# Patient Record
Sex: Male | Born: 1975 | Race: White | Hispanic: No | Marital: Single
Health system: Southern US, Academic
[De-identification: ages and names within clinical notes are randomized; demographics above are authoritative.]

## PROBLEM LIST (undated history)

## (undated) ENCOUNTER — Emergency Department: Disposition: A | Discharge status: Other Acute Inpatient Hospital | Payer: Medicare Other

## (undated) DIAGNOSIS — F909 Attention-deficit hyperactivity disorder, unspecified type: Secondary | ICD-10-CM

## (undated) DIAGNOSIS — M1990 Unspecified osteoarthritis, unspecified site: Secondary | ICD-10-CM

## (undated) DIAGNOSIS — F1910 Other psychoactive substance abuse, uncomplicated: Secondary | ICD-10-CM

## (undated) DIAGNOSIS — F419 Anxiety disorder, unspecified: Secondary | ICD-10-CM

## (undated) DIAGNOSIS — F445 Conversion disorder with seizures or convulsions: Secondary | ICD-10-CM

## (undated) DIAGNOSIS — F32A Depression, unspecified: Secondary | ICD-10-CM

## (undated) DIAGNOSIS — F329 Major depressive disorder, single episode, unspecified: Secondary | ICD-10-CM

## (undated) DIAGNOSIS — R569 Unspecified convulsions: Secondary | ICD-10-CM

## (undated) DIAGNOSIS — K279 Peptic ulcer, site unspecified, unspecified as acute or chronic, without hemorrhage or perforation: Principal | ICD-10-CM

## (undated) DIAGNOSIS — F4310 Post-traumatic stress disorder, unspecified: Secondary | ICD-10-CM

## (undated) DIAGNOSIS — F319 Bipolar disorder, unspecified: Secondary | ICD-10-CM

## (undated) DIAGNOSIS — K219 Gastro-esophageal reflux disease without esophagitis: Secondary | ICD-10-CM

## (undated) DIAGNOSIS — M9390 Osteochondropathy, unspecified of unspecified site: Secondary | ICD-10-CM

## (undated) DIAGNOSIS — M549 Dorsalgia, unspecified: Secondary | ICD-10-CM

## (undated) DIAGNOSIS — J449 Chronic obstructive pulmonary disease, unspecified: Secondary | ICD-10-CM

## (undated) HISTORY — PX: VASECTOMY: SHX75

## (undated) HISTORY — DX: Attention-deficit hyperactivity disorder, unspecified type: F90.9

## (undated) HISTORY — PX: TONSILLECTOMY: SUR1361

## (undated) HISTORY — PX: ANKLE SURGERY: SHX546

## (undated) HISTORY — DX: Bipolar disorder, unspecified: F31.9

## (undated) HISTORY — DX: Peptic ulcer, site unspecified, unspecified as acute or chronic, without hemorrhage or perforation: K27.9

## (undated) HISTORY — PX: ROTATOR CUFF REPAIR: SHX139

## (undated) HISTORY — PX: HERNIA REPAIR: SHX51

## (undated) HISTORY — PX: HX HERNIA REPAIR: SHX51

## (undated) HISTORY — DX: Depression, unspecified: F32.A

## (undated) HISTORY — DX: Dorsalgia, unspecified: M54.9

## (undated) HISTORY — DX: Chronic obstructive pulmonary disease, unspecified: J44.9

## (undated) HISTORY — DX: Anxiety disorder, unspecified: F41.9

## (undated) HISTORY — DX: Osteochondropathy, unspecified of unspecified site: M93.90

## (undated) HISTORY — PX: HX LUMBAR SPINE SURGERY: 2100001196

## (undated) HISTORY — PX: HX ROTATOR CUFF REPAIR: SHX139

---

## 2001-05-30 ENCOUNTER — Emergency Department (HOSPITAL_COMMUNITY): Payer: Self-pay | Admitting: EXTERNAL

## 2008-04-28 ENCOUNTER — Emergency Department (HOSPITAL_COMMUNITY): Admission: EM | Admit: 2008-04-28 | Discharge: 2008-04-28 | Payer: Self-pay | Admitting: Emergency Medicine

## 2008-05-21 ENCOUNTER — Emergency Department (HOSPITAL_COMMUNITY): Admission: EM | Admit: 2008-05-21 | Discharge: 2008-05-21 | Payer: Self-pay | Admitting: Emergency Medicine

## 2008-11-15 ENCOUNTER — Other Ambulatory Visit: Payer: Self-pay | Admitting: Emergency Medicine

## 2008-11-15 ENCOUNTER — Ambulatory Visit: Payer: Self-pay | Admitting: Psychiatry

## 2008-11-16 ENCOUNTER — Inpatient Hospital Stay (HOSPITAL_COMMUNITY): Admission: RE | Admit: 2008-11-16 | Discharge: 2008-11-18 | Payer: Self-pay | Admitting: Psychiatry

## 2009-01-24 ENCOUNTER — Emergency Department (HOSPITAL_COMMUNITY): Admission: EM | Admit: 2009-01-24 | Discharge: 2009-01-24 | Payer: Self-pay | Admitting: Emergency Medicine

## 2009-02-03 ENCOUNTER — Emergency Department (HOSPITAL_COMMUNITY): Admission: EM | Admit: 2009-02-03 | Discharge: 2009-02-03 | Payer: Self-pay | Admitting: Emergency Medicine

## 2010-05-09 ENCOUNTER — Emergency Department (HOSPITAL_COMMUNITY)
Admission: EM | Admit: 2010-05-09 | Discharge: 2010-05-09 | Payer: Self-pay | Source: Home / Self Care | Admitting: Emergency Medicine

## 2010-05-11 ENCOUNTER — Emergency Department (HOSPITAL_COMMUNITY)
Admission: EM | Admit: 2010-05-11 | Discharge: 2010-05-11 | Payer: Self-pay | Source: Home / Self Care | Admitting: Emergency Medicine

## 2010-06-06 ENCOUNTER — Emergency Department (HOSPITAL_COMMUNITY)
Admission: EM | Admit: 2010-06-06 | Discharge: 2010-06-06 | Disposition: A | Discharge status: Home / Self Care | Payer: Medicare Other | Attending: Emergency Medicine | Admitting: Emergency Medicine

## 2010-06-06 ENCOUNTER — Emergency Department (HOSPITAL_COMMUNITY): Payer: Medicare Other

## 2010-06-06 ENCOUNTER — Encounter (HOSPITAL_COMMUNITY): Payer: Self-pay | Admitting: Radiology

## 2010-06-06 DIAGNOSIS — M25519 Pain in unspecified shoulder: Secondary | ICD-10-CM | POA: Insufficient documentation

## 2010-06-06 DIAGNOSIS — F341 Dysthymic disorder: Secondary | ICD-10-CM | POA: Insufficient documentation

## 2010-06-06 DIAGNOSIS — Z79899 Other long term (current) drug therapy: Secondary | ICD-10-CM | POA: Insufficient documentation

## 2010-08-06 LAB — URINALYSIS, ROUTINE W REFLEX MICROSCOPIC
Nitrite: NEGATIVE
Specific Gravity, Urine: 1.01 (ref 1.005–1.030)
Urobilinogen, UA: 0.2 mg/dL (ref 0.0–1.0)

## 2010-08-06 LAB — BASIC METABOLIC PANEL
CO2: 27 mEq/L (ref 19–32)
Calcium: 9.1 mg/dL (ref 8.4–10.5)
Creatinine, Ser: 0.93 mg/dL (ref 0.4–1.5)
GFR calc Af Amer: >60 mL/min (ref >60)
GFR calc non Af Amer: >60 mL/min (ref >60)
Glucose, Bld: 93 mg/dL (ref 70–99)

## 2010-08-06 LAB — CBC
MCHC: 34.9 g/dL (ref 30.0–36.0)
RBC: 4.95 MIL/uL (ref 4.22–5.81)
RDW: 14.3 % (ref 11.5–15.5)

## 2010-08-06 LAB — DIFFERENTIAL
Basophils Absolute: 0.1 10*3/uL (ref 0.0–0.1)
Basophils Relative: 1 % (ref 0–1)
Neutro Abs: 5.7 10*3/uL (ref 1.7–7.7)
Neutrophils Relative %: 46 % (ref 43–77)

## 2010-08-06 LAB — RAPID URINE DRUG SCREEN, HOSP PERFORMED
Barbiturates: NOT DETECTED
Opiates: POSITIVE — AB

## 2010-10-21 ENCOUNTER — Emergency Department (HOSPITAL_COMMUNITY): Payer: Medicare Other

## 2010-10-21 ENCOUNTER — Emergency Department (HOSPITAL_COMMUNITY)
Admission: EM | Admit: 2010-10-21 | Discharge: 2010-10-21 | Disposition: A | Discharge status: Home / Self Care | Payer: Medicare Other | Attending: Emergency Medicine | Admitting: Emergency Medicine

## 2010-10-21 DIAGNOSIS — F411 Generalized anxiety disorder: Secondary | ICD-10-CM | POA: Insufficient documentation

## 2010-10-21 DIAGNOSIS — R071 Chest pain on breathing: Secondary | ICD-10-CM | POA: Insufficient documentation

## 2010-10-21 DIAGNOSIS — R55 Syncope and collapse: Secondary | ICD-10-CM | POA: Insufficient documentation

## 2010-10-21 DIAGNOSIS — F1910 Other psychoactive substance abuse, uncomplicated: Secondary | ICD-10-CM | POA: Insufficient documentation

## 2010-10-21 DIAGNOSIS — F329 Major depressive disorder, single episode, unspecified: Secondary | ICD-10-CM | POA: Insufficient documentation

## 2010-10-21 DIAGNOSIS — F3289 Other specified depressive episodes: Secondary | ICD-10-CM | POA: Insufficient documentation

## 2010-10-21 LAB — COMPREHENSIVE METABOLIC PANEL
Alkaline Phosphatase: 68 U/L (ref 39–117)
BUN: 14 mg/dL (ref 6–23)
CO2: 28 mEq/L (ref 19–32)
Chloride: 100 mEq/L (ref 96–112)
GFR calc Af Amer: >60 mL/min (ref >60)
GFR calc non Af Amer: >60 mL/min (ref >60)
Glucose, Bld: 88 mg/dL (ref 70–99)
Potassium: 3.7 mEq/L (ref 3.5–5.1)
Total Bilirubin: 0.2 mg/dL — ABNORMAL LOW (ref 0.3–1.2)
Total Protein: 7.4 g/dL (ref 6.0–8.3)

## 2010-10-21 LAB — CBC
HCT: 40.8 % (ref 39.0–52.0)
Hemoglobin: 14.3 g/dL (ref 13.0–17.0)
MCHC: 35 g/dL (ref 30.0–36.0)

## 2010-10-21 LAB — DIFFERENTIAL
Basophils Absolute: 0.1 10*3/uL (ref 0.0–0.1)
Lymphocytes Relative: 38 % (ref 12–46)
Monocytes Absolute: 0.9 10*3/uL (ref 0.1–1.0)
Neutro Abs: 6.1 10*3/uL (ref 1.7–7.7)

## 2010-10-21 LAB — URINALYSIS, ROUTINE W REFLEX MICROSCOPIC
Bilirubin Urine: NEGATIVE
Hgb urine dipstick: NEGATIVE
Nitrite: NEGATIVE
Specific Gravity, Urine: 1.015 (ref 1.005–1.030)
pH: 7 (ref 5.0–8.0)

## 2010-10-21 LAB — CK: Total CK: 156 U/L (ref 7–232)

## 2010-12-22 ENCOUNTER — Encounter (HOSPITAL_COMMUNITY): Payer: Self-pay

## 2010-12-22 ENCOUNTER — Emergency Department (HOSPITAL_COMMUNITY)
Admission: EM | Admit: 2010-12-22 | Discharge: 2010-12-22 | Disposition: A | Discharge status: Home / Self Care | Payer: Medicare Other | Attending: Emergency Medicine | Admitting: Emergency Medicine

## 2010-12-22 DIAGNOSIS — K859 Acute pancreatitis without necrosis or infection, unspecified: Secondary | ICD-10-CM | POA: Insufficient documentation

## 2010-12-22 LAB — DIFFERENTIAL
Basophils Relative: 1 % (ref 0–1)
Eosinophils Absolute: 0.4 10*3/uL (ref 0.0–0.7)
Neutrophils Relative %: 40 % — ABNORMAL LOW (ref 43–77)

## 2010-12-22 LAB — COMPREHENSIVE METABOLIC PANEL
ALT: 11 U/L (ref 0–53)
Albumin: 4.4 g/dL (ref 3.5–5.2)
Alkaline Phosphatase: 60 U/L (ref 39–117)
Calcium: 9.3 mg/dL (ref 8.4–10.5)
Potassium: 3.6 mEq/L (ref 3.5–5.1)
Sodium: 139 mEq/L (ref 135–145)
Total Protein: 7.3 g/dL (ref 6.0–8.3)

## 2010-12-22 LAB — CBC
MCH: 31.3 pg (ref 26.0–34.0)
MCHC: 34.6 g/dL (ref 30.0–36.0)
Platelets: 288 10*3/uL (ref 150–400)
RDW: 13.3 % (ref 11.5–15.5)

## 2010-12-22 LAB — URINALYSIS, ROUTINE W REFLEX MICROSCOPIC
Glucose, UA: NEGATIVE mg/dL
Leukocytes, UA: NEGATIVE
Specific Gravity, Urine: >1.03 — ABNORMAL HIGH (ref 1.005–1.030)
pH: 6 (ref 5.0–8.0)

## 2010-12-22 MED ORDER — ONDANSETRON HCL 4 MG/2ML IJ SOLN
4.0000 mg | Freq: Once | INTRAMUSCULAR | Status: AC
  Administered 2010-12-22: 4 mg via INTRAVENOUS
  Filled 2010-12-22: qty 2

## 2010-12-22 MED ORDER — HYDROMORPHONE HCL 1 MG/ML IJ SOLN: 1.0000 mg | Freq: Once | INTRAMUSCULAR | Status: DC

## 2010-12-22 MED ORDER — SODIUM CHLORIDE 0.9 % IV SOLN
Freq: Once | INTRAVENOUS | Status: AC
  Administered 2010-12-22: 12:00:00 via INTRAVENOUS

## 2010-12-22 MED ORDER — OXYCODONE-ACETAMINOPHEN 5-325 MG PO TABS: 2.0000 | ORAL_TABLET | ORAL | Status: DC | PRN

## 2010-12-22 MED ORDER — FAMOTIDINE IN NACL 20-0.9 MG/50ML-% IV SOLN
20.0000 mg | Freq: Once | INTRAVENOUS | Status: AC
  Administered 2010-12-22: 20 mg via INTRAVENOUS
  Filled 2010-12-22: qty 50

## 2010-12-22 MED ORDER — ONDANSETRON HCL 4 MG PO TABS: 4.0000 mg | ORAL_TABLET | Freq: Four times a day (QID) | ORAL | Status: AC

## 2010-12-22 MED ORDER — HYDROMORPHONE HCL 1 MG/ML IJ SOLN
1.0000 mg | Freq: Once | INTRAMUSCULAR | Status: AC
  Administered 2010-12-22: 1 mg via INTRAVENOUS
  Filled 2010-12-22: qty 1

## 2010-12-22 MED ORDER — HYDROMORPHONE HCL 1 MG/ML IJ SOLN
INTRAMUSCULAR | Status: AC
  Administered 2010-12-22: 1 mg
  Filled 2010-12-22: qty 1

## 2010-12-22 NOTE — ED Notes (Signed)
Pt c/o left upper quad pain since yesterday.  REports has vomited x 2 yesterday, none today.  Denies diarrhea, lbm was last night and was normal per pt.  Denies urinary symptoms.

## 2010-12-22 NOTE — ED Provider Notes (Signed)
Scribed for Toy Baker, MD, the patient was seen in room APA16A/APA16A . This chart was scribed by Ellie Lunch. This patient's care was started at 11:36 AM.   CSN: [id number] Arrival date & time: 12/22/2010 11:18 AM  Chief Complaint  Patient presents with  . Abdominal Pain   Patient is a 35 y.o. male presenting with abdominal pain.  Abdominal Pain The primary symptoms of the illness include abdominal pain, nausea and vomiting. The primary symptoms of the illness do not include fever, diarrhea or dysuria. The current episode started yesterday. The onset of the illness was sudden. The problem has not changed since onset. The abdominal pain began yesterday. The pain came on suddenly. The abdominal pain has been unchanged since its onset. The abdominal pain is located in the epigastric region. The abdominal pain does not radiate.  Nausea began yesterday.  The vomiting began yesterday.  Symptoms associated with the illness do not include frequency.   Darin Thomas is a 35 y.o. male who presents to the Emergency Department complaining of LUQ abdominal pain starting yesterday. PT was sitting on couch when he began to feel pain in his abdomen and nauseated. He threw up twice. Since onset pain has been constant, but nausea is now resolved. Pt denies fever, diarrhea, dysuria, or increase in frequency. Pt denies eating any strange foods or any recent sick contact. Pt denies a history of similar symptoms.  Pt is a smoker and drinks on special occassions.    History reviewed. No pertinent past medical history.  Past Surgical History  Procedure Date  . Ankle surgery   . Hernia repair   . Rotator cuff repair   . Vasectomy    MEDICATIONS:  Previous Medications   DIVALPROEX (DEPAKOTE) 500 MG 24 HR TABLET    Take 1,500 mg by mouth at bedtime.     NAPROXEN SODIUM (ANAPROX) 220 MG TABLET    Take 220 mg by mouth daily as needed. For pain    NUTRITIONAL SUPPLEMENTS (MELATONIN PO)    Take 1 tablet by  mouth daily as needed. For sleep    PAROXETINE (PAXIL) 40 MG TABLET    Take 40 mg by mouth at bedtime.     ZOLPIDEM (AMBIEN) 10 MG TABLET    Take 10 mg by mouth at bedtime as needed. For sleep      ALLERGIES:  Allergies as of 12/22/2010 - Review Complete 12/22/2010  Allergen Reaction Noted  . Latex  12/22/2010  . Tape  12/22/2010  . Ultram (tramadol hcl)  12/22/2010      Family History  Problem Relation Age of Onset  . Cancer Mother   . Cancer Father   . Stroke Father     History  Substance Use Topics  . Smoking status: Current Everyday Smoker  . Smokeless tobacco: Not on file  . Alcohol Use: No      Review of Systems  Constitutional: Negative for fever.  Gastrointestinal: Positive for nausea, vomiting and abdominal pain. Negative for diarrhea.  Genitourinary: Negative for dysuria, frequency and difficulty urinating.  All other systems reviewed and are negative.    Physical Exam  BP 134/79  Pulse 71  Temp(Src) 98 F (36.7 C) (Oral)  Resp 18  Ht 6' (1.829 m)  Wt 194 lb (87.998 kg)  BMI 26.31 kg/m2  SpO2 97%  Physical Exam  Nursing note and vitals reviewed. Constitutional: He is oriented to person, place, and time. He appears well-developed and well-nourished.  HENT:  Head:  Normocephalic and atraumatic.  Eyes: EOM are normal.  Neck: Neck supple.  Cardiovascular: Normal rate, regular rhythm and normal heart sounds.   Pulmonary/Chest: Effort normal and breath sounds normal.  Abdominal: Soft. He exhibits no mass. There is tenderness (Pain to palpation LUQ, LLQ). There is no rebound and no guarding.  Musculoskeletal: Normal range of motion.  Neurological: He is alert and oriented to person, place, and time.  Skin: Skin is warm and dry.  Psychiatric: He has a normal mood and affect. Judgment normal.   OTHER DATA REVIEWED: Nursing notes, vital signs, and past medical records reviewed.   DIAGNOSTIC STUDIES: Oxygen Saturation is 97% on room air, normal by my  interpretation.     LABS / RADIOLOGY:  Results for orders placed during the hospital encounter of 12/22/10  CBC      Component Value Range   WBC 9.0  4.0 - 10.5 (K/uL)   RBC 4.51  4.22 - 5.81 (MIL/uL)   Hemoglobin 14.1  13.0 - 17.0 (g/dL)   HCT 16.1  09.6 - 04.5 (%)   MCV 90.2  78.0 - 100.0 (fL)   MCH 31.3  26.0 - 34.0 (pg)   MCHC 34.6  30.0 - 36.0 (g/dL)   RDW 40.9  81.1 - 91.4 (%)   Platelets 288  150 - 400 (K/uL)  DIFFERENTIAL      Component Value Range   Neutrophils Relative 40 (*) 43 - 77 (%)   Neutro Abs 3.6  1.7 - 7.7 (K/uL)   Lymphocytes Relative 49 (*) 12 - 46 (%)   Lymphs Abs 4.4 (*) 0.7 - 4.0 (K/uL)   Monocytes Relative 6  3 - 12 (%)   Monocytes Absolute 0.6  0.1 - 1.0 (K/uL)   Eosinophils Relative 4  0 - 5 (%)   Eosinophils Absolute 0.4  0.0 - 0.7 (K/uL)   Basophils Relative 1  0 - 1 (%)   Basophils Absolute 0.1  0.0 - 0.1 (K/uL)  COMPREHENSIVE METABOLIC PANEL      Component Value Range   Sodium 139  135 - 145 (mEq/L)   Potassium 3.6  3.5 - 5.1 (mEq/L)   Chloride 103  96 - 112 (mEq/L)   CO2 26  19 - 32 (mEq/L)   Glucose, Bld 107 (*) 70 - 99 (mg/dL)   BUN 13  6 - 23 (mg/dL)   Creatinine, Ser 7.82  0.50 - 1.35 (mg/dL)   Calcium 9.3  8.4 - 95.6 (mg/dL)   Total Protein 7.3  6.0 - 8.3 (g/dL)   Albumin 4.4  3.5 - 5.2 (g/dL)   AST 12  0 - 37 (U/L)   ALT 11  0 - 53 (U/L)   Alkaline Phosphatase 60  39 - 117 (U/L)   Total Bilirubin 0.2 (*) 0.3 - 1.2 (mg/dL)   GFR calc non Af Amer >60  >60 (mL/min)   GFR calc Af Amer >60  >60 (mL/min)  URINALYSIS, ROUTINE W REFLEX MICROSCOPIC      Component Value Range   Color, Urine YELLOW  YELLOW    Appearance CLEAR  CLEAR    Specific Gravity, Urine >1.030 (*) 1.005 - 1.030    pH 6.0  5.0 - 8.0    Glucose, UA NEGATIVE  NEGATIVE (mg/dL)   Hgb urine dipstick NEGATIVE  NEGATIVE    Bilirubin Urine SMALL (*) NEGATIVE    Ketones, ur TRACE (*) NEGATIVE (mg/dL)   Protein, ur NEGATIVE  NEGATIVE (mg/dL)   Urobilinogen, UA 0.2  0.0 -  1.0 (mg/dL)   Nitrite NEGATIVE  NEGATIVE    Leukocytes, UA NEGATIVE  NEGATIVE   LIPASE, BLOOD      Component Value Range   Lipase 231 (*) 11 - 59 (U/L)     MDM:  Pt given iv fluids and pain meds, feels better, abd exam repeat and imporved, no acute abd signs noted 2:42 PM Pt offered admission and wants to go home due to his birthday being tomorrow, will return if worse   MEDICATIONS GIVEN IN THE E.D.  Medications  PARoxetine (PAXIL) 40 MG tablet (not administered)  zolpidem (AMBIEN) 10 MG tablet (not administered)  divalproex (DEPAKOTE) 500 MG 24 hr tablet (not administered)  naproxen sodium (ANAPROX) 220 MG tablet (not administered)  Nutritional Supplements (MELATONIN PO) (not administered)  ondansetron (ZOFRAN) injection 4 mg (not administered)  0.9 %  sodium chloride infusion (not administered)  famotidine (PEPCID) IVPB 20 mg (not administered)    DISCHARGE MEDICATIONS: New Prescriptions   No medications on file    Procedures        Toy Baker, MD 12/22/10 1442

## 2010-12-24 ENCOUNTER — Emergency Department (HOSPITAL_COMMUNITY): Payer: Medicare Other

## 2010-12-24 ENCOUNTER — Encounter (HOSPITAL_COMMUNITY): Payer: Self-pay

## 2010-12-24 ENCOUNTER — Emergency Department (HOSPITAL_COMMUNITY)
Admission: EM | Admit: 2010-12-24 | Discharge: 2010-12-24 | Disposition: A | Discharge status: Home / Self Care | Payer: Medicare Other | Attending: Emergency Medicine | Admitting: Emergency Medicine

## 2010-12-24 DIAGNOSIS — K859 Acute pancreatitis without necrosis or infection, unspecified: Secondary | ICD-10-CM

## 2010-12-24 DIAGNOSIS — Z91040 Latex allergy status: Secondary | ICD-10-CM | POA: Insufficient documentation

## 2010-12-24 DIAGNOSIS — F319 Bipolar disorder, unspecified: Secondary | ICD-10-CM | POA: Insufficient documentation

## 2010-12-24 DIAGNOSIS — R21 Rash and other nonspecific skin eruption: Secondary | ICD-10-CM | POA: Insufficient documentation

## 2010-12-24 DIAGNOSIS — F17200 Nicotine dependence, unspecified, uncomplicated: Secondary | ICD-10-CM | POA: Insufficient documentation

## 2010-12-24 DIAGNOSIS — K2970 Gastritis, unspecified, without bleeding: Secondary | ICD-10-CM | POA: Insufficient documentation

## 2010-12-24 DIAGNOSIS — R1012 Left upper quadrant pain: Secondary | ICD-10-CM

## 2010-12-24 DIAGNOSIS — Z9109 Other allergy status, other than to drugs and biological substances: Secondary | ICD-10-CM | POA: Insufficient documentation

## 2010-12-24 DIAGNOSIS — F341 Dysthymic disorder: Secondary | ICD-10-CM | POA: Insufficient documentation

## 2010-12-24 DIAGNOSIS — R1032 Left lower quadrant pain: Secondary | ICD-10-CM | POA: Insufficient documentation

## 2010-12-24 DIAGNOSIS — K219 Gastro-esophageal reflux disease without esophagitis: Secondary | ICD-10-CM | POA: Insufficient documentation

## 2010-12-24 DIAGNOSIS — K279 Peptic ulcer, site unspecified, unspecified as acute or chronic, without hemorrhage or perforation: Secondary | ICD-10-CM

## 2010-12-24 DIAGNOSIS — Z888 Allergy status to other drugs, medicaments and biological substances status: Secondary | ICD-10-CM | POA: Insufficient documentation

## 2010-12-24 LAB — CBC
HCT: 41.8 % (ref 39.0–52.0)
Hemoglobin: 14.4 g/dL (ref 13.0–17.0)
MCH: 31.2 pg (ref 26.0–34.0)
MCHC: 34.4 g/dL (ref 30.0–36.0)
MCV: 90.5 fL (ref 78.0–100.0)
RDW: 13.1 % (ref 11.5–15.5)

## 2010-12-24 LAB — HEPATIC FUNCTION PANEL
ALT: 7 U/L (ref 0–53)
AST: 12 U/L (ref 0–37)
Bilirubin, Direct: 0.1 mg/dL (ref 0.0–0.3)
Indirect Bilirubin: 0.2 mg/dL — ABNORMAL LOW (ref 0.3–0.9)
Total Bilirubin: 0.3 mg/dL (ref 0.3–1.2)

## 2010-12-24 LAB — DIFFERENTIAL
Basophils Absolute: 0.1 10*3/uL (ref 0.0–0.1)
Basophils Relative: 1 % (ref 0–1)
Eosinophils Relative: 6 % — ABNORMAL HIGH (ref 0–5)
Monocytes Absolute: 0.6 10*3/uL (ref 0.1–1.0)
Monocytes Relative: 6 % (ref 3–12)
Neutro Abs: 4.7 10*3/uL (ref 1.7–7.7)

## 2010-12-24 LAB — BASIC METABOLIC PANEL
BUN: 11 mg/dL (ref 6–23)
Calcium: 9.4 mg/dL (ref 8.4–10.5)
Chloride: 103 mEq/L (ref 96–112)
Creatinine, Ser: 0.81 mg/dL (ref 0.50–1.35)
GFR calc Af Amer: >60 mL/min (ref >60)

## 2010-12-24 LAB — URINALYSIS, ROUTINE W REFLEX MICROSCOPIC
Bilirubin Urine: NEGATIVE
Glucose, UA: NEGATIVE mg/dL
Ketones, ur: NEGATIVE mg/dL
Nitrite: NEGATIVE
Protein, ur: NEGATIVE mg/dL
pH: 7 (ref 5.0–8.0)

## 2010-12-24 MED ORDER — OMEPRAZOLE 20 MG PO CPDR: DELAYED_RELEASE_CAPSULE | ORAL | Status: DC

## 2010-12-24 MED ORDER — PROMETHAZINE HCL 25 MG PO TABS: 25.0000 mg | ORAL_TABLET | Freq: Four times a day (QID) | ORAL | Status: AC | PRN

## 2010-12-24 MED ORDER — MORPHINE SULFATE 4 MG/ML IJ SOLN
INTRAMUSCULAR | Status: AC
  Administered 2010-12-24: 4 mg
  Filled 2010-12-24: qty 1

## 2010-12-24 MED ORDER — IOHEXOL 300 MG/ML  SOLN
100.0000 mL | Freq: Once | INTRAMUSCULAR | Status: AC | PRN
  Administered 2010-12-24: 100 mL via INTRAVENOUS

## 2010-12-24 MED ORDER — ONDANSETRON HCL 4 MG/2ML IJ SOLN
4.0000 mg | INTRAMUSCULAR | Status: DC | PRN
  Administered 2010-12-24: 4 mg via INTRAVENOUS
  Filled 2010-12-24: qty 2

## 2010-12-24 MED ORDER — GI COCKTAIL ~~LOC~~
30.0000 mL | Freq: Once | ORAL | Status: AC
  Administered 2010-12-24: 30 mL via ORAL
  Filled 2010-12-24: qty 30

## 2010-12-24 MED ORDER — SODIUM CHLORIDE 0.9 % IV BOLUS (SEPSIS)
1500.0000 mL | Freq: Once | INTRAVENOUS | Status: AC
  Administered 2010-12-24: 1500 mL via INTRAVENOUS

## 2010-12-24 MED ORDER — OXYCODONE-ACETAMINOPHEN 5-325 MG PO TABS: 2.0000 | ORAL_TABLET | ORAL | Status: DC | PRN

## 2010-12-24 MED ORDER — MORPHINE SULFATE 10 MG/ML IJ SOLN
4.0000 mg | INTRAMUSCULAR | Status: DC | PRN
  Administered 2010-12-24 (×2): 4 mg via INTRAVENOUS
  Filled 2010-12-24 (×2): qty 1

## 2010-12-24 MED ORDER — FAMOTIDINE IN NACL 20-0.9 MG/50ML-% IV SOLN
20.0000 mg | Freq: Once | INTRAVENOUS | Status: AC
  Administered 2010-12-24: 20 mg via INTRAVENOUS
  Filled 2010-12-24: qty 50

## 2010-12-24 MED ORDER — SODIUM CHLORIDE 0.9 % IV SOLN
INTRAVENOUS | Status: DC
  Administered 2010-12-24: 15:00:00 via INTRAVENOUS

## 2010-12-24 NOTE — ED Provider Notes (Signed)
Scribed for Ward Givens, MD, the patient was seen in room APA10/APA10. This chart was scribed by [employer]. The patient's care started at 11:48  CSN: [id number] Arrival date & time: 12/24/2010 11:40 AM  Chief Complaint  Patient presents with  . Abdominal Pain   HPI  Darin Thomas is a 35 y.o. male who presents to the Emergency Department complaining of sharp constant abdominal pain, onset 12/21/2010. Patient localizes pain to the LLQ of the abdomen and states that pain does not radiate to the back. The pain is sharp in character.  Pain is aggravated by sitting up and with pressure from his "belt and pants". Associated symptoms include nausea and vomiting. He threw up 3x yesterday and none today. Patient denies diarrhea, fever. epigastric pain, weakness or dizziness upon standing. Patient has a recent history of similar pain, was seen in the ER 2 days ago and was diagnosed with acute pancreatitis. States he wasn't improved upon discharge but had to attend a family function. Reports smoking 1-1.5PPD and seldom drinks. There are no other associated symptoms and no other alleviating or aggravating factors.     Past Medical History  Diagnosis Date  . Pancreatitis   Depression Anxiety Bipolar     MEDICATIONS:  Previous Medications   DIPHENHYDRAMINE (BENADRYL) 25 MG CAPSULE    Take 25 mg by mouth at bedtime as needed. For sleep    DIVALPROEX (DEPAKOTE) 500 MG 24 HR TABLET    Take 1,500 mg by mouth at bedtime.     NAPROXEN SODIUM (ANAPROX) 220 MG TABLET    Take 220 mg by mouth daily as needed. For pain    NUTRITIONAL SUPPLEMENTS (MELATONIN PO)    Take 1 tablet by mouth at bedtime. For sleep   ONDANSETRON (ZOFRAN) 4 MG TABLET    Take 1 tablet (4 mg total) by mouth every 6 (six) hours.   OXYCODONE-ACETAMINOPHEN (PERCOCET) 5-325 MG PER TABLET    Take 2 tablets by mouth every 4 (four) hours as needed for pain.   PAROXETINE (PAXIL) 40 MG TABLET    Take 40 mg by mouth at bedtime.     ZOLPIDEM  (AMBIEN) 10 MG TABLET    Take 10 mg by mouth at bedtime. For sleep     ALLERGIES:  Allergies as of 12/24/2010 - Review Complete 12/24/2010  Allergen Reaction Noted  . Ultram (tramadol hcl) Other (See Comments) 12/22/2010  . Latex Rash 12/22/2010  . Tape Rash 12/22/2010     Past Surgical History  Procedure Date  . Ankle surgery   . Hernia repair   . Rotator cuff repair   . Vasectomy   Surgeries  Vasectomy, right rotator cuff repair, right ankle fx repair x 3, umbilical hernia repair   Family History  Problem Relation Age of Onset  . Cancer Mother   . Cancer Father   . Stroke Father     History  Substance Use Topics  . Smoking status: Current Everyday Smoker  . Smokeless tobacco: Not on file  . Alcohol Use: No   Lives with [facility]   Review of Systems  All other systems reviewed and are negative.    Physical Exam  BP 142/74  Pulse 77  Temp(Src) 98.1 F (36.7 C) (Oral)  Resp 22  Ht 6' (1.829 m)  Wt 194 lb (87.998 kg)  BMI 26.31 kg/m2  SpO2 100%  Physical Exam  Constitutional: Vital signs are normal. He appears well-developed and well-nourished.  HENT:  Head: Normocephalic and atraumatic.  Right Ear: External ear normal.  Left Ear: External ear normal.  Nose: Nose normal.  Mouth/Throat: Uvula is midline. Mucous membranes are dry. No oropharyngeal exudate or posterior oropharyngeal edema.  Eyes: Conjunctivae, EOM and lids are normal. Pupils are equal, round, and reactive to light. No scleral icterus.  Neck: Normal range of motion. Neck supple.  Cardiovascular: Normal rate, regular rhythm and normal heart sounds.   Pulmonary/Chest: Effort normal and breath sounds normal.  Abdominal: Soft. Normal appearance. He exhibits no distension and no mass. There is tenderness in the left upper quadrant. There is guarding. There is no rigidity, no rebound, no CVA tenderness, no tenderness at McBurney's point and negative Murphy's sign. No hernia.       Pt has small scar above  his umbilicus c/w prior repair  Musculoskeletal:       No acute abnormality seen  Neurological: He is alert. He has normal strength. No cranial nerve deficit or sensory deficit. He displays a negative Romberg sign.  Skin: Skin is warm and dry. Rash noted. No abrasion noted. He is not diaphoretic. There is pallor.  Psychiatric: His speech is normal and behavior is normal. Thought content normal. His affect is blunt.    ED Course  Procedures  OTHER DATA REVIEWED: Nursing notes, vital signs, and past medical records reviewed.    DIAGNOSTIC STUDIES: Oxygen Saturation is 100% on room air, normal by my interpretation.      LABS / RADIOLOGY:  Results for orders placed during the hospital encounter of 12/24/10  CBC      Component Value Range   WBC 10.0  4.0 - 10.5 (K/uL)   RBC 4.62  4.22 - 5.81 (MIL/uL)   Hemoglobin 14.4  13.0 - 17.0 (g/dL)   HCT 40.9  81.1 - 91.4 (%)   MCV 90.5  78.0 - 100.0 (fL)   MCH 31.2  26.0 - 34.0 (pg)   MCHC 34.4  30.0 - 36.0 (g/dL)   RDW 78.2  95.6 - 21.3 (%)   Platelets 274  150 - 400 (K/uL)  DIFFERENTIAL      Component Value Range   Neutrophils Relative 47  43 - 77 (%)   Neutro Abs 4.7  1.7 - 7.7 (K/uL)   Lymphocytes Relative 41  12 - 46 (%)   Lymphs Abs 4.1 (*) 0.7 - 4.0 (K/uL)   Monocytes Relative 6  3 - 12 (%)   Monocytes Absolute 0.6  0.1 - 1.0 (K/uL)   Eosinophils Relative 6 (*) 0 - 5 (%)   Eosinophils Absolute 0.6  0.0 - 0.7 (K/uL)   Basophils Relative 1  0 - 1 (%)   Basophils Absolute 0.1  0.0 - 0.1 (K/uL)  BASIC METABOLIC PANEL      Component Value Range   Sodium 140  135 - 145 (mEq/L)   Potassium 3.7  3.5 - 5.1 (mEq/L)   Chloride 103  96 - 112 (mEq/L)   CO2 29  19 - 32 (mEq/L)   Glucose, Bld 92  70 - 99 (mg/dL)   BUN 11  6 - 23 (mg/dL)   Creatinine, Ser 0.86  0.50 - 1.35 (mg/dL)   Calcium 9.4  8.4 - 57.8 (mg/dL)   GFR calc non Af Amer >60  >60 (mL/min)   GFR calc Af Amer >60  >60 (mL/min)  URINALYSIS, ROUTINE W REFLEX MICROSCOPIC        Component Value Range   Color, Urine STRAW (*) YELLOW    Appearance CLEAR  CLEAR  Specific Gravity, Urine <1.005 (*) 1.005 - 1.030    pH 7.0  5.0 - 8.0    Glucose, UA NEGATIVE  NEGATIVE (mg/dL)   Hgb urine dipstick TRACE (*) NEGATIVE    Bilirubin Urine NEGATIVE  NEGATIVE    Ketones, ur NEGATIVE  NEGATIVE (mg/dL)   Protein, ur NEGATIVE  NEGATIVE (mg/dL)   Urobilinogen, UA 0.2  0.0 - 1.0 (mg/dL)   Nitrite NEGATIVE  NEGATIVE    Leukocytes, UA NEGATIVE  NEGATIVE   HEPATIC FUNCTION PANEL      Component Value Range   Total Protein 7.1  6.0 - 8.3 (g/dL)   Albumin 4.3  3.5 - 5.2 (g/dL)   AST 12  0 - 37 (U/L)   ALT 7  0 - 53 (U/L)   Alkaline Phosphatase 59  39 - 117 (U/L)   Total Bilirubin 0.3  0.3 - 1.2 (mg/dL)   Bilirubin, Direct 0.1  0.0 - 0.3 (mg/dL)   Indirect Bilirubin 0.2 (*) 0.3 - 0.9 (mg/dL)  LIPASE, BLOOD      Component Value Range   Lipase 27  11 - 59 (U/L)  URINE MICROSCOPIC-ADD ON      Component Value Range   RBC / HPF 0-2  <3 (RBC/hpf)     Ct Abdomen Pelvis W Contrast  12/24/2010  *RADIOLOGY REPORT*  Clinical Data: Left lower quadrant abdominal pain.  Elevated lipase. Recently diagnosed pancreatitis.  Nausea and vomiting. Smoker.  CT ABDOMEN AND PELVIS WITH CONTRAST  Technique:  Multidetector CT imaging of the abdomen and pelvis was performed following the standard protocol during bolus administration of intravenous contrast.  Contrast: 100 ml Omnipaque-300  Comparison: None.  Findings: Normal appearing liver, spleen, pancreas, gallbladder, adrenal glands, kidneys, urinary bladder and prostate gland.  No gastrointestinal abnormalities or enlarged lymph nodes.  Normal appearing appendix in the upper right pelvis.  Very minimal dependent atelectasis at both lung bases.  Minimal lumbar and lower thoracic spine degenerative changes.  IMPRESSION: No acute abnormality.  Original Report Authenticated By: Darrol Angel, M.D.   ED COURSE / COORDINATION OF CARE: 12:33 - EDMD  examined patient and ordered the following Orders Placed This Encounter  Procedures  . CBC  . Differential  . Basic metabolic panel  . Urinalysis with microscopic    MDM:    Pt given IV fluids and IV pain and nausea meds. He states his pain is worse then his last ED visit but his lipase has improved and his AP CT is showing no acute changes. Will start on meds for GERD/gastritis.     4:20 PM Pt given IV pepcid and oral GI cocktail and he is drinking and feeling alittle better. Feels he can go home. Have discussed his pancreatitis has improved but he may have PUD from smoking and taking aleve OTC.      Ward Givens, MD 12/24/10 [phone]

## 2010-12-24 NOTE — ED Notes (Signed)
Patient with no complaints at this time. Respirations even and unlabored. Skin warm/dry. Discharge instructions reviewed with patient at this time. Patient given opportunity to voice concerns/ask questions. IV removed per policy and band-aid applied to site. Patient discharged at this time and left Emergency Department with steady gait.  

## 2010-12-24 NOTE — ED Notes (Signed)
MD at bedside. 

## 2010-12-24 NOTE — ED Notes (Signed)
Pt was seen here 2 days ago and was dx with pancreatitis.  Pt was told that he should be admitted, but pt did not want to stay b/c he wanted to be home for his daughters birthday.  Pt was sent home with pain meds but it isn't helping.

## 2010-12-26 ENCOUNTER — Emergency Department (HOSPITAL_COMMUNITY)
Admission: EM | Admit: 2010-12-26 | Discharge: 2010-12-26 | Disposition: A | Discharge status: Home / Self Care | Payer: Medicare Other | Attending: Emergency Medicine | Admitting: Emergency Medicine

## 2010-12-26 ENCOUNTER — Encounter (HOSPITAL_COMMUNITY): Payer: Self-pay

## 2010-12-26 DIAGNOSIS — R1012 Left upper quadrant pain: Secondary | ICD-10-CM | POA: Insufficient documentation

## 2010-12-26 DIAGNOSIS — R112 Nausea with vomiting, unspecified: Secondary | ICD-10-CM | POA: Insufficient documentation

## 2010-12-26 LAB — CBC
HCT: 46.1 % (ref 39.0–52.0)
MCH: 31.5 pg (ref 26.0–34.0)
MCHC: 34.9 g/dL (ref 30.0–36.0)
MCV: 90.2 fL (ref 78.0–100.0)
Platelets: 318 10*3/uL (ref 150–400)
RDW: 13 % (ref 11.5–15.5)

## 2010-12-26 LAB — DIFFERENTIAL
Basophils Absolute: 0.1 10*3/uL (ref 0.0–0.1)
Eosinophils Absolute: 0.6 10*3/uL (ref 0.0–0.7)
Eosinophils Relative: 5 % (ref 0–5)
Monocytes Absolute: 0.9 10*3/uL (ref 0.1–1.0)

## 2010-12-26 LAB — COMPREHENSIVE METABOLIC PANEL
ALT: 11 U/L (ref 0–53)
AST: 15 U/L (ref 0–37)
CO2: 31 mEq/L (ref 19–32)
Calcium: 9.9 mg/dL (ref 8.4–10.5)
Creatinine, Ser: 0.77 mg/dL (ref 0.50–1.35)
GFR calc Af Amer: >60 mL/min (ref >60)
GFR calc non Af Amer: >60 mL/min (ref >60)
Sodium: 140 mEq/L (ref 135–145)
Total Protein: 7.5 g/dL (ref 6.0–8.3)

## 2010-12-26 LAB — URINALYSIS, ROUTINE W REFLEX MICROSCOPIC
Glucose, UA: NEGATIVE mg/dL
Hgb urine dipstick: NEGATIVE
Leukocytes, UA: NEGATIVE
Protein, ur: NEGATIVE mg/dL
Specific Gravity, Urine: 1.015 (ref 1.005–1.030)
Urobilinogen, UA: 0.2 mg/dL (ref 0.0–1.0)

## 2010-12-26 LAB — RAPID URINE DRUG SCREEN, HOSP PERFORMED
Cocaine: NOT DETECTED
Opiates: NOT DETECTED

## 2010-12-26 MED ORDER — PROMETHAZINE HCL 25 MG/ML IJ SOLN
25.0000 mg | Freq: Once | INTRAMUSCULAR | Status: AC
  Administered 2010-12-26: 25 mg via INTRAVENOUS
  Filled 2010-12-26: qty 1

## 2010-12-26 MED ORDER — DICYCLOMINE HCL 10 MG/ML IM SOLN
20.0000 mg | Freq: Once | INTRAMUSCULAR | Status: AC
  Administered 2010-12-26: 20 mg via INTRAMUSCULAR
  Filled 2010-12-26: qty 2

## 2010-12-26 MED ORDER — PROMETHAZINE HCL 25 MG PO TABS: 25.0000 mg | ORAL_TABLET | Freq: Four times a day (QID) | ORAL | Status: AC | PRN

## 2010-12-26 MED ORDER — SODIUM CHLORIDE 0.9 % IV BOLUS (SEPSIS)
1500.0000 mL | Freq: Once | INTRAVENOUS | Status: AC
  Administered 2010-12-26: 1000 mL via INTRAVENOUS

## 2010-12-26 MED ORDER — SODIUM CHLORIDE 0.9 % IV SOLN: INTRAVENOUS | Status: DC

## 2010-12-26 NOTE — ED Notes (Signed)
Complain of abd pain and spitting up blood. States this is the third time here for same

## 2010-12-26 NOTE — ED Provider Notes (Cosign Needed)
Scribed for Ward Givens, MD, the patient was seen in room APA05/APA05 . This chart was scribed by Ellie Lunch. This patient's care was started at 3:09 PM.   CSN: [id number] Arrival date & time: 12/26/2010  2:17 PM  Chief Complaint  Patient presents with  . Abdominal Pain   HPI Darin Thomas is a 35 y.o. male who presents to the Emergency Department complaining of LUQ and LLQ abdominal pain for the past 4 days . Pt has been seen in ED 2 previous times for the same complaint . Pt reports last night around 00:30 he started to dry heave and had some streaks of blood and small amounts of blood. Pt reports nausea with gagging,  some weakness, and loss of apprettite. Denies dizziness, lightheadedness, cough, diarrhea, and fever. Pt states he is taking Prilosec and Zofran prescribed in previous ED visits with little relief. There are no other associated symptoms and no other alleviating or aggravating factors. Pt relates he has been stressed yesterday reguarding his exwife and his teenage daughter.  This patients first ED visit he had normal labs except for an elevated lipase. The second ED visit his labs had normalized and he had a normal AP CT scan done.     Past Medical History  Diagnosis Date  . Pancreatitis   . Peptic ulcer     Past Surgical History  Procedure Date  . Ankle surgery   . Hernia repair   . Rotator cuff repair   . Vasectomy    MEDICATIONS:  Previous Medications   DIPHENHYDRAMINE (BENADRYL) 25 MG CAPSULE    Take 25 mg by mouth at bedtime as needed. For sleep    DIVALPROEX (DEPAKOTE) 500 MG 24 HR TABLET    Take 1,500 mg by mouth at bedtime.     NUTRITIONAL SUPPLEMENTS (MELATONIN PO)    Take 1 tablet by mouth at bedtime. For sleep   OMEPRAZOLE (PRILOSEC) 20 MG CAPSULE    Take two pills twice a day for the next 2 weeks then once a day.   ONDANSETRON (ZOFRAN) 4 MG TABLET    Take 1 tablet (4 mg total) by mouth every 6 (six) hours.   OXYCODONE-ACETAMINOPHEN (PERCOCET) 5-325 MG  PER TABLET    Take 2 tablets by mouth every 4 (four) hours as needed for pain.   OXYCODONE-ACETAMINOPHEN (PERCOCET) 5-325 MG PER TABLET    Take 2 tablets by mouth every 4 (four) hours as needed for pain.   PAROXETINE (PAXIL) 40 MG TABLET    Take 40 mg by mouth at bedtime.     PROMETHAZINE (PHENERGAN) 25 MG TABLET    Take 1 tablet (25 mg total) by mouth every 6 (six) hours as needed for nausea.   ZOLPIDEM (AMBIEN) 10 MG TABLET    Take 10 mg by mouth at bedtime. For sleep     ALLERGIES:  Allergies as of 12/26/2010 - Review Complete 12/24/2010  Allergen Reaction Noted  . Ultram (tramadol hcl) Other (See Comments) 12/22/2010  . Latex Rash 12/22/2010  . Tape Rash 12/22/2010     Family History  Problem Relation Age of Onset  . Cancer Mother   . Cancer Father   . Stroke Father     History  Substance Use Topics  . Smoking status: Current Everyday Smoker  . Smokeless tobacco: Not on file  . Alcohol Use: No   Pt is unemployed, lives with his mother   Review of Systems  Constitutional: Positive for appetite change (decreased). Negative  for fever.  Respiratory: Negative for cough.   Gastrointestinal: Positive for nausea, vomiting and abdominal pain. Negative for diarrhea.  Neurological: Negative for dizziness and light-headedness.  All other systems reviewed and are negative.    Physical Exam  BP 122/69  Pulse 95  Temp(Src) 97.4 F (36.3 C) (Oral)  Resp 24  Ht 6' (1.829 m)  Wt 197 lb (89.359 kg)  BMI 26.72 kg/m2  SpO2 99%  Physical Exam  Constitutional: He appears well-developed and well-nourished.  HENT:  Head: Normocephalic.  Eyes: Conjunctivae and EOM are normal. Pupils are equal, round, and reactive to light.  Neck: Normal range of motion. Neck supple.  Cardiovascular: Normal rate, regular rhythm and normal heart sounds.   Pulmonary/Chest: Effort normal and breath sounds normal.  Abdominal: Soft. Normal appearance. There is tenderness in the left upper quadrant.  There is no rebound and no guarding.  Musculoskeletal:       Pt has no acute abnormality.   Neurological: He is alert. He has normal strength. No cranial nerve deficit.  Skin: Skin is warm, dry and intact. No rash noted.  Psychiatric: His speech is normal and behavior is normal. Thought content normal. His affect is blunt.    Procedures  OTHER DATA REVIEWED: Nursing notes, vital signs, and past medical records reviewed.   DIAGNOSTIC STUDIES: Oxygen Saturation is 99% on room air, normal by my interpretation.    LABS: Results for orders placed during the hospital encounter of 12/26/10  CBC      Component Value Range   WBC 12.7 (*) 4.0 - 10.5 (K/uL)   RBC 5.11  4.22 - 5.81 (MIL/uL)   Hemoglobin 16.1  13.0 - 17.0 (g/dL)   HCT 16.1  09.6 - 04.5 (%)   MCV 90.2  78.0 - 100.0 (fL)   MCH 31.5  26.0 - 34.0 (pg)   MCHC 34.9  30.0 - 36.0 (g/dL)   RDW 40.9  81.1 - 91.4 (%)   Platelets 318  150 - 400 (K/uL)  DIFFERENTIAL      Component Value Range   Neutrophils Relative 48  43 - 77 (%)   Neutro Abs 6.1  1.7 - 7.7 (K/uL)   Lymphocytes Relative 39  12 - 46 (%)   Lymphs Abs 5.0 (*) 0.7 - 4.0 (K/uL)   Monocytes Relative 7  3 - 12 (%)   Monocytes Absolute 0.9  0.1 - 1.0 (K/uL)   Eosinophils Relative 5  0 - 5 (%)   Eosinophils Absolute 0.6  0.0 - 0.7 (K/uL)   Basophils Relative 1  0 - 1 (%)   Basophils Absolute 0.1  0.0 - 0.1 (K/uL)  COMPREHENSIVE METABOLIC PANEL      Component Value Range   Sodium 140  135 - 145 (mEq/L)   Potassium 3.9  3.5 - 5.1 (mEq/L)   Chloride 99  96 - 112 (mEq/L)   CO2 31  19 - 32 (mEq/L)   Glucose, Bld 94  70 - 99 (mg/dL)   BUN 11  6 - 23 (mg/dL)   Creatinine, Ser 7.82  0.50 - 1.35 (mg/dL)   Calcium 9.9  8.4 - 95.6 (mg/dL)   Total Protein 7.5  6.0 - 8.3 (g/dL)   Albumin 4.5  3.5 - 5.2 (g/dL)   AST 15  0 - 37 (U/L)   ALT 11  0 - 53 (U/L)   Alkaline Phosphatase 62  39 - 117 (U/L)   Total Bilirubin 0.2 (*) 0.3 - 1.2 (mg/dL)   GFR  calc non Af Amer >60  >60  (mL/min)   GFR calc Af Amer >60  >60 (mL/min)  LIPASE, BLOOD      Component Value Range   Lipase 42  11 - 59 (U/L)  URINALYSIS, ROUTINE W REFLEX MICROSCOPIC      Component Value Range   Color, Urine YELLOW  YELLOW    Appearance CLEAR  CLEAR    Specific Gravity, Urine 1.015  1.005 - 1.030    pH 6.5  5.0 - 8.0    Glucose, UA NEGATIVE  NEGATIVE (mg/dL)   Hgb urine dipstick NEGATIVE  NEGATIVE    Bilirubin Urine NEGATIVE  NEGATIVE    Ketones, ur NEGATIVE  NEGATIVE (mg/dL)   Protein, ur NEGATIVE  NEGATIVE (mg/dL)   Urobilinogen, UA 0.2  0.0 - 1.0 (mg/dL)   Nitrite NEGATIVE  NEGATIVE    Leukocytes, UA NEGATIVE  NEGATIVE   URINE RAPID DRUG SCREEN (HOSP PERFORMED)      Component Value Range   Opiates NONE DETECTED  NONE DETECTED    Cocaine NONE DETECTED  NONE DETECTED    Benzodiazepines NONE DETECTED  NONE DETECTED    Amphetamines NONE DETECTED  NONE DETECTED    Tetrahydrocannabinol POSITIVE (*) NONE DETECTED    Barbiturates NONE DETECTED  NONE DETECTED    Labs normal, urine not concentrated, so not dehydrated. His elevated WBC is c/w vomiting/gagging.  ED COURSE / COORDINATION OF CARE: Pt given IV fluids and IM bentyl which he states didn't help his pain, but he was also asking the nurse for food to eat. Have discussed using more of his phenergan, has only used 2 in 2 days to stop the dry heaves.    MDM:  MEDICATIONS GIVEN IN THE E.D.  Medications  0.9 %  sodium chloride infusion (not administered)  promethazine (PHENERGAN) 25 MG tablet (not administered)  sodium chloride 0.9 % bolus 1,500 mL (1000 mL Intravenous Given 12/26/10 1624)  dicyclomine (BENTYL) injection 20 mg (20 mg Intramuscular Given 12/26/10 1600)  promethazine (PHENERGAN) injection 25 mg (25 mg Intravenous Given 12/26/10 1626)    DISCHARGE MEDICATIONS: New Prescriptions   PROMETHAZINE (PHENERGAN) 25 MG TABLET    Take 1 tablet (25 mg total) by mouth every 6 (six) hours as needed for nausea.    I personally  performed the services described in this documentation, which was scribed in my presence. The recorded information has been reviewed and considered. Devoria Albe, MD, FACEP       Ward Givens, MD 12/26/10 [phone]

## 2010-12-26 NOTE — ED Notes (Signed)
Pt given sprite to drink. 

## 2011-01-05 ENCOUNTER — Other Ambulatory Visit: Payer: Self-pay

## 2011-01-05 ENCOUNTER — Encounter (HOSPITAL_COMMUNITY): Payer: Self-pay | Admitting: *Deleted

## 2011-01-05 ENCOUNTER — Emergency Department (HOSPITAL_COMMUNITY)
Admission: EM | Admit: 2011-01-05 | Discharge: 2011-01-05 | Disposition: A | Discharge status: Home / Self Care | Payer: Medicare Other | Attending: Emergency Medicine | Admitting: Emergency Medicine

## 2011-01-05 ENCOUNTER — Emergency Department (HOSPITAL_COMMUNITY): Payer: Medicare Other

## 2011-01-05 DIAGNOSIS — F17200 Nicotine dependence, unspecified, uncomplicated: Secondary | ICD-10-CM | POA: Insufficient documentation

## 2011-01-05 DIAGNOSIS — S93409A Sprain of unspecified ligament of unspecified ankle, initial encounter: Secondary | ICD-10-CM | POA: Insufficient documentation

## 2011-01-05 DIAGNOSIS — T391X1A Poisoning by 4-Aminophenol derivatives, accidental (unintentional), initial encounter: Secondary | ICD-10-CM | POA: Insufficient documentation

## 2011-01-05 DIAGNOSIS — W172XXA Fall into hole, initial encounter: Secondary | ICD-10-CM | POA: Insufficient documentation

## 2011-01-05 NOTE — ED Provider Notes (Signed)
History     CSN: [id number] Arrival date & time: 01/05/2011  6:48 PM Pt seen at 1915 Chief Complaint  Patient presents with  . Drug Overdose   Patient is a 35 y.o. male presenting with Overdose. The history is provided by the patient and a relative.  Drug Overdose This is a new problem. Episode onset: today. The problem has not changed since onset.Pertinent negatives include no chest pain. The symptoms are aggravated by nothing. The symptoms are relieved by nothing.  Pt reports accidental apap overdose He reports he was working outside today, and he stepped in a hole and injured his right ankle He reports h/o chronic ankle pain due to previous injury/surgery He went into house and took about 4-5 tabs of apap at approximately 2pm He did not take any other meds, no etoh use.   He went to sleep, and woke up three hrs later and took several more apap.  He denies any other ingestants.  He denies SI.  He reports he took meds for his pain.  He lives with mother.  She confirms story.  She reports that the bottle was new, and there are only 16 missing.  The dosage was 500mg  apap each.     Past Medical History  Diagnosis Date  . Pancreatitis   . Peptic ulcer     Past Surgical History  Procedure Date  . Ankle surgery   . Hernia repair   . Rotator cuff repair   . Vasectomy     Family History  Problem Relation Age of Onset  . Cancer Mother   . Cancer Father   . Stroke Father     History  Substance Use Topics  . Smoking status: Current Everyday Smoker  . Smokeless tobacco: Not on file  . Alcohol Use: No      Review of Systems  Cardiovascular: Negative for chest pain.  All other systems reviewed and are negative.    Physical Exam  BP 132/85  Pulse 67  Temp(Src) 98.3 F (36.8 C) (Oral)  Resp 20  Ht 6' (1.829 m)  Wt 197 lb (89.359 kg)  BMI 26.72 kg/m2  SpO2 98%  Physical Exam  Constitutional: well developed, well nourished, no distress Head and Face:  normocephalic/atraumatic Eyes: EOMI/PERRL ENMT: mucous membranes moist Neck: supple, no meningeal signs Spine - no CTL tenderness CV: no murmur/rubs/gallops noted Lungs: clear to auscultation bilaterally Abd: soft, nontender Extremities: full ROM noted, pulses normal/equal, tenderness to right lateral malleolus, no deformity, no open skin, right achilles intact, no distal foot tenderness, no right prox fib tenderness No other extremity injury noted Neuro: awake/alert, no distress, appropriate for age, [id number], no lethargy is noted Skin:  Color normal.  Warm Psych: no abnormalities of mood noted  ED Course  Procedures  MDM Nursing notes reviewed and considered in documentation xrays reviewed and considered  ekg done by nursing for possible poisoning   Date: 01/05/2011  Rate: 61  Rhythm: normal sinus rhythm  QRS Axis: normal  Intervals: normal  ST/T Wave abnormalities: normal  Conduction Disutrbances:none  Narrative Interpretation:   Old EKG Reviewed: none available    Pt did not take a toxic amount of apap.  I spoke to Poison center to report this and confirmed that this is not a toxic overdose and he does not require lab testing Pt did have recent rx for percocet per chart, but review of narcotic database shows those were given in august Pt denies intentional overdose, denies SI.  Mother  at bedside, confirms story.      Joya Gaskins, MD 01/05/11 2152

## 2011-01-05 NOTE — ED Notes (Signed)
Pt self ambulated out displayinf proper use of crutches stating no needs

## 2011-01-05 NOTE — ED Notes (Signed)
States he has been taking tylenol since 1400. States he took around 16 pills, states they were 500 mg each

## 2011-01-05 NOTE — ED Notes (Signed)
Pt taken to xray 

## 2011-01-09 ENCOUNTER — Ambulatory Visit (INDEPENDENT_AMBULATORY_CARE_PROVIDER_SITE_OTHER): Payer: Medicare Other | Admitting: Gastroenterology

## 2011-01-09 ENCOUNTER — Encounter: Payer: Self-pay | Admitting: Gastroenterology

## 2011-01-09 VITALS — BP 129/81 | HR 75 | Temp 98.2°F | Ht 72.0 in | Wt 191.8 lb

## 2011-01-09 DIAGNOSIS — K920 Hematemesis: Secondary | ICD-10-CM | POA: Insufficient documentation

## 2011-01-09 DIAGNOSIS — K859 Acute pancreatitis without necrosis or infection, unspecified: Secondary | ICD-10-CM | POA: Insufficient documentation

## 2011-01-09 DIAGNOSIS — R1012 Left upper quadrant pain: Secondary | ICD-10-CM | POA: Insufficient documentation

## 2011-01-09 MED ORDER — OXYCODONE-ACETAMINOPHEN 5-325 MG PO TABS: 1.0000 | ORAL_TABLET | Freq: Four times a day (QID) | ORAL | Status: DC | PRN

## 2011-01-09 NOTE — Assessment & Plan Note (Signed)
LUQ pain associated with n/v (hematemesis) dating back to 12/21/10. Lipase and WBC up initially which would suggest acute pancreatitis. Two days later CT of abd normal. Patient denies significant etoh consumption. GB in situ. No FH of pancreatitis, hypertrigylceridemia. Symptoms persistent three weeks. Lipase now normal.   Recheck labs, check trig level.  EGD for further evaluation of ongoing abd pain and hematemesis (likely M-W tear). EGD with deep sedation given h/o polypharmacy, chronic narcotic therapy.  I have discussed the risks, alternatives, benefits with regards to but not limited to the risk of reaction to medication, bleeding, infection, perforation and the patient is agreeable to proceed. Written consent to be obtained.  Short course of Percocet for pain management. Bland diet.

## 2011-01-09 NOTE — Progress Notes (Signed)
Primary Care Physician:  Forest Gleason, MD, MD  Primary Gastroenterologist:  Roetta Sessions, MD   Chief Complaint  Patient presents with  . Abdominal Pain    luq  . Nausea    HPI:  Darin Thomas is a 35 y.o. male here for further evaluation of abd pain and nausea. Symptoms began 12/21/10. Dry heaves, vomiting small amount of blood. 12/22/10 ED, diagnosed with acute pancreatitis based on lipase above 200. Refused abdmission due to the fact his birthday was the following day and he had plans with his daughter. Returned to ED on 12/24/10, lipase normal. LFTs normal on multiple checks. CT A/P unremarkable. ED MD question possibility of PUD per patient. Started on omeprazole, no noted improvement. Saw PCP on 12/25/10, labs normal including CBC, LFTs, lipase. Started on Cipro/Flagyl, and given Rocephin for unclear reasons. Patient seen again inED on 12/26/10, labs okay. Patient took half of the Flagyl, cipro. Rarely takes Aleve, tylenol. States he has been living off crackers.  Very seldom etoh use. Never heavy or daily drinker.  No heartburn. LUQ pain worse with meals. Sharp pain. Pain medication helps but he is out. No melena, brbpr, stools are dark and slimy, no constipation, diarrhea. No fever/chills. No dysuria. Frequent urination.    Current Outpatient Prescriptions  Medication Sig Dispense Refill  . diphenhydrAMINE (BENADRYL) 25 mg capsule Take 50 mg by mouth at bedtime as needed. For sleep      . divalproex (DEPAKOTE) 500 MG 24 hr tablet Take 1,500 mg by mouth at bedtime.        . Melatonin 3 MG TABS Take 9 mg by mouth at bedtime.        Marland Kitchen omeprazole (PRILOSEC) 20 MG capsule Take 20 mg by mouth daily as needed. For reflux       . PARoxetine (PAXIL) 40 MG tablet Take 40 mg by mouth at bedtime.        Marland Kitchen zolpidem (AMBIEN) 10 MG tablet Take 10 mg by mouth at bedtime. For sleep      . oxyCODONE-acetaminophen (ROXICET) 5-325 MG per tablet Take 1 tablet by mouth every 6 (six) hours as needed for pain.  20  tablet  0    Allergies as of 01/09/2011 - Review Complete 01/09/2011  Allergen Reaction Noted  . Ultram (tramadol hcl) Other (See Comments) 12/22/2010  . Adhesive (tape) Rash 12/22/2010  . Latex Rash 12/22/2010    Past Medical History  Diagnosis Date  . Pancreatitis 12/22/10    lipase above 200, CT 12/23/10 negative  . Bipolar disorder     Psychiatrist  . ADHD (attention deficit hyperactivity disorder)     Past Surgical History  Procedure Date  . Ankle surgery   . Hernia repair     umbilical  . Rotator cuff repair   . Vasectomy   . Tonsillectomy     Family History  Problem Relation Age of Onset  . Cancer Mother     malignant melanoma  . Cancer Father     lung, abestosis  . Stroke Father   . Bipolar disorder Mother   . Colon cancer Neg Hx     History   Social History  . Marital Status: Single    Spouse Name: N/A    Number of Children: 3  . Years of Education: N/A   Occupational History  . disbility    Social History Main Topics  . Smoking status: Current Everyday Smoker -- 1.5 packs/day    Types: Cigarettes  . Smokeless  tobacco: Not on file  . Alcohol Use: Yes     2-4 beers a month sometime no beers, denies h/o being heavy drinker  . Drug Use: Yes    Special: Marijuana     marijuana almost daily. cocaine, none in 2 months.  . Sexually Active: Not on file   Other Topics Concern  . Not on file   Social History Narrative   2 sons in [place]. Little to no contact.Daughter local, contact with.      ROS:  General: Negative for anorexia, weight loss, fever, chills, fatigue, weakness. Eyes: Negative for vision changes.  ENT: Negative for hoarseness, difficulty swallowing , nasal congestion. CV: Negative for chest pain, angina, palpitations, dyspnea on exertion, peripheral edema.  Respiratory: Negative for dyspnea at rest, dyspnea on exertion, cough, sputum, wheezing.  GI: See history of present illness. GU:  Negative for dysuria, hematuria,  urinary incontinence, nocturnal urination. See HPI.   MS: C/O chronic right ankle pain. Smokes marijuana daily to "cope". Negative for low back pain.  Derm: Negative for rash or itching.  Neuro: Negative for weakness, abnormal sensation, seizure, frequent headaches, memory loss, confusion.  Psych: Negative for anxiety, depression, suicidal ideation, hallucinations.  Endo: Negative for unusual weight change.  Heme: Negative for bruising or bleeding. Allergy: Negative for rash or hives.    Physical Examination:  BP 129/81  Pulse 75  Temp(Src) 98.2 F (36.8 C) (Temporal)  Ht 6' (1.829 m)  Wt 191 lb 12.8 oz (87 kg)  BMI 26.01 kg/m2   General: Well-nourished, well-developed in no acute distress. Multiple body piercings. Mom present. Head: Normocephalic, atraumatic.   Eyes: Conjunctiva pink, no icterus. Mouth: Oropharyngeal mucosa moist and pink , no lesions erythema or exudate. Neck: Supple without thyromegaly, masses, or lymphadenopathy.  Lungs: Clear to auscultation bilaterally.  Heart: Regular rate and rhythm, no murmurs rubs or gallops.  Abdomen: Bowel sounds are normal, moderate luq/epig tenderness, nondistended, no hepatosplenomegaly or masses, no abdominal bruits or    hernia , no rebound or guarding.   Rectal: Not performed. Extremities: No lower extremity edema. No clubbing or deformities.  Neuro: Alert and oriented x 4 , grossly normal neurologically.  Skin: Warm and dry, no rash or jaundice.   Psych: Alert and cooperative, normal mood and affect.  Labs: Lab Results  Component Value Date   LIPASE 42 12/26/2010   Lab Results  Component Value Date   ALT 11 12/26/2010   AST 15 12/26/2010   ALKPHOS 62 12/26/2010   BILITOT 0.2* 12/26/2010   Lab Results  Component Value Date   WBC 12.7* 12/26/2010   HGB 16.1 12/26/2010   HCT 46.1 12/26/2010   MCV 90.2 12/26/2010   PLT 318 12/26/2010   Lab Results  Component Value Date   CREATININE 0.77 12/26/2010   BUN 11 12/26/2010   NA  140 12/26/2010   K 3.9 12/26/2010   CL 99 12/26/2010   CO2 31 12/26/2010     Imaging Studies: Dg Ankle Complete Right  01/05/2011  *RADIOLOGY REPORT*  Clinical Data: Larey Seat going down stairs, prior ankle surgery  RIGHT ANKLE - COMPLETE 3+ VIEW  Comparison: Right ankle films of 05/11/2010  Findings: No acute fracture is seen.  There is significant degenerative change involving the right ankle joint with loss of joint space, sclerosis, and spurring.  Alignment is grossly normal.  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  No acute abnormality.  Original Report Authenticated By: Juline Patch, M.D.  Ct Abdomen Pelvis W Contrast  12/24/2010  *RADIOLOGY REPORT*  Clinical Data: Left lower quadrant abdominal pain.  Elevated lipase. Recently diagnosed pancreatitis.  Nausea and vomiting. Smoker.  CT ABDOMEN AND PELVIS WITH CONTRAST  Technique:  Multidetector CT imaging of the abdomen and pelvis was performed following the standard protocol during bolus administration of intravenous contrast.  Contrast: 100 ml Omnipaque-300  Comparison: None.  Findings: Normal appearing liver, spleen, pancreas, gallbladder, adrenal glands, kidneys, urinary bladder and prostate gland.  No gastrointestinal abnormalities or enlarged lymph nodes.  Normal appearing appendix in the upper right pelvis.  Very minimal dependent atelectasis at both lung bases.  Minimal lumbar and lower thoracic spine degenerative changes.  IMPRESSION: No acute abnormality.  Original Report Authenticated By: Darrol Angel, M.D.

## 2011-01-09 NOTE — Progress Notes (Signed)
Cc to PCP 

## 2011-01-09 NOTE — Patient Instructions (Signed)
Please have your lab work done as soon as possible. Continue omeprazole for your stomach. EGD as planned, see separate orders.

## 2011-01-09 NOTE — Assessment & Plan Note (Signed)
See LUQ pain assessment and plan.

## 2011-01-09 NOTE — Assessment & Plan Note (Signed)
See assessment and plan for luq pain.

## 2011-01-15 ENCOUNTER — Telehealth: Payer: Self-pay | Admitting: Gastroenterology

## 2011-01-15 NOTE — Telephone Encounter (Signed)
Pt did not have labs completed as requested at [facility]> Prior to providing any more narcotics, need labs checked as requested. Please inform him of this.

## 2011-01-15 NOTE — Telephone Encounter (Signed)
Pt called wanting a refill on his oxycodone- he stated the only has one left.

## 2011-01-16 ENCOUNTER — Telehealth: Payer: Self-pay | Admitting: Gastroenterology

## 2011-01-16 ENCOUNTER — Other Ambulatory Visit: Payer: Self-pay | Admitting: Gastroenterology

## 2011-01-16 DIAGNOSIS — R1012 Left upper quadrant pain: Secondary | ICD-10-CM

## 2011-01-16 MED ORDER — HYDROCODONE-ACETAMINOPHEN 5-500 MG PO TABS: 1.0000 | ORAL_TABLET | ORAL | Status: DC | PRN

## 2011-01-16 NOTE — Telephone Encounter (Signed)
Pt had labs drawn today, AS gave vicodin rx. Pt aware and informed him that this would be the last rx that she will write for.

## 2011-01-16 NOTE — Telephone Encounter (Signed)
Addressed. NO more narcotics after this prescription of Vicodin.

## 2011-01-16 NOTE — Telephone Encounter (Signed)
Pt called back wanting to know what we had decided about giving him more pain meds, I informed him that per Tobi Bastos, he needed to have his labs drawn before we could give him anything else- he stated he would get them done today then come by here afterwards to pick up a Rx- I told him to call before he came to make sure there was a Rx for him

## 2011-01-16 NOTE — Progress Notes (Signed)
Agree 

## 2011-01-16 NOTE — Progress Notes (Signed)
Hx of acute pancreatitis, resolved. Continued abdominal pain, set up for EGD. Percocet given at last visit for short-term. Pt called in wanting refill; however, he did not get his labs as requested from that visit. I asked pt to obtain updated labs for our review. He had labs drawn today and immediately presented to our office. They are still being processed.  Will give only ONE more short-term prescription of Vicodin. No more narcotics to be prescribed. Pt was informed of this by nurse.

## 2011-01-16 NOTE — Progress Notes (Signed)
  Verlon Au from [facility] lab called- pt there to have labs done. He ate about 30 minutes prior to going to lab and they want to know if he can still have lipid done. Spoke with KJ- pt will have to come back fasting for lipid. Rhetta Mura- she will go ahead and draw other labs and have pt come back for lipid when he is fasting.

## 2011-01-17 LAB — LIPASE: Lipase: 24 U/L (ref 0–75)

## 2011-01-17 LAB — CBC WITH DIFFERENTIAL/PLATELET
Eosinophils Absolute: 0.8 10*3/uL — ABNORMAL HIGH (ref 0.0–0.7)
Hemoglobin: 14.7 g/dL (ref 13.0–17.0)
Lymphocytes Relative: 44 % (ref 12–46)
Lymphs Abs: 4.4 10*3/uL — ABNORMAL HIGH (ref 0.7–4.0)
Monocytes Relative: 7 % (ref 3–12)
Neutro Abs: 4.1 10*3/uL (ref 1.7–7.7)
Neutrophils Relative %: 41 % — ABNORMAL LOW (ref 43–77)
Platelets: 318 10*3/uL (ref 150–400)
RBC: 4.63 MIL/uL (ref 4.22–5.81)
WBC: 10 10*3/uL (ref 4.0–10.5)

## 2011-01-17 LAB — COMPREHENSIVE METABOLIC PANEL
Albumin: 4.5 g/dL (ref 3.5–5.2)
BUN: 9 mg/dL (ref 6–23)
Calcium: 9.2 mg/dL (ref 8.4–10.5)
Chloride: 106 mEq/L (ref 96–112)
Creat: 0.94 mg/dL (ref 0.50–1.35)
Glucose, Bld: 77 mg/dL (ref 70–99)
Potassium: 4.1 mEq/L (ref 3.5–5.3)

## 2011-01-22 NOTE — Progress Notes (Signed)
Quick Note:  Labs look good. Mild elevation of EOS, ?significance. Triglyceride level not done as patient went to lab without fasting.  Let's have pt complete triglyceride level. ______

## 2011-01-29 DIAGNOSIS — K279 Peptic ulcer, site unspecified, unspecified as acute or chronic, without hemorrhage or perforation: Secondary | ICD-10-CM

## 2011-01-29 HISTORY — DX: Peptic ulcer, site unspecified, unspecified as acute or chronic, without hemorrhage or perforation: K27.9

## 2011-02-01 ENCOUNTER — Telehealth: Payer: Self-pay | Admitting: Gastroenterology

## 2011-02-01 ENCOUNTER — Encounter (HOSPITAL_COMMUNITY): Admission: RE | Admit: 2011-02-01 | Discharge: 2011-02-01 | Payer: Medicare Other | Source: Ambulatory Visit

## 2011-02-01 NOTE — Telephone Encounter (Signed)
Spoke w/ Selena Batten in Endo- pt misses preop interview- I called pt to follow up - he was not home per his mother- she will have him call me

## 2011-02-01 NOTE — Pre-Procedure Instructions (Signed)
Lab work from 01/09/11 is fine per Dr. Jayme Cloud. No new lab work needed for 02/08/11.

## 2011-02-02 LAB — BASIC METABOLIC PANEL
BUN: 7 mg/dL (ref 6–23)
GFR calc Af Amer: >60 mL/min (ref >60)
GFR calc non Af Amer: >60 mL/min (ref >60)
Potassium: 3.3 mEq/L — ABNORMAL LOW (ref 3.5–5.1)

## 2011-02-02 LAB — DIFFERENTIAL
Eosinophils Relative: 1 % (ref 0–5)
Lymphocytes Relative: 15 % (ref 12–46)
Lymphs Abs: 2.4 10*3/uL (ref 0.7–4.0)
Monocytes Relative: 5 % (ref 3–12)
Neutrophils Relative %: 78 % — ABNORMAL HIGH (ref 43–77)

## 2011-02-02 LAB — CBC
HCT: 44.5 % (ref 39.0–52.0)
Platelets: 293 10*3/uL (ref 150–400)
RBC: 4.79 MIL/uL (ref 4.22–5.81)
WBC: 16.2 10*3/uL — ABNORMAL HIGH (ref 4.0–10.5)

## 2011-02-02 LAB — RAPID URINE DRUG SCREEN, HOSP PERFORMED
Amphetamines: NOT DETECTED
Barbiturates: NOT DETECTED
Benzodiazepines: POSITIVE — AB
Opiates: NOT DETECTED

## 2011-02-02 LAB — ETHANOL: Alcohol, Ethyl (B): <5 mg/dL (ref 0–10)

## 2011-02-06 ENCOUNTER — Telehealth: Payer: Self-pay | Admitting: Gastroenterology

## 2011-02-06 NOTE — Telephone Encounter (Signed)
PT MISSED ORIGINAL PRE OP ON 10/04- RESCHEDULED FOR 10/10 @ 12:45- HE IS AWARE

## 2011-02-07 ENCOUNTER — Encounter (HOSPITAL_COMMUNITY)
Admission: RE | Admit: 2011-02-07 | Discharge: 2011-02-07 | Disposition: A | Discharge status: Home / Self Care | Payer: Medicare Other | Source: Ambulatory Visit | Attending: Internal Medicine | Admitting: Internal Medicine

## 2011-02-07 ENCOUNTER — Encounter (HOSPITAL_COMMUNITY): Payer: Self-pay

## 2011-02-07 HISTORY — DX: Major depressive disorder, single episode, unspecified: F32.9

## 2011-02-07 HISTORY — DX: Unspecified osteoarthritis, unspecified site: M19.90

## 2011-02-07 HISTORY — DX: Anxiety disorder, unspecified: F41.9

## 2011-02-07 HISTORY — DX: Depression, unspecified: F32.A

## 2011-02-07 NOTE — Patient Instructions (Signed)
[address] ALAMIN [address]CCU[address]ON  02/07/2011   Your procedure is scheduled on: 02/08/11  [facility] to [facility] [place] AM.  Call this number if you have problems the morning of surgery: [phone]   Remember:   Do not eat food:After Midnight.  Do not drink clear liquids: After Midnight.  Take these medicines the morning of surgery with A SIP OF WATER: vicodin, paxil, depakote, prilosec   Do not wear jewelry, make-up or nail polish.  Do not wear lotions, powders, or perfumes. You may wear deodorant.  Do not shave 48 hours prior to surgery.  Do not bring valuables to the hospital.  Contacts, dentures or bridgework may not be worn into surgery.  Leave suitcase in the car. After surgery it may be brought to your room.  For patients admitted to the hospital, checkout time is 11:00 AM the day of discharge.   Patients discharged the day of surgery will not be allowed to drive home.  Name and phone number of your driver: family  Special Instructions: N/A   Please read over the following fact sheets that you were given: Pain Booklet, Anesthesia Post-op Instructions and Care and Recovery After Surgery    PATIENT INSTRUCTIONS POST-ANESTHESIA  IMMEDIATELY FOLLOWING SURGERY:  Do not drive or operate machinery for the first twenty four hours after surgery.  Do not make any important decisions for twenty four hours after surgery or while taking narcotic pain medications or sedatives.  If you develop intractable nausea and vomiting or a severe headache please notify your doctor immediately.  FOLLOW-UP:  Please make an appointment with your surgeon as instructed. You do not need to follow up with anesthesia unless specifically instructed to do so.  WOUND CARE INSTRUCTIONS (if applicable):  Keep a dry clean dressing on the anesthesia/puncture wound site if there is drainage.  Once the wound has quit draining you may leave it open to air.  Generally you should leave the bandage intact for twenty four hours unless  there is drainage.  If the epidural site drains for more than 36-48 hours please call the anesthesia department.  QUESTIONS?:  Please feel free to call your physician or the hospital operator if you have any questions, and they will be happy to assist you.     [facility]est[facility] Department [address] [place] [phone]

## 2011-02-08 ENCOUNTER — Ambulatory Visit (HOSPITAL_COMMUNITY): Payer: Medicare Other | Admitting: Anesthesiology

## 2011-02-08 ENCOUNTER — Encounter (HOSPITAL_COMMUNITY): Payer: Self-pay | Admitting: Anesthesiology

## 2011-02-08 ENCOUNTER — Encounter (HOSPITAL_COMMUNITY)
Admission: RE | Disposition: A | Discharge status: Home / Self Care | Payer: Self-pay | Source: Ambulatory Visit | Attending: Internal Medicine

## 2011-02-08 ENCOUNTER — Other Ambulatory Visit: Payer: Self-pay | Admitting: Internal Medicine

## 2011-02-08 ENCOUNTER — Ambulatory Visit (HOSPITAL_COMMUNITY)
Admission: RE | Admit: 2011-02-08 | Discharge: 2011-02-08 | Disposition: A | Discharge status: Home / Self Care | Payer: Medicare Other | Source: Ambulatory Visit | Attending: Internal Medicine | Admitting: Internal Medicine

## 2011-02-08 ENCOUNTER — Encounter (HOSPITAL_COMMUNITY): Payer: Self-pay | Admitting: *Deleted

## 2011-02-08 DIAGNOSIS — D131 Benign neoplasm of stomach: Secondary | ICD-10-CM | POA: Insufficient documentation

## 2011-02-08 DIAGNOSIS — R112 Nausea with vomiting, unspecified: Secondary | ICD-10-CM | POA: Insufficient documentation

## 2011-02-08 DIAGNOSIS — K449 Diaphragmatic hernia without obstruction or gangrene: Secondary | ICD-10-CM

## 2011-02-08 DIAGNOSIS — R109 Unspecified abdominal pain: Secondary | ICD-10-CM | POA: Insufficient documentation

## 2011-02-08 DIAGNOSIS — K259 Gastric ulcer, unspecified as acute or chronic, without hemorrhage or perforation: Secondary | ICD-10-CM

## 2011-02-08 DIAGNOSIS — K2940 Chronic atrophic gastritis without bleeding: Secondary | ICD-10-CM | POA: Insufficient documentation

## 2011-02-08 HISTORY — PX: ESOPHAGOGASTRODUODENOSCOPY: SHX1529

## 2011-02-08 SURGERY — ESOPHAGOGASTRODUODENOSCOPY (EGD) WITH PROPOFOL
Anesthesia: Monitor Anesthesia Care

## 2011-02-08 MED ORDER — GLYCOPYRROLATE 0.2 MG/ML IJ SOLN
INTRAMUSCULAR | Status: AC
  Administered 2011-02-08: 0.2 mg via INTRAVENOUS
  Filled 2011-02-08: qty 1

## 2011-02-08 MED ORDER — MIDAZOLAM HCL 2 MG/2ML IJ SOLN
1.0000 mg | INTRAMUSCULAR | Status: DC | PRN
  Administered 2011-02-08 (×2): 2 mg via INTRAVENOUS

## 2011-02-08 MED ORDER — LACTATED RINGERS IV SOLN
INTRAVENOUS | Status: DC
  Administered 2011-02-08: 10:00:00 via INTRAVENOUS

## 2011-02-08 MED ORDER — FENTANYL CITRATE 0.05 MG/ML IJ SOLN
INTRAMUSCULAR | Status: DC | PRN
  Administered 2011-02-08: 50 ug via INTRAVENOUS

## 2011-02-08 MED ORDER — ONDANSETRON HCL 4 MG/2ML IJ SOLN
4.0000 mg | Freq: Once | INTRAMUSCULAR | Status: AC
  Administered 2011-02-08: 4 mg via INTRAVENOUS

## 2011-02-08 MED ORDER — STERILE WATER FOR IRRIGATION IR SOLN
Status: DC | PRN
  Administered 2011-02-08: 1000 mL

## 2011-02-08 MED ORDER — PROPOFOL 10 MG/ML IV EMUL
INTRAVENOUS | Status: DC | PRN
  Administered 2011-02-08: 40 ug/kg/min via INTRAVENOUS

## 2011-02-08 MED ORDER — ONDANSETRON HCL 4 MG/2ML IJ SOLN: 4.0000 mg | Freq: Once | INTRAMUSCULAR | Status: DC | PRN

## 2011-02-08 MED ORDER — FENTANYL CITRATE 0.05 MG/ML IJ SOLN
INTRAMUSCULAR | Status: AC
  Filled 2011-02-08: qty 2

## 2011-02-08 MED ORDER — LIDOCAINE HCL (PF) 1 % IJ SOLN
INTRAMUSCULAR | Status: AC
  Filled 2011-02-08: qty 5

## 2011-02-08 MED ORDER — MIDAZOLAM HCL 5 MG/5ML IJ SOLN
INTRAMUSCULAR | Status: DC | PRN
  Administered 2011-02-08: 2 mg via INTRAVENOUS

## 2011-02-08 MED ORDER — GLYCOPYRROLATE 0.2 MG/ML IJ SOLN
0.2000 mg | Freq: Once | INTRAMUSCULAR | Status: AC
  Administered 2011-02-08: 0.2 mg via INTRAVENOUS

## 2011-02-08 MED ORDER — FENTANYL CITRATE 0.05 MG/ML IJ SOLN: 25.0000 ug | INTRAMUSCULAR | Status: DC | PRN

## 2011-02-08 MED ORDER — OMEPRAZOLE 20 MG PO CPDR: 20.0000 mg | DELAYED_RELEASE_CAPSULE | Freq: Two times a day (BID) | ORAL | Status: DC

## 2011-02-08 MED ORDER — MIDAZOLAM HCL 2 MG/2ML IJ SOLN
INTRAMUSCULAR | Status: AC
  Filled 2011-02-08: qty 2

## 2011-02-08 MED ORDER — ONDANSETRON HCL 4 MG/2ML IJ SOLN
INTRAMUSCULAR | Status: AC
  Filled 2011-02-08: qty 2

## 2011-02-08 MED ORDER — PROPOFOL 10 MG/ML IV EMUL
INTRAVENOUS | Status: AC
  Filled 2011-02-08: qty 20

## 2011-02-08 MED ORDER — BUTAMBEN-TETRACAINE-BENZOCAINE 2-2-14 % EX AERO
1.0000 | INHALATION_SPRAY | Freq: Once | CUTANEOUS | Status: AC
  Administered 2011-02-08: 1 via TOPICAL
  Filled 2011-02-08: qty 56

## 2011-02-08 MED ORDER — STERILE WATER FOR IRRIGATION IR SOLN
Status: DC | PRN
  Administered 2011-02-08: 11:00:00

## 2011-02-08 SURGICAL SUPPLY — 18 items
BLOCK BITE 60FR ADLT L/F BLUE (MISCELLANEOUS) ×2 IMPLANT
DEVICE CLIP HEMOSTAT 235CM (CLIP) IMPLANT
ELECT REM PT RETURN 9FT ADLT (ELECTROSURGICAL)
ELECTRODE REM PT RTRN 9FT ADLT (ELECTROSURGICAL) IMPLANT
FLOOR PAD 36X40 (MISCELLANEOUS) ×2
FORCEP RJ3 GP 1.8X160 W-NEEDLE (CUTTING FORCEPS) IMPLANT
FORCEPS BIOP RAD 4 LRG CAP 4 (CUTTING FORCEPS) ×2 IMPLANT
MANIFOLD NEPTUNE WASTE (CANNULA) ×2 IMPLANT
NEEDLE SCLEROTHERAPY 25GX240 (NEEDLE) IMPLANT
PAD FLOOR 36X40 (MISCELLANEOUS) ×1 IMPLANT
PROBE APC STR FIRE (PROBE) IMPLANT
PROBE INJECTION GOLD (MISCELLANEOUS)
PROBE INJECTION GOLD 7FR (MISCELLANEOUS) IMPLANT
SNARE ROTATE MED OVAL 20MM (MISCELLANEOUS) IMPLANT
SYR 50ML LL SCALE MARK (SYRINGE) ×2 IMPLANT
TUBING ENDO SMARTCAP PENTAX (MISCELLANEOUS) ×2 IMPLANT
TUBING IRRIGATION ENDOGATOR (MISCELLANEOUS) ×2 IMPLANT
WATER STERILE IRR 1000ML POUR (IV SOLUTION) ×4 IMPLANT

## 2011-02-08 NOTE — H&P (Signed)
Tana Coast, PA  01/09/2011  2:24 PM  Signed Primary Care Physician:  Forest Gleason, MD, MD   Primary Gastroenterologist:  Roetta Sessions, MD      Chief Complaint   Patient presents with   .  Abdominal Pain       luq   .  Nausea      HPI:  Darin Thomas is a 35 y.o. male here for further evaluation of abd pain and nausea. Symptoms began 12/21/10. Dry heaves, vomiting small amount of blood. 12/22/10 ED, diagnosed with acute pancreatitis based on lipase above 200. Refused abdmission due to the fact his birthday was the following day and he had plans with his daughter. Returned to ED on 12/24/10, lipase normal. LFTs normal on multiple checks. CT A/P unremarkable. ED MD question possibility of PUD per patient. Started on omeprazole, no noted improvement. Saw PCP on 12/25/10, labs normal including CBC, LFTs, lipase. Started on Cipro/Flagyl, and given Rocephin for unclear reasons. Patient seen again inED on 12/26/10, labs okay. Patient took half of the Flagyl, cipro. Rarely takes Aleve, tylenol. States he has been living off crackers.   Very seldom etoh use. Never heavy or daily drinker.   No heartburn. LUQ pain worse with meals. Sharp pain. Pain medication helps but he is out. No melena, brbpr, stools are dark and slimy, no constipation, diarrhea. No fever/chills. No dysuria. Frequent urination.      Current Outpatient Prescriptions   Medication  Sig  Dispense  Refill   .  diphenhydrAMINE (BENADRYL) 25 mg capsule  Take 50 mg by mouth at bedtime as needed. For sleep         .  divalproex (DEPAKOTE) 500 MG 24 hr tablet  Take 1,500 mg by mouth at bedtime.           .  Melatonin 3 MG TABS  Take 9 mg by mouth at bedtime.           Marland Kitchen  omeprazole (PRILOSEC) 20 MG capsule  Take 20 mg by mouth daily as needed. For reflux          .  PARoxetine (PAXIL) 40 MG tablet  Take 40 mg by mouth at bedtime.           Marland Kitchen  zolpidem (AMBIEN) 10 MG tablet  Take 10 mg by mouth at bedtime. For sleep         .   oxyCODONE-acetaminophen (ROXICET) 5-325 MG per tablet  Take 1 tablet by mouth every 6 (six) hours as needed for pain.   20 tablet   0       Allergies as of 01/09/2011 - Review Complete 01/09/2011   Allergen  Reaction  Noted   .  Ultram (tramadol hcl)  Other (See Comments)  12/22/2010   .  Adhesive (tape)  Rash  12/22/2010   .  Latex  Rash  12/22/2010       Past Medical History   Diagnosis  Date   .  Pancreatitis  12/22/10       lipase above 200, CT 12/23/10 negative   .  Bipolar disorder         Psychiatrist   .  ADHD (attention deficit hyperactivity disorder)         Past Surgical History   Procedure  Date   .  Ankle surgery     .  Hernia repair         umbilical   .  Rotator cuff repair     .  Vasectomy     .  Tonsillectomy         Family History   Problem  Relation  Age of Onset   .  Cancer  Mother         malignant melanoma   .  Cancer  Father         lung, abestosis   .  Stroke  Father     .  Bipolar disorder  Mother     .  Colon cancer  Neg Hx         History       Social History   .  Marital Status:  Single       Spouse Name:  N/A       Number of Children:  3   .  Years of Education:  N/A       Occupational History   .  disbility         Social History Main Topics   .  Smoking status:  Current Everyday Smoker -- 1.5 packs/day       Types:  Cigarettes   .  Smokeless tobacco:  Not on file   .  Alcohol Use:  Yes         2-4 beers a month sometime no beers, denies h/o being heavy drinker   .  Drug Use:  Yes       Special:  Marijuana         marijuana almost daily. cocaine, none in 2 months.   .  Sexually Active:  Not on file       Other Topics  Concern   .  Not on file       Social History Narrative     2 sons in [place]. Little to no contact.Daughter local, contact with.        ROS:   General: Negative for anorexia, weight loss, fever, chills, fatigue, weakness. Eyes: Negative for vision changes.   ENT: Negative for hoarseness,  difficulty swallowing , nasal congestion. CV: Negative for chest pain, angina, palpitations, dyspnea on exertion, peripheral edema.   Respiratory: Negative for dyspnea at rest, dyspnea on exertion, cough, sputum, wheezing.   GI: See history of present illness. GU:  Negative for dysuria, hematuria, urinary incontinence, nocturnal urination. See HPI.    MS: C/O chronic right ankle pain. Smokes marijuana daily to "cope". Negative for low back pain.   Derm: Negative for rash or itching.   Neuro: Negative for weakness, abnormal sensation, seizure, frequent headaches, memory loss, confusion.   Psych: Negative for anxiety, depression, suicidal ideation, hallucinations.   Endo: Negative for unusual weight change.   Heme: Negative for bruising or bleeding. Allergy: Negative for rash or hives.     Physical Examination:   BP 129/81  Pulse 75  Temp(Src) 98.2 F (36.8 C) (Temporal)  Ht 6' (1.829 m)  Wt 191 lb 12.8 oz (87 kg)  BMI 26.01 kg/m2    General: Well-nourished, well-developed in no acute distress. Multiple body piercings. Mom present. Head: Normocephalic, atraumatic.    Eyes: Conjunctiva pink, no icterus. Mouth: Oropharyngeal mucosa moist and pink , no lesions erythema or exudate. Neck: Supple without thyromegaly, masses, or lymphadenopathy.   Lungs: Clear to auscultation bilaterally.   Heart: Regular rate and rhythm, no murmurs rubs or gallops.   Abdomen: Bowel sounds are normal, moderate luq/epig tenderness, nondistended, no hepatosplenomegaly or masses, no abdominal bruits or    hernia , no rebound or guarding.  Rectal: Not performed. Extremities: No lower extremity edema. No clubbing or deformities.   Neuro: Alert and oriented x 4 , grossly normal neurologically.   Skin: Warm and dry, no rash or jaundice.    Psych: Alert and cooperative, normal mood and affect.   Labs: Lab Results   Component  Value  Date     LIPASE  42  12/26/2010    Lab Results   Component  Value   Date     ALT  11  12/26/2010     AST  15  12/26/2010     ALKPHOS  62  12/26/2010     BILITOT  0.2*  12/26/2010    Lab Results   Component  Value  Date     WBC  12.7*  12/26/2010     HGB  16.1  12/26/2010     HCT  46.1  12/26/2010     MCV  90.2  12/26/2010     PLT  318  12/26/2010    Lab Results   Component  Value  Date     CREATININE  0.77  12/26/2010     BUN  11  12/26/2010     NA  140  12/26/2010     K  3.9  12/26/2010     CL  99  12/26/2010     CO2  31  12/26/2010        Imaging Studies: Dg Ankle Complete Right   01/05/2011  *RADIOLOGY REPORT*  Clinical Data: Larey Seat going down stairs, prior ankle surgery  RIGHT ANKLE - COMPLETE 3+ VIEW  Comparison: Right ankle films of 05/11/2010  Findings: No acute fracture is seen.  There is significant degenerative change involving the right ankle joint with loss of joint space, sclerosis, and spurring.  Alignment is grossly normal.  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  No acute abnormality.  Original Report Authenticated By: Juline Patch, M.D.    Ct Abdomen Pelvis W Contrast   12/24/2010  *RADIOLOGY REPORT*  Clinical Data: Left lower quadrant abdominal pain.  Elevated lipase. Recently diagnosed pancreatitis.  Nausea and vomiting. Smoker.  CT ABDOMEN AND PELVIS WITH CONTRAST  Technique:  Multidetector CT imaging of the abdomen and pelvis was performed following the standard protocol during bolus administration of intravenous contrast.  Contrast: 100 ml Omnipaque-300  Comparison: None.  Findings: Normal appearing liver, spleen, pancreas, gallbladder, adrenal glands, kidneys, urinary bladder and prostate gland.  No gastrointestinal abnormalities or enlarged lymph nodes.  Normal appearing appendix in the upper right pelvis.  Very minimal dependent atelectasis at both lung bases.  Minimal lumbar and lower thoracic spine degenerative changes.  IMPRESSION: No acute abnormality.  Original Report Authenticated By: Darrol Angel,  M.D.            Darin Thomas  01/09/2011  2:36 PM  Signed Cc to PCP  Tana Coast, PA  01/22/2011  8:22 AM  Signed Quick Note:   Labs look good. Mild elevation of EOS, ?significance. Triglyceride level not done as patient went to lab without fasting.   Let's have pt complete triglyceride level. ______        LUQ pain - Tana Coast, PA  01/09/2011  2:22 PM  Signed LUQ pain associated with n/v (hematemesis) dating back to 12/21/10. Lipase and WBC up initially which would suggest acute pancreatitis. Two days later CT of abd normal. Patient denies significant etoh consumption. GB in situ. No FH of pancreatitis, hypertrigylceridemia. Symptoms  persistent three weeks. Lipase now normal.    Recheck labs, check trig level.   EGD for further evaluation of ongoing abd pain and hematemesis (likely M-W tear). EGD with deep sedation given h/o polypharmacy, chronic narcotic therapy.  I have discussed the risks, alternatives, benefits with regards to but not limited to the risk of reaction to medication, bleeding, infection, perforation and the patient is agreeable to proceed. Written consent to be obtained.   Short course of Percocet for pain management. Bland diet.      Hematemesis - Tana Coast, PA  01/09/2011  2:23 PM  Signed See LUQ pain assessment and plan.  Acute pancreatitis - Tana Coast, PA  01/09/2011  2:23 PM  Signed See assessment and plan for luq pain.  I have seen and examined the patient prior to the procedure(s) today and reviewed the history and physical / consultation.  There have been no changes. After consideration of the risks, benefits, alternatives and imponderables, the patient has consented to the procedure(s).

## 2011-02-08 NOTE — Anesthesia Postprocedure Evaluation (Signed)
  Anesthesia Post-op Note  Patient: Darin Thomas  Procedure(s) Performed:  ESOPHAGOGASTRODUODENOSCOPY (EGD) WITH PROPOFOL - Procedure included gastric biopsies.  Patient Location: PACU  Anesthesia Type: MAC  Level of Consciousness: awake and alert   Airway and Oxygen Therapy: Patient Spontanous Breathing and Patient connected to face mask oxygen  Post-op Pain: none  Post-op Assessment: Post-op Vital signs reviewed  Post-op Vital Signs: Reviewed  Complications: No apparent anesthesia complications

## 2011-02-08 NOTE — Anesthesia Preprocedure Evaluation (Addendum)
Anesthesia Evaluation  Name, MR# and DOB Patient awake  General Assessment Comment  Reviewed: Allergy & Precautions, H&P , NPO status , Patient's Chart, lab work & pertinent test results  History of Anesthesia Complications Negative for: history of anesthetic complications  Airway Mallampati: I      Dental  (+) Edentulous Upper and Edentulous Lower   Pulmonary COPD (hx asbestosis)Current Smoker (chronic cough)    Pulmonary exam normal       Cardiovascular Regular Normal    Neuro/Psych PSYCHIATRIC DISORDERS (ADHD) Bipolar Disorder    GI/Hepatic   Endo/Other    Renal/GU      Musculoskeletal   Abdominal   Peds  Hematology   Anesthesia Other Findings   Reproductive/Obstetrics                           Anesthesia Physical Anesthesia Plan  ASA: III  Anesthesia Plan: MAC   Post-op Pain Management:    Induction:   Airway Management Planned: Nasal Cannula  Additional Equipment:   Intra-op Plan:   Post-operative Plan:   Informed Consent: I have reviewed the patients History and Physical, chart, labs and discussed the procedure including the risks, benefits and alternatives for the proposed anesthesia with the patient or authorized representative who has indicated his/her understanding and acceptance.     Plan Discussed with:   Anesthesia Plan Comments:         Anesthesia Quick Evaluation

## 2011-02-08 NOTE — Transfer of Care (Signed)
Immediate Anesthesia Transfer of Care Note  Patient: Darin Thomas  Procedure(s) Performed:  ESOPHAGOGASTRODUODENOSCOPY (EGD) WITH PROPOFOL  Patient Location: PACU  Anesthesia Type: MAC  Level of Consciousness: awake and oriented  Airway & Oxygen Therapy: Patient Spontanous Breathing  Post-op Assessment: Report given to PACU RN  Post vital signs: Reviewed  Complications: No apparent anesthesia complications

## 2011-02-20 ENCOUNTER — Encounter: Payer: Self-pay | Admitting: Internal Medicine

## 2011-02-23 ENCOUNTER — Telehealth: Payer: Self-pay | Admitting: Internal Medicine

## 2011-02-23 NOTE — Telephone Encounter (Signed)
Having bad abdominal pains on the left side/Nausea/vomiting/sneezing/denies fever please advise

## 2011-02-25 NOTE — Telephone Encounter (Signed)
Needs ov

## 2011-02-26 ENCOUNTER — Encounter: Payer: Self-pay | Admitting: Urgent Care

## 2011-02-26 ENCOUNTER — Ambulatory Visit (INDEPENDENT_AMBULATORY_CARE_PROVIDER_SITE_OTHER): Payer: Medicare Other | Admitting: Urgent Care

## 2011-02-26 ENCOUNTER — Emergency Department (HOSPITAL_COMMUNITY)
Admission: EM | Admit: 2011-02-26 | Discharge: 2011-02-26 | Disposition: A | Discharge status: Home / Self Care | Payer: Medicare Other

## 2011-02-26 VITALS — BP 118/66 | HR 73 | Temp 97.8°F | Ht 72.0 in | Wt 189.8 lb

## 2011-02-26 DIAGNOSIS — K279 Peptic ulcer, site unspecified, unspecified as acute or chronic, without hemorrhage or perforation: Secondary | ICD-10-CM

## 2011-02-26 NOTE — ED Notes (Signed)
Not in waiting room at this time.

## 2011-02-26 NOTE — Patient Instructions (Signed)
Continue omeprazole 20 mg before breakfast & dinner Please see your regular doctor about your chronic back pain Avoid Aspirin, Goodys, BC powders, Ibuprofen, Aleve You will need repeat EGD in Jan 2013.  Please keep appt.  Peptic Ulcers Ulcers are small, open craters or sores that develop in the lining of the stomach or the duodenum (the first part of the small intestine). The term peptic ulcer is used to describe both types of ulcers. There are a number of treatments that relieve the discomfort of ulcers. In most cases ulcers do heal.  CAUSES AND COMMON FEATURES OF PEPTIC ULCERS  Peptic ulcers occur only in areas of the digestive system that come in contact with digestive juices. These juices are secreted (given off) by the stomach. They include acid and an enzyme called pepsin that breaks down proteins. Many people with duodenal ulcers have too much digestive juice spilling down from the stomach. Most people with gastric (stomach) ulcers have normal or below normal amounts of stomach acid. Sometimes, when the mucous membrane (protective lining) of the stomach and duodenum does not protect well, this may add to the growth of ulcers. Duodenal ulcers often produces pain in a small area between the breastbone and navel. Pain varies from hunger pain to constant gnawing or burning sensations (feeling). Sometimes the pain is felt during sleep and may awaken the person in the middle of the night. Often the pain occurs two or three hours after eating, when the stomach is empty. Other common symptoms (problems) include overeating for pain relief. Eating relieves the pain of a duodenal ulcer. Gastric ulcer pain may be felt in the same place as the pain of a duodenal ulcer, or slightly higher up. There may also be sensations of feeling full, indigestion, and heartburn. Sometimes pain occurs when the stomach is full. This causes loss of appetite followed by weight loss. HOME CARE INSTRUCTIONS   Use of tobacco  products have been found to slow down the healing of an ulcer. STOP SMOKING.   Avoid alcohol, aspirin, and other inflammation (swelling and soreness) reducing drugs. These substances weaken the stomach lining.   Eat regular, nutritious meals.   Avoid foods that bother you.   Take medications and antacids as directed. Over-the-counter medications are used to neutralize stomach acid. Prescription medications reduce acid secretion, block acid production, or provide a protective coating over the ulcer. If a specific antacid was prescribed, do not switch brands without your caregiver's approval.  Surgery is usually not necessary. Diet and/or drug therapy usually is effective. Surgery may be necessary if perforation, obstruction due to scarring, or uncontrollable bleeding is found, or if severe pain is not otherwise controlled. SEEK IMMEDIATE MEDICAL CARE IF:  You see signs of bleeding. This includes vomiting fresh, bright red blood or passing bloody or tarry, black stools.   You suffer weakness, fatigue, or loss of consciousness. These symptoms can result from severe hemorrhaging (bleeding). Shock may result.   You have sudden, intense, severe abdominal (belly) pain. This is the first sign of a perforation. This would require immediate surgical treatment.   You have intense pain and continued vomiting. This could signal an obstruction of the digestive tract.  Document Released: 04/13/2000 Document Revised: 12/27/2010 Document Reviewed: 04/12/2008 [facility] Patient Information 2012 [place], [place].

## 2011-02-26 NOTE — Progress Notes (Signed)
Referring Provider: Forest Gleason, MD Primary Care Physician:  Forest Gleason, MD, MD Primary Gastroenterologist:  Dr. Jena Gauss  Chief Complaint  Patient presents with  . Abdominal Pain    mostly on the left side    HPI:  Darin Thomas is a 35 y.o. male here for follow up for abdominal pain, PUD& chronic gastritis.  Symptoms began 12/21/10 .  It was initially felt pt may have pancreatitis given elevated lipase, but CT findings did not confirm.  EGD showed PUD/gastric erosions.  Taking prilosec 20mg  BID.  Hx chronic back pain.  Sees Forest Gleason, NP for this.  Continues to have luq pain.  4/10, 10/10 @ worst.  Denies n/v.   Denies any lower GI symptoms including constipation, diarrhea, rectal bleeding, melena or weight loss.  12/24/10 CT ABDOMEN AND PELVIS WITH CONTRAST->NORMAL Lab Summary Latest Ref Rng 01/09/2011  Hemoglobin 13.0 - 17.0 g/dL 96.0  Hematocrit 45.4 - 52.0 % 41.9  White count 4.0 - 10.5 K/uL 10.0  Platelet count 150 - 400 K/uL 318  Sodium 135 - 145 mEq/L 139  Potassium 3.5 - 5.3 mEq/L 4.1  Calcium 8.4 - 10.5 mg/dL 9.2  Phosphorus  (None)  Creatinine 0.50 - 1.35 mg/dL 0.98  AST 0 - 37 U/L 12  Alk Phos 39 - 117 U/L 53  Bilirubin 0.3 - 1.2 mg/dL 0.3  Glucose 70 - 99 mg/dL 77  Cholesterol  (None)  HDL cholesterol  (None)  Triglycerides  (None)  LDL calc  (None)  LDL direct  (None)  Total protein 6.0 - 8.3 g/dL 6.6  Albumin 3.5 - 5.2 g/dL 4.5   Past Medical History  Diagnosis Date  . Arthritis     right ankle  . Anxiety   . Bipolar disorder     Psychiatrist  . ADHD (attention deficit hyperactivity disorder)   . Depression   . Bipolar disorder     Past Surgical History  Procedure Date  . Ankle surgery '09,'10,'11    right  . Hernia repair     umbilical  . Rotator cuff repair   . Vasectomy   . Tonsillectomy     Current Outpatient Prescriptions  Medication Sig Dispense Refill  . diphenhydrAMINE (BENADRYL) 25 mg capsule Take 50 mg by mouth at bedtime as needed. For  sleep      . divalproex (DEPAKOTE) 500 MG 24 hr tablet Take 1,500 mg by mouth at bedtime.        . Melatonin 3 MG TABS Take 9 mg by mouth at bedtime.        Marland Kitchen omeprazole (PRILOSEC) 20 MG capsule Take 1 capsule (20 mg total) by mouth 2 (two) times daily. For reflux  60 capsule  11  . PARoxetine (PAXIL) 40 MG tablet Take 40 mg by mouth at bedtime.        Marland Kitchen zolpidem (AMBIEN) 10 MG tablet Take 10 mg by mouth at bedtime. For sleep        Allergies as of 02/26/2011 - Review Complete 02/26/2011  Allergen Reaction Noted  . Ultram (tramadol hcl) Other (See Comments) 12/22/2010  . Adhesive (tape) Rash 12/22/2010  . Latex Rash 12/22/2010    Review of Systems: Gen: Denies any fever, chills, sweats, anorexia, fatigue, weakness, malaise, weight loss, and sleep disorder CV: Denies chest pain, angina, palpitations, syncope, orthopnea, PND, peripheral edema, and claudication. Resp: Denies dyspnea at rest, dyspnea with exercise, cough, sputum, wheezing, coughing up blood, and pleurisy. GI: Denies vomiting blood, jaundice, and fecal incontinence.  Denies dysphagia or odynophagia. Derm: Denies rash, itching, dry skin, hives, moles, warts, or unhealing ulcers.  Psych: Denies depression, anxiety, memory loss, suicidal ideation, hallucinations, paranoia, and confusion. Heme: Denies bruising, bleeding, and enlarged lymph nodes.  Physical Exam: BP 118/66  Pulse 73  Temp(Src) 97.8 F (36.6 C) (Temporal)  Ht 6' (1.829 m)  Wt 189 lb 12.8 oz (86.093 kg)  BMI 25.74 kg/m2 General:   Alert,  Well-developed, well-nourished, pleasant and cooperative in NAD Head:  Normocephalic and atraumatic. Eyes:  Sclera clear, no icterus.   Conjunctiva pink. Mouth:  No deformity or lesions, OP pink/moist. Neck:  Supple; no masses or thyromegaly. Heart:  Regular rate and rhythm; no murmurs, clicks, rubs,  or gallops. Abdomen:  Soft and nondistended. Pt grimaces & c/o tenderness prior to palpation, as well as with palpation.   Subjective pain seems out of proportion to objective findings.  + Carnett sign.  No masses, hepatosplenomegaly or hernias noted. Normal bowel sounds, without guarding, and without rebound.   Msk:  Symmetrical without gross deformities. Normal posture. Pulses:  Normal pulses noted. Extremities:  Without clubbing or edema. Neurologic:  Alert and  oriented x4;  grossly normal neurologically. Skin:  Intact without significant lesions or rashes. Cervical Nodes:  No significant cervical adenopathy. Psych:  Alert and cooperative. Normal mood and affect.

## 2011-02-26 NOTE — Telephone Encounter (Signed)
Seeing KJ 02-26-11

## 2011-02-26 NOTE — Assessment & Plan Note (Addendum)
Continue Prilosec 20mg  BID Pt reassured that abdominal exam was benign, CT without any explanation of pain.  Explained no narcotics given for PUD.  ?referred back pain.  Pt agrees w/ plan. EGD to verify healing PUD 05/2011 Avoid NSAIDs Call if pain worse or new symptoms. PUD literature

## 2011-02-26 NOTE — ED Notes (Signed)
Not in waiting room

## 2011-02-27 NOTE — Progress Notes (Signed)
Cc to PCP 

## 2011-05-04 ENCOUNTER — Emergency Department (HOSPITAL_COMMUNITY): Payer: Medicare Other

## 2011-05-04 ENCOUNTER — Other Ambulatory Visit: Payer: Self-pay

## 2011-05-04 ENCOUNTER — Emergency Department (HOSPITAL_COMMUNITY)
Admission: EM | Admit: 2011-05-04 | Discharge: 2011-05-04 | Disposition: A | Discharge status: Home / Self Care | Payer: Medicare Other | Attending: Emergency Medicine | Admitting: Emergency Medicine

## 2011-05-04 ENCOUNTER — Encounter (HOSPITAL_COMMUNITY): Payer: Self-pay | Admitting: *Deleted

## 2011-05-04 DIAGNOSIS — M19079 Primary osteoarthritis, unspecified ankle and foot: Secondary | ICD-10-CM | POA: Insufficient documentation

## 2011-05-04 DIAGNOSIS — R079 Chest pain, unspecified: Secondary | ICD-10-CM | POA: Insufficient documentation

## 2011-05-04 DIAGNOSIS — F17200 Nicotine dependence, unspecified, uncomplicated: Secondary | ICD-10-CM | POA: Insufficient documentation

## 2011-05-04 DIAGNOSIS — F909 Attention-deficit hyperactivity disorder, unspecified type: Secondary | ICD-10-CM | POA: Insufficient documentation

## 2011-05-04 DIAGNOSIS — F411 Generalized anxiety disorder: Secondary | ICD-10-CM | POA: Insufficient documentation

## 2011-05-04 DIAGNOSIS — F319 Bipolar disorder, unspecified: Secondary | ICD-10-CM | POA: Insufficient documentation

## 2011-05-04 LAB — POCT I-STAT TROPONIN I

## 2011-05-04 LAB — CBC
Hemoglobin: 14.8 g/dL (ref 13.0–17.0)
MCH: 31.5 pg (ref 26.0–34.0)
MCHC: 35 g/dL (ref 30.0–36.0)
Platelets: 268 10*3/uL (ref 150–400)
RDW: 13.2 % (ref 11.5–15.5)

## 2011-05-04 LAB — BASIC METABOLIC PANEL
CO2: 27 mEq/L (ref 19–32)
Calcium: 9.6 mg/dL (ref 8.4–10.5)
Creatinine, Ser: 0.88 mg/dL (ref 0.50–1.35)
GFR calc Af Amer: >90 mL/min (ref >90)
GFR calc non Af Amer: >90 mL/min (ref >90)
Sodium: 140 mEq/L (ref 135–145)

## 2011-05-04 LAB — D-DIMER, QUANTITATIVE: D-Dimer, Quant: <0.22 ug/mL-FEU (ref 0.00–0.48)

## 2011-05-04 LAB — TROPONIN I
Troponin I: <0.3 ng/mL (ref <0.30)
Troponin I: <0.3 ng/mL (ref <0.30)

## 2011-05-04 MED ORDER — SODIUM CHLORIDE 0.9 % IV SOLN
20.0000 mL | INTRAVENOUS | Status: DC
  Administered 2011-05-04: 20 mL via INTRAVENOUS

## 2011-05-04 MED ORDER — HYDROCODONE-ACETAMINOPHEN 5-325 MG PO TABS: 2.0000 | ORAL_TABLET | Freq: Four times a day (QID) | ORAL | Status: AC | PRN

## 2011-05-04 MED ORDER — ASPIRIN 81 MG PO CHEW
324.0000 mg | CHEWABLE_TABLET | Freq: Once | ORAL | Status: AC
  Administered 2011-05-04: 324 mg via ORAL
  Filled 2011-05-04: qty 4

## 2011-05-04 MED ORDER — MORPHINE SULFATE 4 MG/ML IJ SOLN
4.0000 mg | Freq: Once | INTRAMUSCULAR | Status: AC
Start: 2011-05-04 — End: 2011-05-04
  Administered 2011-05-04: 4 mg via INTRAVENOUS
  Filled 2011-05-04: qty 1

## 2011-05-04 MED ORDER — NITROGLYCERIN 0.4 MG SL SUBL: 0.4000 mg | SUBLINGUAL_TABLET | SUBLINGUAL | Status: DC | PRN

## 2011-05-04 MED ORDER — MORPHINE SULFATE 4 MG/ML IJ SOLN
4.0000 mg | Freq: Once | INTRAMUSCULAR | Status: AC
  Administered 2011-05-04: 4 mg via INTRAVENOUS
  Filled 2011-05-04: qty 1

## 2011-05-04 MED ORDER — GI COCKTAIL ~~LOC~~
30.0000 mL | Freq: Once | ORAL | Status: AC
  Administered 2011-05-04: 30 mL via ORAL
  Filled 2011-05-04: qty 30

## 2011-05-04 NOTE — ED Notes (Signed)
Pt states has had chest pains since 1030 this morning.  Pt was sitting on couch when pain started.

## 2011-05-04 NOTE — ED Notes (Signed)
Pt asking pt advocate for a soda. RN instructed pt he cannot have anything to eat or drink. Pt states has had a bag of chips and soda. Pt was educated to risk of aspiration  And need to maintain NPO status.  Pt is very upset that he cannot have anything to eat.  Pt also reminded he was treated for epi-gastric pain.

## 2011-05-04 NOTE — ED Provider Notes (Signed)
History     CSN: [id number]  Arrival date & time 05/04/11  1300   First MD Initiated Contact with Patient 05/04/11 1321      Chief Complaint  Patient presents with  . Chest Pain  HPI Patient presents to the emergency room with complaints of chest pain. Patient states it started about 10:30 this morning. He sudden onset of chest pain and a pressure in the center of his chest. The discomfort does not radiate. Patient states that he sitting on the couch when the pain started. It is described as moderate to severe. Patient does have a cough that has been increased over his usual cough. Has not been on any prolonged trips or travel. He has not had any recent surgeries. He does not have history of heart or lung disease. There is no family history of heart or lung disease.  Past Medical History  Diagnosis Date  . Arthritis     right ankle  . Anxiety   . Bipolar disorder     Psychiatrist  . ADHD (attention deficit hyperactivity disorder)   . Depression   . Bipolar disorder     Past Surgical History  Procedure Date  . Ankle surgery '09,'10,'11    right  . Hernia repair     umbilical  . Rotator cuff repair   . Vasectomy   . Tonsillectomy     Family History  Problem Relation Age of Onset  . Cancer Mother     malignant melanoma  . Bipolar disorder Mother   . Cancer Father     lung, abestosis  . Stroke Father   . Colon cancer Neg Hx   . Anesthesia problems Neg Hx   . Hypotension Neg Hx   . Malignant hyperthermia Neg Hx   . Pseudochol deficiency Neg Hx     History  Substance Use Topics  . Smoking status: Current Everyday Smoker -- 2.0 packs/day for 20 years    Types: Cigarettes  . Smokeless tobacco: Not on file  . Alcohol Use: Yes     2-4 beers a month sometime no beers, denies h/o being heavy drinker/LAST DRINK WAS 6 MONTHS      Review of Systems  All other systems reviewed and are negative.    Allergies  Ultram; Adhesive; and Latex  Home Medications    Current Outpatient Rx  Name Route Sig Dispense Refill  . DIPHENHYDRAMINE HCL 25 MG PO CAPS Oral Take 50 mg by mouth at bedtime as needed. For sleep    . DIVALPROEX SODIUM ER 500 MG PO TB24 Oral Take 1,500 mg by mouth at bedtime.      Marland Kitchen MELATONIN 3 MG PO TABS Oral Take 9 mg by mouth at bedtime.      . OMEPRAZOLE 20 MG PO CPDR Oral Take 1 capsule (20 mg total) by mouth 2 (two) times daily. For reflux 60 capsule 11  . PAROXETINE HCL 40 MG PO TABS Oral Take 40 mg by mouth at bedtime.      Marland Kitchen ZOLPIDEM TARTRATE 10 MG PO TABS Oral Take 10 mg by mouth at bedtime. For sleep      BP 120/72  Pulse 76  Temp(Src) 98.3 F (36.8 C) (Oral)  Resp 20  SpO2 100%  Physical Exam  Nursing note and vitals reviewed. Constitutional: He appears well-developed and well-nourished.  HENT:  Head: Normocephalic and atraumatic.  Right Ear: External ear normal.  Left Ear: External ear normal.  Eyes: Conjunctivae are normal. Right eye exhibits  no discharge. Left eye exhibits no discharge. No scleral icterus.  Neck: Neck supple. No tracheal deviation present.  Cardiovascular: Normal rate, regular rhythm and intact distal pulses.   Pulmonary/Chest: Effort normal and breath sounds normal. No stridor. No respiratory distress. He has no wheezes. He has no rales.       Slightly decreased breath sounds in the right side compared to the left  Abdominal: Soft. Bowel sounds are normal. He exhibits no distension. There is no tenderness. There is no rebound and no guarding.  Musculoskeletal: He exhibits no edema and no tenderness.  Neurological: He is alert. He has normal strength. No sensory deficit. Cranial nerve deficit:  no gross defecits noted. He exhibits normal muscle tone. He displays no seizure activity. Coordination normal.  Skin: Skin is warm and dry. No rash noted. He is not diaphoretic.  Psychiatric:       Slightly anxious    ED Course  Procedures (including critical care time)   Rate: 66  Rhythm: normal  sinus rhythm  QRS Axis: normal  Intervals: normal  ST/T Wave abnormalities: normal  Conduction Disutrbances:none  Narrative Interpretation:   Old EKG Reviewed: none available  Labs Reviewed  BASIC METABOLIC PANEL - Abnormal; Notable for the following:    Potassium 3.4 (*)    All other components within normal limits  CBC  D-DIMER, QUANTITATIVE  TROPONIN I  POCT I-STAT TROPONIN I  POCT I-STAT TROPONIN I  POCT I-STAT TROPONIN I  I-STAT TROPONIN I  TROPONIN I   Dg Chest Portable 1 View  05/04/2011  *RADIOLOGY REPORT*  Clinical Data: Chest pain  PORTABLE CHEST - 1 VIEW  Comparison: Chest x-ray of 10/21/2010  Findings: The lungs remain clear and slightly hyperaerated. Mediastinal contours are stable.  The heart is within normal limits in size.  No bony abnormality is seen.  IMPRESSION: Stable chest x-ray.  No active lung disease.  Original Report Authenticated By: Juline Patch, M.D.     1. Chest pain       MDM  Patient presents with complaints of chest pain. I doubt pulmonary embolism. His EKG appears normal and he has minimal risk factors with the exception of his smoking. Patient does have history of anxiety but denies any specific panic attacks are similar symptoms to this. He has noted some improvement with his treatment but is still having discomfort. His troponin test is still pending.   5:30 PM patient has been monitored for several hours in the emergency department. His EKG is normal and serial troponins are normal. He has been having some cough. I suspect this pain may be related to that and doubt coronary artery disease. Doubt pulmonary embolism or aortic dissection.  At this point I doubt an acute emergency medical condition and feel the patient to be safely followed up as an outpatient.       Celene Kras, MD 05/04/11 [phone]

## 2011-05-22 ENCOUNTER — Encounter: Payer: Self-pay | Admitting: Internal Medicine

## 2011-05-23 ENCOUNTER — Ambulatory Visit (INDEPENDENT_AMBULATORY_CARE_PROVIDER_SITE_OTHER): Payer: Medicare Other | Admitting: Gastroenterology

## 2011-05-23 ENCOUNTER — Encounter: Payer: Self-pay | Admitting: Gastroenterology

## 2011-05-23 ENCOUNTER — Telehealth: Payer: Self-pay | Admitting: Gastroenterology

## 2011-05-23 VITALS — BP 131/79 | HR 72 | Temp 98.6°F | Ht 72.0 in | Wt 187.6 lb

## 2011-05-23 DIAGNOSIS — K279 Peptic ulcer, site unspecified, unspecified as acute or chronic, without hemorrhage or perforation: Secondary | ICD-10-CM

## 2011-05-23 NOTE — Telephone Encounter (Signed)
Called pt and informed him of Pre Op on 02/01 @ 12:45

## 2011-05-23 NOTE — Progress Notes (Signed)
Faxed to PCP

## 2011-05-23 NOTE — Progress Notes (Signed)
Primary Care Physician:  Forest Gleason, MD, MD  Primary Gastroenterologist:  Roetta Sessions, MD   Chief Complaint  Patient presents with  . Follow-up    pud    HPI:  Darin Thomas is a 36 y.o. male here for a three-month followup with history of peptic ulcer disease.  Last seen in October 2012. EGD revealed gastric ulcers and erosions. Biopsies were benign, no H. Pylori. Patient states that he was doing well up until a couple of weeks ago. Went to the emergency department on January 4 with chest pain. Heart checked out fine. CBC was normal. Last week developed twisting epigastric pain and went to ED in [place]. States he was given hydrocodone which seemed to help. He tells me that his pancreas looked okay on blood work. He describes twisting abdominal pain radiating into his back. He had vomiting. Denies melena or rectal bleeding. Denies any heartburn on Prilosec. No dysphagia. He rarely takes Aleve for headache. No other NSAIDs or aspirin products.   Current Outpatient Prescriptions  Medication Sig Dispense Refill  . diphenhydrAMINE (BENADRYL) 25 mg capsule Take 50 mg by mouth at bedtime as needed. For sleep      . divalproex (DEPAKOTE) 500 MG 24 hr tablet Take 1,000 mg by mouth at bedtime.       . Melatonin 5 MG TABS Take 10 mg by mouth at bedtime.        Marland Kitchen omeprazole (PRILOSEC) 20 MG capsule Take 1 capsule (20 mg total) by mouth 2 (two) times daily. For reflux  60 capsule  11  . PARoxetine (PAXIL) 40 MG tablet Take 40 mg by mouth at bedtime.        Marland Kitchen zolpidem (AMBIEN) 10 MG tablet Take 10 mg by mouth at bedtime. For sleep        Allergies as of 05/23/2011 - Review Complete 05/23/2011  Allergen Reaction Noted  . Ultram (tramadol hcl) Other (See Comments) 12/22/2010  . Adhesive (tape) Rash 12/22/2010  . Latex Rash 12/22/2010    Past Medical History  Diagnosis Date  . Arthritis     right ankle  . Anxiety   . Bipolar disorder     Psychiatrist  . ADHD (attention deficit  hyperactivity disorder)   . Depression   . Bipolar disorder   . PUD (peptic ulcer disease) 01/2011    Past Surgical History  Procedure Date  . Ankle surgery '09,'10,'11    right  . Hernia repair     umbilical  . Rotator cuff repair   . Vasectomy   . Tonsillectomy   . Esophagogastroduodenoscopy 02/08/11    peptic ulcer dx with gastric erosions/small hiatal hernia polypoid gastric lesion. Bx showed chronic gastritis, no h.pylori    Family History  Problem Relation Age of Onset  . Cancer Mother     malignant melanoma  . Bipolar disorder Mother   . Cancer Father     lung, abestosis  . Stroke Father   . Colon cancer Neg Hx   . Anesthesia problems Neg Hx   . Hypotension Neg Hx   . Malignant hyperthermia Neg Hx   . Pseudochol deficiency Neg Hx     History   Social History  . Marital Status: Single    Spouse Name: N/A    Number of Children: 3  . Years of Education: N/A   Occupational History  . disability   .     Social History Main Topics  . Smoking status: Current Everyday Smoker -- 2.0  packs/day for 20 years    Types: Cigarettes  . Smokeless tobacco: Not on file  . Alcohol Use: Yes     2-4 beers a month sometime no beers, denies h/o being heavy drinker/LAST DRINK WAS 6 MONTHS  . Drug Use: Yes    Special: Marijuana     marijuana smoked joint a few weeks ago  . Sexually Active: Not on file   Other Topics Concern  . Not on file   Social History Narrative   2 sons in [place]. Little to no contact.Daughter local, contact with.      ROS:  General: Negative for anorexia, weight loss, fever, chills, fatigue, weakness. Eyes: Negative for vision changes.  ENT: Negative for hoarseness, difficulty swallowing , nasal congestion. CV: See HPI. Respiratory: Negative for dyspnea at rest, dyspnea on exertion, cough, sputum, wheezing.  GI: See history of present illness. GU:  Negative for dysuria, hematuria, urinary incontinence, urinary frequency, nocturnal  urination.  MS: Negative for joint pain, low back pain.  Derm: Negative for rash or itching.  Neuro: Negative for weakness, abnormal sensation, seizure, frequent headaches, memory loss, confusion.  Psych: Negative for anxiety, depression, suicidal ideation, hallucinations.  Endo: Negative for unusual weight change.  Heme: Negative for bruising or bleeding. Allergy: Negative for rash or hives.    Physical Examination:  BP 131/79  Pulse 72  Temp(Src) 98.6 F (37 C) (Temporal)  Ht 6' (1.829 m)  Wt 187 lb 9.6 oz (85.095 kg)  BMI 25.44 kg/m2   General: Well-nourished, well-developed in no acute distress. Accompanied by mother.  Head: Normocephalic, atraumatic.   Eyes: Conjunctiva pink, no icterus. Mouth: Oropharyngeal mucosa moist and pink , no lesions erythema or exudate. Neck: Supple without thyromegaly, masses, or lymphadenopathy.  Lungs: Clear to auscultation bilaterally.  Heart: Regular rate and rhythm, no murmurs rubs or gallops.  Abdomen: Bowel sounds are normal. Mild epigastric tenderness (patient practically flinches before I touch him), nondistended, no hepatosplenomegaly or masses, no abdominal bruits or    hernia , no rebound or guarding.   Rectal: Not done. Extremities: No lower extremity edema. No clubbing or deformities.  Neuro: Alert and oriented x 4 , grossly normal neurologically.  Skin: Warm and dry, no rash or jaundice.   Psych: Alert and cooperative, normal mood and affect.  Labs: Lab Results  Component Value Date   WBC 8.3 05/04/2011   HGB 14.8 05/04/2011   HCT 42.3 05/04/2011   MCV 90.0 05/04/2011   PLT 268 05/04/2011   Lab Results  Component Value Date   CREATININE 0.88 05/04/2011   BUN 13 05/04/2011   NA 140 05/04/2011   K 3.4* 05/04/2011   CL 106 05/04/2011   CO2 27 05/04/2011    Imaging Studies: Dg Chest Portable 1 View  05/04/2011  *RADIOLOGY REPORT*  Clinical Data: Chest pain  PORTABLE CHEST - 1 VIEW  Comparison: Chest x-ray of 10/21/2010  Findings: The lungs  remain clear and slightly hyperaerated. Mediastinal contours are stable.  The heart is within normal limits in size.  No bony abnormality is seen.  IMPRESSION: Stable chest x-ray.  No active lung disease.  Original Report Authenticated By: Juline Patch, M.D.

## 2011-05-23 NOTE — Assessment & Plan Note (Signed)
Repeat EGD to very healing of PUD. Discussed option of trying conscious sedation augmented with phenergan but patient and patient's mother requesting deep sedation given his chronic narcotic use and low-threshold for pain.  I have discussed the risks, alternatives, benefits with regards to but not limited to the risk of reaction to medication, bleeding, infection, perforation and the patient is agreeable to proceed. Written consent to be obtained.  Patient requested narcotics for abdominal pain. Declined due to ?drug seeking behavior and questionable exam today. If pain worsens, go to ED. Continue Prilosec and avoid NSAIDS.

## 2011-05-23 NOTE — Patient Instructions (Signed)
We have scheduled you for an upper endoscopy to make sure your ulcers have healed. Please see separate instructions.

## 2011-05-28 ENCOUNTER — Encounter (HOSPITAL_COMMUNITY): Payer: Self-pay | Admitting: Pharmacy Technician

## 2011-05-29 ENCOUNTER — Emergency Department (HOSPITAL_COMMUNITY): Payer: Medicare Other

## 2011-05-29 ENCOUNTER — Emergency Department (HOSPITAL_COMMUNITY)
Admission: EM | Admit: 2011-05-29 | Discharge: 2011-05-30 | Disposition: A | Discharge status: Home / Self Care | Payer: Medicare Other | Attending: Emergency Medicine | Admitting: Emergency Medicine

## 2011-05-29 ENCOUNTER — Encounter (HOSPITAL_COMMUNITY): Payer: Self-pay | Admitting: *Deleted

## 2011-05-29 DIAGNOSIS — W010XXA Fall on same level from slipping, tripping and stumbling without subsequent striking against object, initial encounter: Secondary | ICD-10-CM | POA: Insufficient documentation

## 2011-05-29 DIAGNOSIS — F319 Bipolar disorder, unspecified: Secondary | ICD-10-CM | POA: Insufficient documentation

## 2011-05-29 DIAGNOSIS — K429 Umbilical hernia without obstruction or gangrene: Secondary | ICD-10-CM | POA: Insufficient documentation

## 2011-05-29 DIAGNOSIS — F17200 Nicotine dependence, unspecified, uncomplicated: Secondary | ICD-10-CM | POA: Insufficient documentation

## 2011-05-29 DIAGNOSIS — M546 Pain in thoracic spine: Secondary | ICD-10-CM | POA: Insufficient documentation

## 2011-05-29 DIAGNOSIS — R109 Unspecified abdominal pain: Secondary | ICD-10-CM | POA: Insufficient documentation

## 2011-05-29 DIAGNOSIS — Z9852 Vasectomy status: Secondary | ICD-10-CM | POA: Insufficient documentation

## 2011-05-29 DIAGNOSIS — F909 Attention-deficit hyperactivity disorder, unspecified type: Secondary | ICD-10-CM | POA: Insufficient documentation

## 2011-05-29 DIAGNOSIS — Z800 Family history of malignant neoplasm of digestive organs: Secondary | ICD-10-CM | POA: Insufficient documentation

## 2011-05-29 DIAGNOSIS — IMO0002 Reserved for concepts with insufficient information to code with codable children: Secondary | ICD-10-CM | POA: Insufficient documentation

## 2011-05-29 DIAGNOSIS — K279 Peptic ulcer, site unspecified, unspecified as acute or chronic, without hemorrhage or perforation: Secondary | ICD-10-CM | POA: Insufficient documentation

## 2011-05-29 DIAGNOSIS — M19079 Primary osteoarthritis, unspecified ankle and foot: Secondary | ICD-10-CM | POA: Insufficient documentation

## 2011-05-29 LAB — DIFFERENTIAL
Basophils Absolute: 0.1 10*3/uL (ref 0.0–0.1)
Eosinophils Relative: 3 % (ref 0–5)
Lymphocytes Relative: 40 % (ref 12–46)
Neutro Abs: 4.4 10*3/uL (ref 1.7–7.7)

## 2011-05-29 LAB — BASIC METABOLIC PANEL
CO2: 28 mEq/L (ref 19–32)
Calcium: 9.2 mg/dL (ref 8.4–10.5)
Chloride: 99 mEq/L (ref 96–112)
Sodium: 138 mEq/L (ref 135–145)

## 2011-05-29 LAB — CBC
Platelets: 278 10*3/uL (ref 150–400)
RDW: 13 % (ref 11.5–15.5)
WBC: 9 10*3/uL (ref 4.0–10.5)

## 2011-05-29 MED ORDER — HYDROMORPHONE HCL PF 1 MG/ML IJ SOLN
1.0000 mg | Freq: Once | INTRAMUSCULAR | Status: AC
  Administered 2011-05-29: 1 mg via INTRAVENOUS
  Filled 2011-05-29: qty 1

## 2011-05-29 MED ORDER — ONDANSETRON HCL 4 MG/2ML IJ SOLN
4.0000 mg | Freq: Once | INTRAMUSCULAR | Status: AC
  Administered 2011-05-29: 4 mg via INTRAVENOUS
  Filled 2011-05-29: qty 2

## 2011-05-29 MED ORDER — HYDROMORPHONE HCL PF 2 MG/ML IJ SOLN
2.0000 mg | Freq: Once | INTRAMUSCULAR | Status: AC
  Administered 2011-05-30: 2 mg via INTRAVENOUS
  Filled 2011-05-29: qty 1

## 2011-05-29 NOTE — ED Notes (Signed)
Called to patients room- requesting pain med for pain umbilical area and posterior neck.  Note: was evaluated 05/26/11 at [facility] - states he was prescribed a muscle relaxer (Flexeril and also Hydrocodone) No flexeril today and last hydrocodone tablet at 03:00am.  Does not have any more pain meds.

## 2011-05-29 NOTE — ED Notes (Addendum)
Also complains that his neck is stiff and cannot turn it - experienced a fall this past weekend and thinks the neck discomfort is due to the fall.  Has healing abrasions from another fall.

## 2011-05-29 NOTE — ED Notes (Signed)
Pt reports he was lifting a washer yesterday and now has periumbilical pain, pt reports hx of hernia surgery

## 2011-05-29 NOTE — ED Notes (Signed)
Medicated and saline flush to saline lock.
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
## 2011-05-29 NOTE — ED Notes (Signed)
Patient asking when his pain medication "will kick in"  Advised he should have noticed a response prior to now.  States he has a high tolerance.  Up to bathroom -  Patients mother also in earlier to check on him.

## 2011-05-30 ENCOUNTER — Emergency Department (HOSPITAL_COMMUNITY): Payer: Medicare Other

## 2011-05-30 MED ORDER — OXYCODONE-ACETAMINOPHEN 5-325 MG PO TABS: ORAL_TABLET | ORAL | Status: DC

## 2011-05-30 MED ORDER — IOHEXOL 300 MG/ML  SOLN
100.0000 mL | Freq: Once | INTRAMUSCULAR | Status: AC | PRN
  Administered 2011-05-30: 100 mL via INTRAVENOUS

## 2011-05-30 MED ORDER — OXYCODONE-ACETAMINOPHEN 5-325 MG PO TABS
2.0000 | ORAL_TABLET | Freq: Once | ORAL | Status: AC
  Administered 2011-05-30: 2 via ORAL

## 2011-05-30 MED ORDER — OXYCODONE-ACETAMINOPHEN 5-325 MG PO TABS
ORAL_TABLET | ORAL | Status: AC
  Filled 2011-05-30: qty 2

## 2011-05-30 MED ORDER — IOHEXOL 300 MG/ML  SOLN
40.0000 mL | Freq: Once | INTRAMUSCULAR | Status: AC | PRN
  Administered 2011-05-30: 40 mL via ORAL
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
## 2011-05-30 NOTE — ED Notes (Signed)
Patient drinking contrast, watching TV

## 2011-05-30 NOTE — ED Provider Notes (Addendum)
Care assumed from Dr. Preston Fleeting.  I agree with his assessment and plan as above.  The patient had been in the department for over four hours prior to signout.  I was given the instructions that I attempt manual reduction after his next dose of pain medication.  After repeat doses of dilaudid, he tells me he is feeling no better.  He has been ambulatory in the exam room and emergency department, appears in no distress, and has no limitations to his mobility.  At one point, he excused himself from the department to go outside to "have a smoke".  He also was requesting water and something to eat which I did not allow him to have until a surgical cause was ruled out.  I examined his abdomen and found a marble-shaped knot in the abdomen under the scar at the umbilicus.  It was tender and I was unable to effectively reduce it.  I spoke with Dr. Lovell Sheehan who wants a CT scan done to determine whether this is bowel, or omentum, or scar tissue.  I explained to the patient the reason for the ct scan, and that this will direct the final disposition which could range from discharge with no further intervention to elective or even emergency surgery.  The care will be handed over to Dr. Clarene Duke at shift change pending the ct results.    Geoffery Lyons, MD 05/30/11 1610  Geoffery Lyons, MD 05/30/11 [id number]  Geoffery Lyons, MD 05/30/11 [phone]

## 2011-05-31 NOTE — H&P (Signed)
  NTS SOAP Note  Vital Signs:  Vitals as of: 05/31/2011: Systolic 123: Diastolic 79: Heart Rate 88: Temp 98.50F: Height 20ft 0in: Weight 182Lbs 0 Ounces: OFC 0in: Respiratory Rate 0: O2 Saturation 0: Pain Level 10: BMI 25  BMI : 24.68 kg/m2  Subjective: This 36 Years 5 Months old Male presents for of a recurrent umbilical hernia.  [place] umbilical her[place]iorrh[place], recently noted a painful lump just superior to the umbilicus.  Seen in ER, found to have incarcerated fat in the umbilicus.  No nausea, vomiting.  Review of Symptoms:  Constitutional:fatigue Head:unremarkable Eyes:negative Nose/Mouth/Throat:unremarkable Cardiovascular:unremarkable Respiratory:cough Gastrointestinal:unremarkable Genitourinary:frequency Musculoskeletal:unremarkable Skin:unremarkable Hematolgic/Lymphatic:unremarkable Allergic/Immunologic:unremarkable   Past Medical History:Reviewed   Past Medical History  Surgical History: umbilical herniorrhaphy, rotator cuff, ankle surgeyr, vasectomy, T and A Medical Problems: GERD Psychiatric History:  Anxiety, Depression Allergies: latex, adhesives, tramadol, voltaren gel Medications: paxil, ambien, depakote, adderol, benedryl, prilosec, melatonin   Social History:Reviewed   Social History  Preferred Language: English ([place]) Race:  White Ethnicity: Not Hispanic / Latino Age: 36 Years 5 Months Marital Status:  S Alcohol:  No Recreational drug(s):  No   Smoking Status: Current every day smoker reviewed on 05/31/2011 Started Date: 04/30/1993 Packs per day: 2.00   Family History:Reviewed   Family History              Father:  Cancer             Mother:  Cancer    Objective Information: General:Well appearing, well nourished in no distress. Head:Atraumatic; no masses; no abnormalities Neck:Supple without lymphadenopathy.  Heart:RRR, no murmur or gallop.   Normal S1, S2.  No S3, S4.  Lungs:CTA bilaterally, no wheezes, rhonchi, rales.  Breathing unlabored. Abdomen:Soft, ND, no HSM, no masses.  Tender mass just below surgical scar at umbilicus.  Did not attempt to reduce.  Assessment:Incisional hernia  Diagnosis &amp; Procedure: DiagnosisCode: 552.21, ProcedureCode: 04540,    Plan:Scheduled for incisional herniorrhaphy with mesh on 06/04/11.   Patient Education:Alternative treatments to surgery were discussed with patient (and family).Risks and benefits  of procedure were fully explained to the patient (and family) who gave informed consent. Patient/family questions were addressed.  Follow-up:Pending Surgery

## 2011-06-01 ENCOUNTER — Encounter (HOSPITAL_COMMUNITY)
Admission: RE | Admit: 2011-06-01 | Discharge: 2011-06-01 | Disposition: A | Discharge status: Home / Self Care | Payer: Medicare Other | Source: Ambulatory Visit | Attending: General Surgery | Admitting: General Surgery

## 2011-06-01 ENCOUNTER — Encounter (HOSPITAL_COMMUNITY): Payer: Self-pay

## 2011-06-01 ENCOUNTER — Encounter (HOSPITAL_COMMUNITY): Payer: Medicare Other

## 2011-06-01 LAB — DIFFERENTIAL
Lymphocytes Relative: 49 % — ABNORMAL HIGH (ref 12–46)
Monocytes Absolute: 0.5 10*3/uL (ref 0.1–1.0)
Monocytes Relative: 8 % (ref 3–12)
Neutro Abs: 1.9 10*3/uL (ref 1.7–7.7)
Neutrophils Relative %: 35 % — ABNORMAL LOW (ref 43–77)

## 2011-06-01 LAB — BASIC METABOLIC PANEL
Chloride: 102 mEq/L (ref 96–112)
GFR calc Af Amer: >90 mL/min (ref >90)
GFR calc non Af Amer: >90 mL/min (ref >90)
Potassium: 4 mEq/L (ref 3.5–5.1)
Sodium: 139 mEq/L (ref 135–145)

## 2011-06-01 LAB — CBC
HCT: 40.4 % (ref 39.0–52.0)
Hemoglobin: 14.1 g/dL (ref 13.0–17.0)
MCH: 31.1 pg (ref 26.0–34.0)
MCHC: 34.9 g/dL (ref 30.0–36.0)
MCV: 89.2 fL (ref 78.0–100.0)
Platelets: 274 K/uL (ref 150–400)
RBC: 4.53 MIL/uL (ref 4.22–5.81)
RDW: 13.1 % (ref 11.5–15.5)
WBC: 5.5 K/uL (ref 4.0–10.5)

## 2011-06-01 LAB — SURGICAL PCR SCREEN
MRSA, PCR: NEGATIVE
Staphylococcus aureus: NEGATIVE

## 2011-06-01 NOTE — Patient Instructions (Signed)
20 Darin Thomas  06/01/2011   Your procedure is scheduled on:  06/04/2011  Report to [facility] at 0900 AM.  Call this number if you have problems the morning of surgery: [phone]   Remember:   Do not eat food:After Midnight.  May have clear liquids:until Midnight .  Clear liquids include soda, tea, black coffee, apple or grape juice, broth.  Take these medicines the morning of surgery with A SIP OF WATER: Adderall, Depakote, Paxil, Prilosec, oxycodone   Do not wear jewelry, make-up or nail polish.  Do not wear lotions, powders, or perfumes. You may wear deodorant.  Do not shave 48 hours prior to surgery.  Do not bring valuables to the hospital.  Contacts, dentures or bridgework may not be worn into surgery.  Leave suitcase in the car. After surgery it may be brought to your room.  For patients admitted to the hospital, checkout time is 11:00 AM the day of discharge.   Patients discharged the day of surgery will not be allowed to drive home.  Name and phone number of your driver: Family  Special Instructions: CHG Shower Use Special Wash: 1/2 bottle night before surgery and 1/2 bottle morning of surgery.   Please read over the following fact sheets that you were given: Pain Booklet, MRSA Information, Surgical Site Infection Prevention, Anesthesia Post-op Instructions and Care and Recovery After Surgery   PATIENT INSTRUCTIONS POST-ANESTHESIA  IMMEDIATELY FOLLOWING SURGERY:  Do not drive or operate machinery for the first twenty four hours after surgery.  Do not make any important decisions for twenty four hours after surgery or while taking narcotic pain medications or sedatives.  If you develop intractable nausea and vomiting or a severe headache please notify your doctor immediately.  FOLLOW-UP:  Please make an appointment with your surgeon as instructed. You do not need to follow up with anesthesia unless specifically instructed to do so.  WOUND CARE INSTRUCTIONS (if applicable):   Keep a dry clean dressing on the anesthesia/puncture wound site if there is drainage.  Once the wound has quit draining you may leave it open to air.  Generally you should leave the bandage intact for twenty four hours unless there is drainage.  If the epidural site drains for more than 36-48 hours please call the anesthesia department.  QUESTIONS?:  Please feel free to call your physician or the hospital operator if you have any questions, and they will be happy to assist you.     [facility] Anesthesia Department [address] [place] [phone]    Hernia Repair Care After These instructions give you information on caring for yourself after your procedure. Your doctor may also give you more specific instructions. Call your doctor if you have any problems or questions after your procedure. HOME CARE   You may have changes in your poops (bowel movements).   You may have loose or watery poop (diarrhea).   You may be not able to poop.   Your bowels will slowly get back to normal.   Do not eat any food that makes you sick to your stomach (nauseous). Eat small meals 4 to 6 times a day instead of 3 large ones.   Do not drink pop. It will give you gas.   Do not drink alcohol.   Do not lift anything heavier than 10 pounds. This is about the weight of a gallon of milk.   Do not do anything that makes you very tired for at least  6 weeks.   Do not get your wound wet for 2 days.   You may take a sponge bath during this time.   After 2 days you may take a shower. Gently pat your surgical cut (incision) dry with a towel. Do not rub it.   For men: You may have been given an athletic supporter (scrotal support) before you left the hospital. It holds your scrotum and testicles closer to your body so there is no strain on your wound. Wear the supporter until your doctor tells you that you do not need it anymore.  GET HELP RIGHT AWAY IF:  You have watery poop, or cannot poop  for more than 3 days.   You feel sick to your stomach or throw up (vomit) more than 2 or 3 times.   You have temperature by mouth above 102 F (38.9 C).   You see redness or puffiness (swelling) around your wound.   You see yellowish white fluid (pus) coming from your wound.   You see a bulge or bump in your lower belly (abdomen) or near your groin.   You develop a rash, trouble breathing, or any other symptoms from medicines taken.  MAKE SURE YOU:  Understand these instructions.   Will watch your condition.   Will get help right away if your are not doing well or get worse.  Document Released: 03/29/2008 Document Revised: 12/27/2010 Document Reviewed: 03/29/2008 [facility] Patient Information 2012 [place], [place].

## 2011-06-04 ENCOUNTER — Encounter (HOSPITAL_COMMUNITY): Payer: Self-pay | Admitting: Anesthesiology

## 2011-06-04 ENCOUNTER — Encounter (HOSPITAL_COMMUNITY)
Admission: RE | Disposition: A | Discharge status: Home / Self Care | Payer: Self-pay | Source: Ambulatory Visit | Attending: General Surgery

## 2011-06-04 ENCOUNTER — Ambulatory Visit (HOSPITAL_COMMUNITY)
Admission: RE | Admit: 2011-06-04 | Discharge: 2011-06-04 | Disposition: A | Discharge status: Home / Self Care | Payer: Medicare Other | Source: Ambulatory Visit | Attending: General Surgery | Admitting: General Surgery

## 2011-06-04 ENCOUNTER — Ambulatory Visit (HOSPITAL_COMMUNITY): Payer: Medicare Other | Admitting: Anesthesiology

## 2011-06-04 ENCOUNTER — Encounter (HOSPITAL_COMMUNITY): Payer: Self-pay | Admitting: *Deleted

## 2011-06-04 DIAGNOSIS — Z01812 Encounter for preprocedural laboratory examination: Secondary | ICD-10-CM | POA: Insufficient documentation

## 2011-06-04 DIAGNOSIS — K430 Incisional hernia with obstruction, without gangrene: Secondary | ICD-10-CM | POA: Insufficient documentation

## 2011-06-04 HISTORY — PX: INCISIONAL HERNIA REPAIR: SHX193

## 2011-06-04 SURGERY — REPAIR, HERNIA, INCISIONAL
Anesthesia: General | Site: Abdomen | Wound class: Clean

## 2011-06-04 MED ORDER — FENTANYL CITRATE 0.05 MG/ML IJ SOLN
INTRAMUSCULAR | Status: DC | PRN
  Administered 2011-06-04 (×2): 50 ug via INTRAVENOUS

## 2011-06-04 MED ORDER — KETOROLAC TROMETHAMINE 30 MG/ML IJ SOLN
30.0000 mg | Freq: Once | INTRAMUSCULAR | Status: AC
  Administered 2011-06-04: 30 mg via INTRAVENOUS

## 2011-06-04 MED ORDER — ONDANSETRON HCL 4 MG/2ML IJ SOLN: 4.0000 mg | Freq: Once | INTRAMUSCULAR | Status: DC | PRN

## 2011-06-04 MED ORDER — FENTANYL CITRATE 0.05 MG/ML IJ SOLN
25.0000 ug | INTRAMUSCULAR | Status: DC | PRN
  Administered 2011-06-04 (×2): 50 ug via INTRAVENOUS

## 2011-06-04 MED ORDER — ONDANSETRON HCL 4 MG/2ML IJ SOLN
4.0000 mg | Freq: Once | INTRAMUSCULAR | Status: AC
  Administered 2011-06-04: 4 mg via INTRAVENOUS

## 2011-06-04 MED ORDER — KETOROLAC TROMETHAMINE 30 MG/ML IJ SOLN
INTRAMUSCULAR | Status: AC
  Filled 2011-06-04: qty 1

## 2011-06-04 MED ORDER — PROPOFOL 10 MG/ML IV BOLUS
INTRAVENOUS | Status: DC | PRN
  Administered 2011-06-04: 180 mg via INTRAVENOUS

## 2011-06-04 MED ORDER — ENOXAPARIN SODIUM 40 MG/0.4ML ~~LOC~~ SOLN
40.0000 mg | Freq: Once | SUBCUTANEOUS | Status: AC
  Administered 2011-06-04: 40 mg via SUBCUTANEOUS

## 2011-06-04 MED ORDER — LACTATED RINGERS IV SOLN
INTRAVENOUS | Status: DC
  Administered 2011-06-04: 10:00:00 via INTRAVENOUS

## 2011-06-04 MED ORDER — MIDAZOLAM HCL 2 MG/2ML IJ SOLN
1.0000 mg | INTRAMUSCULAR | Status: DC | PRN
  Administered 2011-06-04 (×2): 2 mg via INTRAVENOUS

## 2011-06-04 MED ORDER — SODIUM CHLORIDE 0.9 % IR SOLN
Status: DC | PRN
  Administered 2011-06-04: 1000 mL

## 2011-06-04 MED ORDER — POVIDONE-IODINE 10 % OINT PACKET: TOPICAL_OINTMENT | CUTANEOUS | Status: DC | PRN

## 2011-06-04 MED ORDER — CEFAZOLIN SODIUM 1-5 GM-% IV SOLN: 1.0000 g | INTRAVENOUS | Status: DC

## 2011-06-04 MED ORDER — POVIDONE-IODINE 10 % EX OINT
TOPICAL_OINTMENT | CUTANEOUS | Status: AC
  Filled 2011-06-04: qty 2

## 2011-06-04 MED ORDER — ONDANSETRON HCL 4 MG/2ML IJ SOLN
INTRAMUSCULAR | Status: AC
  Administered 2011-06-04: 4 mg via INTRAVENOUS
  Filled 2011-06-04: qty 2

## 2011-06-04 MED ORDER — ENOXAPARIN SODIUM 40 MG/0.4ML ~~LOC~~ SOLN
SUBCUTANEOUS | Status: AC
  Administered 2011-06-04: 40 mg via SUBCUTANEOUS
  Filled 2011-06-04: qty 0.4

## 2011-06-04 MED ORDER — BUPIVACAINE HCL (PF) 0.5 % IJ SOLN
INTRAMUSCULAR | Status: DC | PRN
  Administered 2011-06-04: 15 mL

## 2011-06-04 MED ORDER — FENTANYL CITRATE 0.05 MG/ML IJ SOLN
INTRAMUSCULAR | Status: AC
  Administered 2011-06-04: 50 ug via INTRAVENOUS
  Filled 2011-06-04: qty 2

## 2011-06-04 MED ORDER — MIDAZOLAM HCL 2 MG/2ML IJ SOLN
INTRAMUSCULAR | Status: AC
  Filled 2011-06-04: qty 2

## 2011-06-04 MED ORDER — OXYCODONE-ACETAMINOPHEN 5-325 MG PO TABS: ORAL_TABLET | ORAL | Status: AC

## 2011-06-04 MED ORDER — MIDAZOLAM HCL 2 MG/2ML IJ SOLN
INTRAMUSCULAR | Status: AC
  Administered 2011-06-04: 2 mg via INTRAVENOUS
  Filled 2011-06-04: qty 2

## 2011-06-04 MED ORDER — CEFAZOLIN SODIUM 1-5 GM-% IV SOLN
INTRAVENOUS | Status: AC
  Filled 2011-06-04: qty 50

## 2011-06-04 MED ORDER — BUPIVACAINE HCL (PF) 0.5 % IJ SOLN
INTRAMUSCULAR | Status: AC
  Filled 2011-06-04: qty 30

## 2011-06-04 MED ORDER — LIDOCAINE HCL 1 % IJ SOLN
INTRAMUSCULAR | Status: DC | PRN
  Administered 2011-06-04: 30 mg via INTRADERMAL

## 2011-06-04 SURGICAL SUPPLY — 40 items
BAG HAMPER (MISCELLANEOUS) ×2 IMPLANT
CLOTH BEACON ORANGE TIMEOUT ST (SAFETY) ×2 IMPLANT
COVER LIGHT HANDLE STERIS (MISCELLANEOUS) ×4 IMPLANT
DECANTER SPIKE VIAL GLASS SM (MISCELLANEOUS) ×2 IMPLANT
DRSG TEGADERM 2-3/8X2-3/4 SM (GAUZE/BANDAGES/DRESSINGS) ×2 IMPLANT
DURAPREP 26ML APPLICATOR (WOUND CARE) ×2 IMPLANT
ELECT REM PT RETURN 9FT ADLT (ELECTROSURGICAL) ×2
ELECTRODE REM PT RTRN 9FT ADLT (ELECTROSURGICAL) ×1 IMPLANT
FORMALIN 10 PREFIL 480ML (MISCELLANEOUS) IMPLANT
GLOVE BIO SURGEON STRL SZ7.5 (GLOVE) IMPLANT
GLOVE INDICATOR 7.0 STRL GRN (GLOVE) ×2 IMPLANT
GLOVE SKINSENSE NS SZ6.5 (GLOVE) ×1
GLOVE SKINSENSE NS SZ7.5 (GLOVE) ×1
GLOVE SKINSENSE STRL SZ6.5 (GLOVE) ×1 IMPLANT
GLOVE SKINSENSE STRL SZ7.5 (GLOVE) ×1 IMPLANT
GOWN STRL REIN XL XLG (GOWN DISPOSABLE) ×6 IMPLANT
INST SET MAJOR GENERAL (KITS) ×2 IMPLANT
KIT ROOM TURNOVER APOR (KITS) ×2 IMPLANT
MANIFOLD NEPTUNE II (INSTRUMENTS) ×2 IMPLANT
NEEDLE HYPO 25X1 1.5 SAFETY (NEEDLE) ×2 IMPLANT
NS IRRIG 1000ML POUR BTL (IV SOLUTION) ×2 IMPLANT
PACK ABDOMINAL MAJOR (CUSTOM PROCEDURE TRAY) ×2 IMPLANT
PAD ARMBOARD 7.5X6 YLW CONV (MISCELLANEOUS) ×2 IMPLANT
SET BASIN LINEN APH (SET/KITS/TRAYS/PACK) ×2 IMPLANT
SPONGE GAUZE 2X2 8PLY STRL LF (GAUZE/BANDAGES/DRESSINGS) ×2 IMPLANT
SPONGE GAUZE 4X4 12PLY (GAUZE/BANDAGES/DRESSINGS) IMPLANT
STAPLER VISISTAT (STAPLE) IMPLANT
SUT ETHIBOND NAB MO 7 #0 18IN (SUTURE) IMPLANT
SUT NOVA NAB GS-21 1 T12 (SUTURE) IMPLANT
SUT NOVA NAB GS-22 2 2-0 T-19 (SUTURE) IMPLANT
SUT NOVA NAB GS-26 0 60 (SUTURE) IMPLANT
SUT PROLENE 0 CT 1 CR/8 (SUTURE) ×2 IMPLANT
SUT SILK 2 0 (SUTURE)
SUT SILK 2-0 18XBRD TIE 12 (SUTURE) IMPLANT
SUT VIC AB 2-0 CT1 27 (SUTURE) ×1
SUT VIC AB 2-0 CT1 TAPERPNT 27 (SUTURE) ×1 IMPLANT
SUT VIC AB 3-0 SH 27 (SUTURE) ×1
SUT VIC AB 3-0 SH 27X BRD (SUTURE) ×1 IMPLANT
SUT VIC AB 4-0 PS2 27 (SUTURE) IMPLANT
SYR CONTROL 10ML LL (SYRINGE) ×2 IMPLANT

## 2011-06-04 NOTE — Progress Notes (Signed)
Sleeping. Awakens easily to name. Rates pain 4 while awake. Returns to sleep easily.

## 2011-06-04 NOTE — Interval H&P Note (Signed)
History and Physical Interval Note:  06/04/2011 9:55 AM  Darin Thomas  has presented today for surgery, with the diagnosis of Hernia, incisional   The various methods of treatment have been discussed with the patient and family. After consideration of risks, benefits and other options for treatment, the patient has consented to  Procedure(s): HERNIA REPAIR INCISIONAL as a surgical intervention .  The patients' history has been reviewed, patient examined, no change in status, stable for surgery.  I have reviewed the patients' chart and labs.  Questions were answered to the patient's satisfaction.     [name] A

## 2011-06-04 NOTE — Transfer of Care (Signed)
Immediate Anesthesia Transfer of Care Note  Patient: Darin Thomas  Procedure(s) Performed:  HERNIA REPAIR INCISIONAL - with Mesh  Patient Location: PACU  Anesthesia Type: General  Level of Consciousness: awake, alert  and oriented  Airway & Oxygen Therapy: Patient Spontanous Breathing  Post-op Assessment: Report given to PACU RN  Post vital signs: Reviewed and stable  Complications: No apparent anesthesia complications

## 2011-06-04 NOTE — Anesthesia Procedure Notes (Signed)
Procedure Name: LMA Insertion Date/Time: 06/04/2011 10:12 AM Performed by: Glynn Octave Pre-anesthesia Checklist: Patient identified, Patient being monitored, Emergency Drugs available, Timeout performed and Suction available Patient Re-evaluated:Patient Re-evaluated prior to inductionOxygen Delivery Method: Circle System Utilized Preoxygenation: Pre-oxygenation with 100% oxygen Intubation Type: IV induction Ventilation: Mask ventilation without difficulty LMA: LMA inserted LMA Size: 4.0 Number of attempts: 1 Placement Confirmation: positive ETCO2 and breath sounds checked- equal and bilateral

## 2011-06-04 NOTE — Anesthesia Postprocedure Evaluation (Signed)
  Anesthesia Post-op Note  Patient: Darin Thomas  Procedure(s) Performed:  HERNIA REPAIR INCISIONAL - with Mesh  Patient Location: PACU  Anesthesia Type: General  Level of Consciousness: awake, alert  and oriented  Airway and Oxygen Therapy: Patient Spontanous Breathing and Patient connected to face mask oxygen  Post-op Pain: none  Post-op Assessment: Post-op Vital signs reviewed, Patient's Cardiovascular Status Stable, Respiratory Function Stable, Patent Airway and No signs of Nausea or vomiting  Post-op Vital Signs: Reviewed and stable  Complications: No apparent anesthesia complications

## 2011-06-04 NOTE — Anesthesia Preprocedure Evaluation (Signed)
Anesthesia Evaluation  Patient identified by MRN, date of birth, ID band Patient awake    Reviewed: Allergy & Precautions, H&P , NPO status , Patient's Chart, lab work & pertinent test results  History of Anesthesia Complications Negative for: history of anesthetic complications  Airway Mallampati: I      Dental  (+) Edentulous Upper and Edentulous Lower   Pulmonary COPD (hx asbestosis)Current Smoker (chronic cough),    Pulmonary exam normal       Cardiovascular neg cardio ROS Regular Normal    Neuro/Psych PSYCHIATRIC DISORDERS (ADHD) Anxiety Depression Bipolar Disorder    GI/Hepatic PUD,   Endo/Other    Renal/GU      Musculoskeletal   Abdominal   Peds  Hematology   Anesthesia Other Findings   Reproductive/Obstetrics                           Anesthesia Physical Anesthesia Plan  ASA: III  Anesthesia Plan: General   Post-op Pain Management:    Induction: Intravenous  Airway Management Planned: LMA  Additional Equipment:   Intra-op Plan:   Post-operative Plan: Extubation in OR  Informed Consent: I have reviewed the patients History and Physical, chart, labs and discussed the procedure including the risks, benefits and alternatives for the proposed anesthesia with the patient or authorized representative who has indicated his/her understanding and acceptance.     Plan Discussed with:   Anesthesia Plan Comments:         Anesthesia Quick Evaluation

## 2011-06-04 NOTE — Op Note (Signed)
Patient:  Darin Thomas  DOB:  23-Oct-1975  MRN:  [id number]   Preop Diagnosis:  Incisional hernia  Postop Diagnosis:  Same  Procedure:  Incisional herniorrhaphy  Surgeon:  [name], M.D.  Anes:  General  Indications:  Patient is a 36 year old white male who presents with a supraumbilical hernia. He has had a previous umbilical herniorrhaphy [place] the [place]. He now presents the operating room for an incisional herniorrhaphy with possible mesh placement. Risks and benefits of the procedure including bleeding, infection, recurrence of the hernia were fully explained to the patient, gave informed consent.  Procedure note:  Patient is placed the supine position. After general anesthesia was administered, the abdomen was prepped and draped using usual sterile technique with DuraPrep. Surgical site confirmation was performed.  A supraumbilical incision was made to a previous surgical scar. This was taken down to the fascia. The umbilicus was freed away from the ventral wall. The patient had a small 5 mm defect in the abdominal wall which allowed pro peritoneal fat to emanate to this. Omentum was also noted in this. He was incarcerated. The omentum was excised and then reduced. The defect was closed transversely using 0 Ethibond interrupted sutures. The base the umbilicus was secured back to the fascia using a 2-0 Vicryl interrupted suture. The subcutaneous layer was reapproximated using 3-0 Vicryl interrupted suture. The skin was closed using staples. 0.5% Sensorcaine was instilled the surrounding wound. Betadine ointment dry sterile dressing were applied.  All tape and needle counts were correct at the end of the procedure. Patient was awakened and transferred to PACU in stable condition.   Complications:  None  EBL:  Minimal  Specimen:  None

## 2011-06-06 ENCOUNTER — Encounter (HOSPITAL_COMMUNITY): Payer: Self-pay | Admitting: General Surgery

## 2011-06-07 ENCOUNTER — Ambulatory Visit (HOSPITAL_COMMUNITY): Admission: RE | Admit: 2011-06-07 | Payer: Medicare Other | Source: Ambulatory Visit | Admitting: Internal Medicine

## 2011-06-07 ENCOUNTER — Encounter (HOSPITAL_COMMUNITY): Admission: RE | Payer: Self-pay | Source: Ambulatory Visit

## 2011-06-07 SURGERY — ESOPHAGOGASTRODUODENOSCOPY (EGD) WITH PROPOFOL
Anesthesia: Monitor Anesthesia Care

## 2011-09-06 ENCOUNTER — Encounter (HOSPITAL_COMMUNITY)
Admission: RE | Admit: 2011-09-06 | Discharge: 2011-09-06 | Disposition: A | Discharge status: Home / Self Care | Payer: Medicare Other | Source: Ambulatory Visit | Attending: General Surgery | Admitting: General Surgery

## 2011-09-06 ENCOUNTER — Encounter (HOSPITAL_COMMUNITY): Payer: Self-pay

## 2011-09-06 HISTORY — DX: Unspecified convulsions: R56.9

## 2011-09-06 HISTORY — DX: Gastro-esophageal reflux disease without esophagitis: K21.9

## 2011-09-06 LAB — CBC
Hemoglobin: 14.7 g/dL (ref 13.0–17.0)
Platelets: 317 10*3/uL (ref 150–400)
RBC: 4.67 MIL/uL (ref 4.22–5.81)
WBC: 8.6 10*3/uL (ref 4.0–10.5)

## 2011-09-06 LAB — BASIC METABOLIC PANEL
BUN: 16 mg/dL (ref 6–23)
Chloride: 103 mEq/L (ref 96–112)
GFR calc non Af Amer: >90 mL/min (ref >90)
Glucose, Bld: 100 mg/dL — ABNORMAL HIGH (ref 70–99)
Potassium: 4.1 mEq/L (ref 3.5–5.1)
Sodium: 142 mEq/L (ref 135–145)

## 2011-09-06 LAB — DIFFERENTIAL
Eosinophils Absolute: 0.3 10*3/uL (ref 0.0–0.7)
Lymphocytes Relative: 49 % — ABNORMAL HIGH (ref 12–46)
Lymphs Abs: 4.2 10*3/uL — ABNORMAL HIGH (ref 0.7–4.0)
Monocytes Relative: 7 % (ref 3–12)
Neutro Abs: 3.4 10*3/uL (ref 1.7–7.7)
Neutrophils Relative %: 40 % — ABNORMAL LOW (ref 43–77)
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
## 2011-09-06 NOTE — Patient Instructions (Addendum)
20 Darin Thomas  09/06/2011   Your procedure is scheduled on:  09/12/2011  Report to [facility] at  1045  AM.  Call this number if you have problems the morning of surgery: [phone]   Remember:   Do not eat food:After Midnight.  May have clear liquids:until Midnight .  Clear liquids include soda, tea, black coffee, apple or grape juice, broth.  Take these medicines the morning of surgery with A SIP OF WATER: paxil,adderall,depakote,prilosec   Do not wear jewelry, make-up or nail polish.  Do not wear lotions, powders, or perfumes. You may wear deodorant.  Do not shave 48 hours prior to surgery.  Do not bring valuables to the hospital.  Contacts, dentures or bridgework may not be worn into surgery.  Leave suitcase in the car. After surgery it may be brought to your room.  For patients admitted to the hospital, checkout time is 11:00 AM the day of discharge.   Patients discharged the day of surgery will not be allowed to drive home.  Name and phone number of your driver: family  Special Instructions: CHG Shower Use Special Wash: 1/2 bottle night before surgery and 1/2 bottle morning of surgery.   Please read over the following fact sheets that you were given: Pain Booklet, MRSA Information, Surgical Site Infection Prevention, Anesthesia Post-op Instructions and Care and Recovery After Surgery Hernia A hernia occurs when an internal organ pushes out through a weak spot in the abdominal wall. Hernias most commonly occur in the groin and around the navel. Hernias often can be pushed back into place (reduced). Most hernias tend to get worse over time. Some abdominal hernias can get stuck in the opening (irreducible or incarcerated hernia) and cannot be reduced. An irreducible abdominal hernia which is tightly squeezed into the opening is at risk for impaired blood supply (strangulated hernia). A strangulated hernia is a medical emergency. Because of the risk for an irreducible or strangulated  hernia, surgery may be recommended to repair a hernia. CAUSES   Heavy lifting.   Prolonged coughing.   Straining to have a bowel movement.   A cut (incision) made during an abdominal surgery.  HOME CARE INSTRUCTIONS   Bed rest is not required. You may continue your normal activities.   Avoid lifting more than 10 pounds (4.5 kg) or straining.   Cough gently. If you are a smoker it is best to stop. Even the best hernia repair can break down with the continual strain of coughing. Even if you do not have your hernia repaired, a cough will continue to aggravate the problem.   Do not wear anything tight over your hernia. Do not try to keep it in with an outside bandage or truss. These can damage abdominal contents if they are trapped within the hernia sac.   Eat a normal diet.   Avoid constipation. Straining over long periods of time will increase hernia size and encourage breakdown of repairs. If you cannot do this with diet alone, stool softeners may be used.  SEEK IMMEDIATE MEDICAL CARE IF:   You have a fever.   You develop increasing abdominal pain.   You feel nauseous or vomit.   Your hernia is stuck outside the abdomen, looks discolored, feels hard, or is tender.   You have any changes in your bowel habits or in the hernia that are unusual for you.   You have increased pain or swelling around the hernia.   You cannot push the hernia back  in place by applying gentle pressure while lying down.  MAKE SURE YOU:   Understand these instructions.   Will watch your condition.   Will get help right away if you are not doing well or get worse.  Document Released: 04/16/2005 Document Revised: 04/05/2011 Document Reviewed: 12/04/2007 [facility] Patient Information 2012 [place], [place].PATIENT INSTRUCTIONS POST-ANESTHESIA  IMMEDIATELY FOLLOWING SURGERY:  Do not drive or operate machinery for the first twenty four hours after surgery.  Do not make any important decisions for twenty  four hours after surgery or while taking narcotic pain medications or sedatives.  If you develop intractable nausea and vomiting or a severe headache please notify your doctor immediately.  FOLLOW-UP:  Please make an appointment with your surgeon as instructed. You do not need to follow up with anesthesia unless specifically instructed to do so.  WOUND CARE INSTRUCTIONS (if applicable):  Keep a dry clean dressing on the anesthesia/puncture wound site if there is drainage.  Once the wound has quit draining you may leave it open to air.  Generally you should leave the bandage intact for twenty four hours unless there is drainage.  If the epidural site drains for more than 36-48 hours please call the anesthesia department.  QUESTIONS?:  Please feel free to call your physician or the hospital operator if you have any questions, and they will be happy to assist you.     [facility] Anesthesia Department [address] [place] [phone]

## 2011-09-17 ENCOUNTER — Encounter (HOSPITAL_COMMUNITY)
Admission: RE | Disposition: A | Discharge status: Home / Self Care | Payer: Self-pay | Source: Ambulatory Visit | Attending: General Surgery

## 2011-09-17 ENCOUNTER — Encounter (HOSPITAL_COMMUNITY): Payer: Self-pay | Admitting: *Deleted

## 2011-09-17 ENCOUNTER — Encounter (HOSPITAL_COMMUNITY): Payer: Self-pay | Admitting: Anesthesiology

## 2011-09-17 ENCOUNTER — Ambulatory Visit (HOSPITAL_COMMUNITY): Payer: Medicare Other | Admitting: Anesthesiology

## 2011-09-17 ENCOUNTER — Ambulatory Visit (HOSPITAL_COMMUNITY)
Admission: RE | Admit: 2011-09-17 | Discharge: 2011-09-17 | Disposition: A | Discharge status: Home / Self Care | Payer: Medicare Other | Source: Ambulatory Visit | Attending: General Surgery | Admitting: General Surgery

## 2011-09-17 DIAGNOSIS — Z01812 Encounter for preprocedural laboratory examination: Secondary | ICD-10-CM | POA: Insufficient documentation

## 2011-09-17 DIAGNOSIS — J449 Chronic obstructive pulmonary disease, unspecified: Secondary | ICD-10-CM | POA: Insufficient documentation

## 2011-09-17 DIAGNOSIS — K432 Incisional hernia without obstruction or gangrene: Secondary | ICD-10-CM | POA: Insufficient documentation

## 2011-09-17 DIAGNOSIS — F17200 Nicotine dependence, unspecified, uncomplicated: Secondary | ICD-10-CM | POA: Insufficient documentation

## 2011-09-17 DIAGNOSIS — J4489 Other specified chronic obstructive pulmonary disease: Secondary | ICD-10-CM | POA: Insufficient documentation

## 2011-09-17 HISTORY — PX: INCISIONAL HERNIA REPAIR: SHX193

## 2011-09-17 SURGERY — REPAIR, HERNIA, INCISIONAL
Anesthesia: General | Site: Abdomen | Wound class: Clean

## 2011-09-17 MED ORDER — FENTANYL CITRATE 0.05 MG/ML IJ SOLN
INTRAMUSCULAR | Status: DC | PRN
  Administered 2011-09-17: 50 ug via INTRAVENOUS
  Administered 2011-09-17 (×2): 100 ug via INTRAVENOUS

## 2011-09-17 MED ORDER — ENOXAPARIN SODIUM 40 MG/0.4ML ~~LOC~~ SOLN
SUBCUTANEOUS | Status: AC
  Administered 2011-09-17: 40 mg via SUBCUTANEOUS
  Filled 2011-09-17: qty 0.4

## 2011-09-17 MED ORDER — PROPOFOL 10 MG/ML IV EMUL
INTRAVENOUS | Status: AC
  Filled 2011-09-17: qty 20

## 2011-09-17 MED ORDER — ONDANSETRON HCL 4 MG/2ML IJ SOLN
INTRAMUSCULAR | Status: AC
  Filled 2011-09-17: qty 2

## 2011-09-17 MED ORDER — GLYCOPYRROLATE 0.2 MG/ML IJ SOLN
INTRAMUSCULAR | Status: DC | PRN
  Administered 2011-09-17: .4 mg via INTRAVENOUS
  Administered 2011-09-17: 0.2 mg via INTRAVENOUS

## 2011-09-17 MED ORDER — LACTATED RINGERS IV SOLN
INTRAVENOUS | Status: DC
  Administered 2011-09-17: 1000 mL via INTRAVENOUS

## 2011-09-17 MED ORDER — LACTATED RINGERS IV SOLN
INTRAVENOUS | Status: DC | PRN
  Administered 2011-09-17: 11:00:00 via INTRAVENOUS

## 2011-09-17 MED ORDER — NEOSTIGMINE METHYLSULFATE 1 MG/ML IJ SOLN
INTRAMUSCULAR | Status: DC | PRN
  Administered 2011-09-17: 3 mg via INTRAVENOUS

## 2011-09-17 MED ORDER — ENOXAPARIN SODIUM 40 MG/0.4ML ~~LOC~~ SOLN
40.0000 mg | Freq: Once | SUBCUTANEOUS | Status: AC
  Administered 2011-09-17: 40 mg via SUBCUTANEOUS

## 2011-09-17 MED ORDER — BUPIVACAINE HCL (PF) 0.5 % IJ SOLN
INTRAMUSCULAR | Status: DC | PRN
  Administered 2011-09-17: 6 mL

## 2011-09-17 MED ORDER — PROPOFOL 10 MG/ML IV EMUL
INTRAVENOUS | Status: DC | PRN
  Administered 2011-09-17: 200 mg via INTRAVENOUS

## 2011-09-17 MED ORDER — GLYCOPYRROLATE 0.2 MG/ML IJ SOLN
INTRAMUSCULAR | Status: AC
  Filled 2011-09-17: qty 2

## 2011-09-17 MED ORDER — ONDANSETRON HCL 4 MG/2ML IJ SOLN: 4.0000 mg | Freq: Once | INTRAMUSCULAR | Status: DC | PRN

## 2011-09-17 MED ORDER — OXYCODONE-ACETAMINOPHEN 7.5-325 MG PO TABS: 1.0000 | ORAL_TABLET | ORAL | Status: DC | PRN

## 2011-09-17 MED ORDER — SUCCINYLCHOLINE CHLORIDE 20 MG/ML IJ SOLN
INTRAMUSCULAR | Status: AC
  Filled 2011-09-17: qty 1

## 2011-09-17 MED ORDER — ONDANSETRON HCL 4 MG/2ML IJ SOLN
4.0000 mg | Freq: Once | INTRAMUSCULAR | Status: AC
  Administered 2011-09-17: 4 mg via INTRAVENOUS

## 2011-09-17 MED ORDER — SUCCINYLCHOLINE CHLORIDE 20 MG/ML IJ SOLN
INTRAMUSCULAR | Status: DC | PRN
  Administered 2011-09-17: 100 mg via INTRAVENOUS

## 2011-09-17 MED ORDER — MIDAZOLAM HCL 2 MG/2ML IJ SOLN
INTRAMUSCULAR | Status: AC
  Administered 2011-09-17: 2 mg via INTRAVENOUS
  Filled 2011-09-17: qty 2

## 2011-09-17 MED ORDER — FENTANYL CITRATE 0.05 MG/ML IJ SOLN
INTRAMUSCULAR | Status: AC
  Administered 2011-09-17: 50 ug via INTRAVENOUS
  Filled 2011-09-17: qty 2

## 2011-09-17 MED ORDER — FENTANYL CITRATE 0.05 MG/ML IJ SOLN
INTRAMUSCULAR | Status: AC
  Administered 2011-09-17: 50 ug via INTRAVENOUS
  Filled 2011-09-17: qty 5

## 2011-09-17 MED ORDER — BUPIVACAINE HCL (PF) 0.5 % IJ SOLN
INTRAMUSCULAR | Status: AC
  Filled 2011-09-17: qty 30

## 2011-09-17 MED ORDER — 0.9 % SODIUM CHLORIDE (POUR BTL) OPTIME
TOPICAL | Status: DC | PRN
  Administered 2011-09-17: 1000 mL

## 2011-09-17 MED ORDER — CEFAZOLIN SODIUM 1-5 GM-% IV SOLN
INTRAVENOUS | Status: AC
  Administered 2011-09-17: 1 g via INTRAVENOUS
  Filled 2011-09-17: qty 50

## 2011-09-17 MED ORDER — CEFAZOLIN SODIUM 1-5 GM-% IV SOLN: 1.0000 g | INTRAVENOUS | Status: DC

## 2011-09-17 MED ORDER — MIDAZOLAM HCL 2 MG/2ML IJ SOLN
INTRAMUSCULAR | Status: AC
  Filled 2011-09-17: qty 2

## 2011-09-17 MED ORDER — NEOSTIGMINE METHYLSULFATE 1 MG/ML IJ SOLN
INTRAMUSCULAR | Status: AC
  Filled 2011-09-17: qty 10

## 2011-09-17 MED ORDER — FENTANYL CITRATE 0.05 MG/ML IJ SOLN
25.0000 ug | INTRAMUSCULAR | Status: DC | PRN
  Administered 2011-09-17 (×4): 50 ug via INTRAVENOUS

## 2011-09-17 MED ORDER — MIDAZOLAM HCL 2 MG/2ML IJ SOLN
1.0000 mg | INTRAMUSCULAR | Status: DC | PRN
  Administered 2011-09-17: 2 mg via INTRAVENOUS

## 2011-09-17 MED ORDER — ROCURONIUM BROMIDE 100 MG/10ML IV SOLN
INTRAVENOUS | Status: DC | PRN
  Administered 2011-09-17: 20 mg via INTRAVENOUS

## 2011-09-17 MED ORDER — MIDAZOLAM HCL 5 MG/5ML IJ SOLN
INTRAMUSCULAR | Status: DC | PRN
  Administered 2011-09-17: 2 mg via INTRAVENOUS

## 2011-09-17 SURGICAL SUPPLY — 37 items
BAG HAMPER (MISCELLANEOUS) ×3 IMPLANT
BLADE SURG SZ11 CARB STEEL (BLADE) ×3 IMPLANT
CLOTH BEACON ORANGE TIMEOUT ST (SAFETY) ×3 IMPLANT
COVER LIGHT HANDLE STERIS (MISCELLANEOUS) ×9 IMPLANT
DECANTER SPIKE VIAL GLASS SM (MISCELLANEOUS) ×3 IMPLANT
DERMABOND ADVANCED (GAUZE/BANDAGES/DRESSINGS)
DERMABOND ADVANCED .7 DNX12 (GAUZE/BANDAGES/DRESSINGS) IMPLANT
DURAPREP 26ML APPLICATOR (WOUND CARE) ×3 IMPLANT
ELECT REM PT RETURN 9FT ADLT (ELECTROSURGICAL) ×3
ELECTRODE REM PT RTRN 9FT ADLT (ELECTROSURGICAL) ×2 IMPLANT
FORMALIN 10 PREFIL 120ML (MISCELLANEOUS) IMPLANT
GLOVE BIO SURGEON STRL SZ7.5 (GLOVE) ×3 IMPLANT
GLOVE ECLIPSE 6.5 STRL STRAW (GLOVE) ×3 IMPLANT
GLOVE EXAM NITRILE MD LF STRL (GLOVE) ×3 IMPLANT
GLOVE INDICATOR 7.0 STRL GRN (GLOVE) ×3 IMPLANT
GOWN STRL REIN XL XLG (GOWN DISPOSABLE) ×9 IMPLANT
INST SET MINOR GENERAL (KITS) ×3 IMPLANT
KIT ROOM TURNOVER APOR (KITS) ×3 IMPLANT
MANIFOLD NEPTUNE II (INSTRUMENTS) ×3 IMPLANT
NEEDLE HYPO 25X1 1.5 SAFETY (NEEDLE) ×3 IMPLANT
NS IRRIG 1000ML POUR BTL (IV SOLUTION) ×3 IMPLANT
PACK MINOR (CUSTOM PROCEDURE TRAY) ×3 IMPLANT
PAD ARMBOARD 7.5X6 YLW CONV (MISCELLANEOUS) ×3 IMPLANT
PATCH VENTRAL SMALL 4.3 (Mesh Specialty) ×3 IMPLANT
SET BASIN LINEN APH (SET/KITS/TRAYS/PACK) ×3 IMPLANT
SPONGE GAUZE 2X2 8PLY STRL LF (GAUZE/BANDAGES/DRESSINGS) ×3 IMPLANT
STAPLER VISISTAT (STAPLE) IMPLANT
SUT ETHIBOND NAB MO 7 #0 18IN (SUTURE) ×3 IMPLANT
SUT VIC AB 2-0 CT2 27 (SUTURE) IMPLANT
SUT VIC AB 3-0 SH 27 (SUTURE) ×1
SUT VIC AB 3-0 SH 27X BRD (SUTURE) ×2 IMPLANT
SUT VIC AB 4-0 PS2 27 (SUTURE) IMPLANT
SUT VIC AB 5-0 P-3 18X BRD (SUTURE) IMPLANT
SUT VIC AB 5-0 P3 18 (SUTURE)
SUT VICRYL AB 3 0 TIES (SUTURE) IMPLANT
SYR CONTROL 10ML LL (SYRINGE) ×3 IMPLANT
TAPE CLOTH SURG 4X10 WHT LF (GAUZE/BANDAGES/DRESSINGS) ×3 IMPLANT

## 2011-09-17 NOTE — Anesthesia Postprocedure Evaluation (Signed)
  Anesthesia Post-op Note  Patient: Darin Thomas  Procedure(s) Performed: Procedure(s) (LRB): HERNIA REPAIR INCISIONAL (N/A)  Patient Location: PACU  Anesthesia Type: General  Level of Consciousness: awake, alert  and oriented  Airway and Oxygen Therapy: Patient Spontanous Breathing and Patient connected to face mask oxygen  Post-op Pain: none  Post-op Assessment: Post-op Vital signs reviewed, Patient's Cardiovascular Status Stable, Respiratory Function Stable, Patent Airway, No signs of Nausea or vomiting and Pain level controlled  Post-op Vital Signs: Reviewed  Complications: No apparent anesthesia complications

## 2011-09-17 NOTE — Addendum Note (Signed)
Addendum  created 09/17/11 1238 by Theodosia Quay, CRNA   Modules edited:Anesthesia Medication Administration

## 2011-09-17 NOTE — Addendum Note (Signed)
Addendum  created 09/17/11 1238 by Theodosia Quay, CRNA   Modules edited:Anesthesia Medication Administration, Charges VN

## 2011-09-17 NOTE — Transfer of Care (Signed)
Immediate Anesthesia Transfer of Care Note  Patient: Darin Thomas  Procedure(s) Performed: Procedure(s) (LRB): HERNIA REPAIR INCISIONAL (N/A)  Patient Location: PACU  Anesthesia Type: General  Level of Consciousness: awake, alert  and oriented  Airway & Oxygen Therapy: Patient Spontanous Breathing and Patient connected to face mask oxygen  Post-op Assessment: Report given to PACU RN  Post vital signs: Reviewed  Complications: No apparent anesthesia complications
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
## 2011-09-17 NOTE — Interval H&P Note (Signed)
History and Physical Interval Note:  09/17/2011 10:26 AM  Darin Thomas  has presented today for surgery, with the diagnosis of Umbilical hernia without mention of obstruction or gangrene   The various methods of treatment have been discussed with the patient and family. After consideration of risks, benefits and other options for treatment, the patient has consented to  Procedure(s) (LRB): HERNIA REPAIR UMBILICAL ADULT (N/A) as a surgical intervention .  The patients' history has been reviewed, patient examined, no change in status, stable for surgery.  I have reviewed the patients' chart and labs.  Questions were answered to the patient's satisfaction.     [name] A

## 2011-09-17 NOTE — Anesthesia Preprocedure Evaluation (Signed)
Anesthesia Evaluation  Patient identified by MRN, date of birth, ID band Patient awake    Reviewed: Allergy & Precautions, H&P , NPO status , Patient's Chart, lab work & pertinent test results  History of Anesthesia Complications Negative for: history of anesthetic complications  Airway Mallampati: I      Dental  (+) Edentulous Upper and Edentulous Lower   Pulmonary COPD (hx asbestosis)Current Smoker (chronic cough),    Pulmonary exam normal       Cardiovascular negative cardio ROS  Rhythm:Regular Rate:Normal     Neuro/Psych PSYCHIATRIC DISORDERS (ADHD) Anxiety Depression Bipolar Disorder    GI/Hepatic PUD, GERD-  Medicated and Controlled,  Endo/Other    Renal/GU      Musculoskeletal   Abdominal   Peds  Hematology   Anesthesia Other Findings   Reproductive/Obstetrics                           Anesthesia Physical Anesthesia Plan  ASA: III  Anesthesia Plan: General   Post-op Pain Management:    Induction: Intravenous, Rapid sequence and Cricoid pressure planned  Airway Management Planned: Oral ETT  Additional Equipment:   Intra-op Plan:   Post-operative Plan: Extubation in OR  Informed Consent: I have reviewed the patients History and Physical, chart, labs and discussed the procedure including the risks, benefits and alternatives for the proposed anesthesia with the patient or authorized representative who has indicated his/her understanding and acceptance.     Plan Discussed with:   Anesthesia Plan Comments:         Anesthesia Quick Evaluation

## 2011-09-17 NOTE — Preoperative (Signed)
Beta Blockers   Reason not to administer Beta Blockers:Not Applicable 

## 2011-09-19 ENCOUNTER — Encounter (HOSPITAL_COMMUNITY): Payer: Self-pay | Admitting: *Deleted

## 2011-09-19 ENCOUNTER — Emergency Department (HOSPITAL_COMMUNITY): Payer: Medicare Other

## 2011-09-19 ENCOUNTER — Emergency Department (HOSPITAL_COMMUNITY)
Admission: EM | Admit: 2011-09-19 | Discharge: 2011-09-19 | Disposition: A | Discharge status: Home / Self Care | Payer: Medicare Other | Attending: Emergency Medicine | Admitting: Emergency Medicine

## 2011-09-19 DIAGNOSIS — F17200 Nicotine dependence, unspecified, uncomplicated: Secondary | ICD-10-CM | POA: Insufficient documentation

## 2011-09-19 DIAGNOSIS — R10819 Abdominal tenderness, unspecified site: Secondary | ICD-10-CM | POA: Insufficient documentation

## 2011-09-19 DIAGNOSIS — G8918 Other acute postprocedural pain: Secondary | ICD-10-CM | POA: Insufficient documentation

## 2011-09-19 DIAGNOSIS — F319 Bipolar disorder, unspecified: Secondary | ICD-10-CM | POA: Insufficient documentation

## 2011-09-19 DIAGNOSIS — R569 Unspecified convulsions: Secondary | ICD-10-CM | POA: Insufficient documentation

## 2011-09-19 LAB — COMPREHENSIVE METABOLIC PANEL
BUN: 11 mg/dL (ref 6–23)
CO2: 26 mEq/L (ref 19–32)
Calcium: 9.3 mg/dL (ref 8.4–10.5)
Creatinine, Ser: 0.78 mg/dL (ref 0.50–1.35)
GFR calc Af Amer: >90 mL/min (ref >90)
GFR calc non Af Amer: >90 mL/min (ref >90)
Glucose, Bld: 117 mg/dL — ABNORMAL HIGH (ref 70–99)
Sodium: 138 mEq/L (ref 135–145)
Total Protein: 6.6 g/dL (ref 6.0–8.3)

## 2011-09-19 LAB — URINALYSIS, ROUTINE W REFLEX MICROSCOPIC
Nitrite: NEGATIVE
Protein, ur: NEGATIVE mg/dL
Specific Gravity, Urine: <1.005 — ABNORMAL LOW (ref 1.005–1.030)
Urobilinogen, UA: 0.2 mg/dL (ref 0.0–1.0)

## 2011-09-19 LAB — CBC
HCT: 38.2 % — ABNORMAL LOW (ref 39.0–52.0)
MCH: 31.8 pg (ref 26.0–34.0)
MCV: 89.3 fL (ref 78.0–100.0)
Platelets: 255 10*3/uL (ref 150–400)
RBC: 4.28 MIL/uL (ref 4.22–5.81)

## 2011-09-19 LAB — DIFFERENTIAL
Eosinophils Absolute: 0.5 10*3/uL (ref 0.0–0.7)
Eosinophils Relative: 4 % (ref 0–5)
Lymphs Abs: 3.8 10*3/uL (ref 0.7–4.0)
Monocytes Absolute: 0.8 10*3/uL (ref 0.1–1.0)
Monocytes Relative: 7 % (ref 3–12)

## 2011-09-19 LAB — LIPASE, BLOOD: Lipase: 17 U/L (ref 11–59)

## 2011-09-19 MED ORDER — ONDANSETRON HCL 4 MG/2ML IJ SOLN
4.0000 mg | Freq: Once | INTRAMUSCULAR | Status: AC
  Administered 2011-09-19: 4 mg via INTRAVENOUS
  Filled 2011-09-19: qty 2

## 2011-09-19 MED ORDER — SODIUM CHLORIDE 0.9 % IV BOLUS (SEPSIS)
1000.0000 mL | Freq: Once | INTRAVENOUS | Status: AC
  Administered 2011-09-19: 1000 mL via INTRAVENOUS

## 2011-09-19 MED ORDER — HYDROMORPHONE HCL PF 1 MG/ML IJ SOLN
1.0000 mg | Freq: Once | INTRAMUSCULAR | Status: AC
  Administered 2011-09-19: 1 mg via INTRAVENOUS
  Filled 2011-09-19: qty 1

## 2011-09-19 MED ORDER — KETOROLAC TROMETHAMINE 60 MG/2ML IM SOLN
60.0000 mg | Freq: Once | INTRAMUSCULAR | Status: AC
  Administered 2011-09-19: 60 mg via INTRAMUSCULAR
  Filled 2011-09-19: qty 2

## 2011-09-19 NOTE — ED Provider Notes (Signed)
History  This chart was scribed for Glynn Octave, MD by Bennett Scrape. This patient was seen in room APA03/APA03 and the patient's care was started at 5:32PM.  CSN: [id number]  Arrival date & time 09/19/11  1642   First MD Initiated Contact with Patient 09/19/11 1732      Chief Complaint  Patient presents with  . Pain    The history is provided by the patient. No language interpreter was used.    Darin Thomas is a 36 y.o. male who presents to the Emergency Department complaining of increased hernia surgery pain after his 20 lb dog jumped on his abdomen today with one episode of associated emessis. Pt states that he had a hernia surgery done by Dr. Lovell Sheehan 2 days ago. He states that he was having controlled pain with the percocet. He reports that he immediately vomited after his dog jumped on him but has not vomited since. He denies having any bleeding or drainage prior to or after the incident. He reports taking a percocet at home after the incident with mild improvement in pain. He denies fever, chest pain, and back pain as associated symptoms. He has a h/o arthritis, bipolar disorder, ADHD and GERD. He is a current everyday smoker and alcohol user.  Past Medical History  Diagnosis Date  . Arthritis     right ankle  . Anxiety   . Bipolar disorder     Psychiatrist  . ADHD (attention deficit hyperactivity disorder)   . Depression   . Bipolar disorder   . PUD (peptic ulcer disease) 01/2011  . ADHD (attention deficit hyperactivity disorder)   . GERD (gastroesophageal reflux disease)   . 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 Ankle surgery '09,'10,'11    right  . Rotator cuff repair     right  . Vasectomy   . Tonsillectomy   . Esophagogastroduodenoscopy 02/08/11    peptic ulcer dx with gastric erosions/small hiatal hernia polypoid gastric lesion. Bx showed chronic gastritis, no h.pylori  . Incisional hernia  repair 06/04/2011    Procedure: HERNIA REPAIR INCISIONAL;  Surgeon: Dalia Heading, MD;  Location: [facility];  Service: General;  Laterality: N/A;  . Hernia repair     umbilical x2    Family History  Problem Relation Age of Onset  . Cancer Mother     malignant melanoma  . Bipolar disorder Mother   . Cancer Father     lung, abestosis  . Stroke Father   . Colon cancer Neg Hx   . Anesthesia problems Neg Hx   . Hypotension Neg Hx   . Malignant hyperthermia Neg Hx   . Pseudochol deficiency Neg Hx     History  Substance Use Topics  . Smoking status: Current Everyday Smoker -- 2.0 packs/day for 20 years    Types: Cigarettes  . Smokeless tobacco: Not on file  . Alcohol Use: Yes     2 cans of beere 2 days ago-before that 3 years      Review of Systems  A complete 10 system review of systems was obtained and all systems are negative except as noted in the HPI and PMH.   Allergies  Ultram; Adhesive; Diclofenac sodium; and Latex  Home Medications   Current Outpatient Rx  Name Route Sig Dispense Refill  . DIPHENHYDRAMINE HCL 25 MG PO CAPS Oral Take 50 mg by mouth at bedtime as needed.  For sleep    . DIVALPROEX SODIUM ER 500 MG PO TB24 Oral Take 1,000 mg by mouth at bedtime.     Marland Kitchen MELATONIN 3 MG PO TABS Oral Take 9 mg by mouth at bedtime.    . OMEPRAZOLE 20 MG PO CPDR Oral Take 1 capsule (20 mg total) by mouth 2 (two) times daily. For reflux 60 capsule 11  . OXYCODONE-ACETAMINOPHEN 7.5-325 MG PO TABS Oral Take 1-2 tablets by mouth every 4 (four) hours as needed. For pain 60 tablet 0  . PAROXETINE HCL 40 MG PO TABS Oral Take 40 mg by mouth at bedtime.     Marland Kitchen SILVER SULFADIAZINE 1 % EX CREA Topical Apply 1 application topically 2 (two) times daily as needed. For burn    . ZOLPIDEM TARTRATE 5 MG PO TABS Oral Take 2.5 mg by mouth at bedtime.      Triage Vitals: BP 126/80  Pulse 79  Temp(Src) 98.4 F (36.9 C) (Oral)  Resp 20  Ht 6' (1.829 m)  Wt 170 lb (77.111 kg)  BMI 23.06 kg/m2   SpO2 100%  Physical Exam  Nursing note and vitals reviewed. Constitutional: He is oriented to person, place, and time. He appears well-developed and well-nourished. No distress.  HENT:  Head: Normocephalic and atraumatic.  Eyes: Conjunctivae and EOM are normal.  Neck: Normal range of motion. Neck supple. No tracheal deviation present.  Cardiovascular: Normal rate, regular rhythm and normal heart sounds.   Pulmonary/Chest: Effort normal and breath sounds normal. No respiratory distress.  Abdominal: Soft. There is tenderness. There is guarding. There is no rebound.       Umbilical incision with staples, no redness, no bleeding, superficial abrasions to the abdomen, pain to LLQ with guarding supra pubically   Musculoskeletal: Normal range of motion. He exhibits no edema.  Neurological: He is alert and oriented to person, place, and time.  Skin: Skin is warm and dry.  Psychiatric: He has a normal mood and affect. His behavior is normal.    ED Course  Procedures (including critical care time)  DIAGNOSTIC STUDIES: Oxygen Saturation is 100% on room air, normal by my interpretation.    COORDINATION OF CARE: 5:44PM-Discussed calling Dr. Lovell Sheehan to determine treatment plan with pt and pt agreed. 7:01Pm-Informed pt that I had called Dr. Lovell Sheehan and that I am still waiting on his all blood test results to come back.  Labs Reviewed  CBC - Abnormal; Notable for the following:    WBC 11.9 (*)    HCT 38.2 (*)    All other components within normal limits  COMPREHENSIVE METABOLIC PANEL - Abnormal; Notable for the following:    Glucose, Bld 117 (*)    All other components within normal limits  DIFFERENTIAL  LIPASE, BLOOD  URINALYSIS, ROUTINE W REFLEX MICROSCOPIC   No results found.   No diagnosis found.    MDM  Status post recurrent local hernia repair 2 days ago presenting with increased abdominal pain after his 20 pound dog jumped on his abdomen. No bleeding or drainage from sites.  Incision healing well.  Labs obtained. Abdomen soft with tenderness suprapubically and left lower quadrant.  Discussed with Dr. Lovell Sheehan. He doubts any intra-abdominal pathology does not think imaging indicated. He did not enter the abdomen and does not think a 20 pound dog would cause any damage to the mesh. Patient has issues with chronic pain.  I personally performed the services described in this documentation, which was scribed in my presence.  The  recorded information has been reviewed and considered.       Glynn Octave, MD 09/19/11 2020

## 2011-09-19 NOTE — Discharge Instructions (Signed)

## 2011-09-19 NOTE — ED Notes (Signed)
Pt c/o hernia repair surgery on Monday, states that the pain has increased after dog had accidentally jumped on abd area.

## 2011-09-19 NOTE — ED Notes (Signed)
States that he has had no BM since surgery

## 2011-09-20 ENCOUNTER — Encounter (HOSPITAL_COMMUNITY): Payer: Self-pay | Admitting: General Surgery

## 2012-02-09 ENCOUNTER — Emergency Department (HOSPITAL_COMMUNITY)
Admission: EM | Admit: 2012-02-09 | Discharge: 2012-02-10 | Disposition: A | Discharge status: Home / Self Care | Payer: Medicare Other | Attending: Emergency Medicine | Admitting: Emergency Medicine

## 2012-02-09 ENCOUNTER — Encounter (HOSPITAL_COMMUNITY): Payer: Self-pay | Admitting: *Deleted

## 2012-02-09 DIAGNOSIS — S1093XA Contusion of unspecified part of neck, initial encounter: Secondary | ICD-10-CM | POA: Insufficient documentation

## 2012-02-09 DIAGNOSIS — Z860100 Personal history of colon polyps, unspecified: Secondary | ICD-10-CM | POA: Insufficient documentation

## 2012-02-09 DIAGNOSIS — F1920 Other psychoactive substance dependence, uncomplicated: Secondary | ICD-10-CM | POA: Insufficient documentation

## 2012-02-09 DIAGNOSIS — W19XXXA Unspecified fall, initial encounter: Secondary | ICD-10-CM | POA: Insufficient documentation

## 2012-02-09 DIAGNOSIS — S0003XA Contusion of scalp, initial encounter: Secondary | ICD-10-CM | POA: Insufficient documentation

## 2012-02-09 DIAGNOSIS — M19079 Primary osteoarthritis, unspecified ankle and foot: Secondary | ICD-10-CM | POA: Insufficient documentation

## 2012-02-09 DIAGNOSIS — E876 Hypokalemia: Secondary | ICD-10-CM | POA: Insufficient documentation

## 2012-02-09 DIAGNOSIS — F17200 Nicotine dependence, unspecified, uncomplicated: Secondary | ICD-10-CM | POA: Insufficient documentation

## 2012-02-09 DIAGNOSIS — F909 Attention-deficit hyperactivity disorder, unspecified type: Secondary | ICD-10-CM | POA: Insufficient documentation

## 2012-02-09 DIAGNOSIS — F3130 Bipolar disorder, current episode depressed, mild or moderate severity, unspecified: Secondary | ICD-10-CM | POA: Insufficient documentation

## 2012-02-09 DIAGNOSIS — Z79899 Other long term (current) drug therapy: Secondary | ICD-10-CM | POA: Insufficient documentation

## 2012-02-09 DIAGNOSIS — F1910 Other psychoactive substance abuse, uncomplicated: Secondary | ICD-10-CM

## 2012-02-09 DIAGNOSIS — K219 Gastro-esophageal reflux disease without esophagitis: Secondary | ICD-10-CM | POA: Insufficient documentation

## 2012-02-09 DIAGNOSIS — Z9181 History of falling: Secondary | ICD-10-CM | POA: Insufficient documentation

## 2012-02-09 DIAGNOSIS — F411 Generalized anxiety disorder: Secondary | ICD-10-CM | POA: Insufficient documentation

## 2012-02-09 NOTE — ED Notes (Addendum)
States he was moving some furniture at home and thinks he may have "pulled his back out."  States he has radiating pain down his hips.  States he has fell 6 times, states he has been smoking marijuana tonight.  Daughter states that the patient has a history of degenerative disk disease.  Patient's pupils are mildly dilated.  Pt appears to have unsteady upper body balance while positioning himself in the bed.

## 2012-02-09 NOTE — ED Notes (Signed)
Pt to department via EMS due to falling several times today.  Reports taking 2 ativan today, also reports 1 Vicodin.  Pt denies ETOH.

## 2012-02-09 NOTE — ED Notes (Addendum)
Pt ambulatory around the department at this time. Pt is also wanting to go out to smoke pt was directed back to his room.

## 2012-02-10 ENCOUNTER — Emergency Department (HOSPITAL_COMMUNITY): Payer: Medicare Other

## 2012-02-10 LAB — CBC WITH DIFFERENTIAL/PLATELET
Eosinophils Absolute: 0.3 10*3/uL (ref 0.0–0.7)
Eosinophils Relative: 3 % (ref 0–5)
HCT: 38.9 % — ABNORMAL LOW (ref 39.0–52.0)
Hemoglobin: 13.6 g/dL (ref 13.0–17.0)
Lymphocytes Relative: 43 % (ref 12–46)
Lymphs Abs: 4.3 10*3/uL — ABNORMAL HIGH (ref 0.7–4.0)
MCH: 32.1 pg (ref 26.0–34.0)
MCV: 91.7 fL (ref 78.0–100.0)
Monocytes Absolute: 0.6 10*3/uL (ref 0.1–1.0)
Monocytes Relative: 6 % (ref 3–12)
Platelets: 278 10*3/uL (ref 150–400)
RBC: 4.24 MIL/uL (ref 4.22–5.81)
WBC: 9.9 10*3/uL (ref 4.0–10.5)

## 2012-02-10 LAB — RAPID URINE DRUG SCREEN, HOSP PERFORMED
Amphetamines: NOT DETECTED
Barbiturates: NOT DETECTED
Cocaine: POSITIVE — AB
Opiates: POSITIVE — AB
Tetrahydrocannabinol: POSITIVE — AB

## 2012-02-10 LAB — URINALYSIS, ROUTINE W REFLEX MICROSCOPIC
Bilirubin Urine: NEGATIVE
Glucose, UA: NEGATIVE mg/dL
Hgb urine dipstick: NEGATIVE
Protein, ur: NEGATIVE mg/dL

## 2012-02-10 LAB — BASIC METABOLIC PANEL
BUN: 13 mg/dL (ref 6–23)
CO2: 29 mEq/L (ref 19–32)
Calcium: 8.9 mg/dL (ref 8.4–10.5)
GFR calc non Af Amer: >90 mL/min (ref >90)
Glucose, Bld: 141 mg/dL — ABNORMAL HIGH (ref 70–99)
Sodium: 138 mEq/L (ref 135–145)

## 2012-02-10 LAB — VALPROIC ACID LEVEL: Valproic Acid Lvl: 31.6 ug/mL — ABNORMAL LOW (ref 50.0–100.0)

## 2012-02-10 MED ORDER — POTASSIUM CHLORIDE CRYS ER 20 MEQ PO TBCR
60.0000 meq | EXTENDED_RELEASE_TABLET | Freq: Once | ORAL | Status: AC
  Administered 2012-02-10: 60 meq via ORAL
  Filled 2012-02-10: qty 3

## 2012-02-10 NOTE — ED Notes (Signed)
Patient states he needs to use the restroom, urinal provided and patient told not to stand up unassisted, male technician paged to room to assist.

## 2012-02-10 NOTE — ED Notes (Signed)
CRITICAL VALUE ALERT  Critical value received:  Potassium  Date of notification:  02/10/2012  Time of notification:  0118  Critical value read back:yes  Nurse who received alert:  Hardie Pulley, RN  MD notified (1st page):  Colon Branch  Time of first page:  0118  MD notified (2nd page):  Time of second page:  Responding MD:  Colon Branch  Time MD responded:  [phone]

## 2012-02-10 NOTE — ED Notes (Signed)
Spoke with daughter at bedside who states that the patient has taken some of his nighttime medications, but she is unsure which ones he has taken.

## 2012-02-10 NOTE — ED Provider Notes (Signed)
History     CSN: [id number]  Arrival date & time 02/09/12  2325   First MD Initiated Contact with Patient 02/10/12 0030      Chief Complaint  Patient presents with  . Fall    (Consider location/radiation/quality/duration/timing/severity/associated sxs/prior treatment) HPI  Darin Thomas is a 36 y.o. male brought in by ambulance, who presents to the Emergency Department complaining of falling several times today associated with weakness, loss of appetite x 3 days, and mild confusion. He has taken 2 ativan, 1 percocet and his Remus Loffler before coming to the ER. He denies vision changes, hearing changes, difficulty talking or swallowing, stiff neck, fever, chills, shortness of breath, cough, nausea, vomiting, diarrhea.   PCP Dr. Shelva Majestic   Past Medical History  Diagnosis Date  . Arthritis     right ankle  . Anxiety   . Bipolar disorder     Psychiatrist  . ADHD (attention deficit hyperactivity disorder)   . Depression   . Bipolar disorder   . PUD (peptic ulcer disease) 01/2011  . ADHD (attention deficit hyperactivity disorder)   . GERD (gastroesophageal reflux disease)   . 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 Ankle surgery '09,'10,'11    right  . Rotator cuff repair     right  . Vasectomy   . Tonsillectomy   . Esophagogastroduodenoscopy 02/08/11    peptic ulcer dx with gastric erosions/small hiatal hernia polypoid gastric lesion. Bx showed chronic gastritis, no h.pylori  . Incisional hernia repair 06/04/2011    Procedure: HERNIA REPAIR INCISIONAL;  Surgeon: Dalia Heading, MD;  Location: [facility];  Service: General;  Laterality: N/A;  . Hernia repair     umbilical x2  . Incisional hernia repair 09/17/2011    Procedure: HERNIA REPAIR INCISIONAL;  Surgeon: Dalia Heading, MD;  Location: [facility];  Service: General;  Laterality: N/A;  Recurrent Incisional Herniorraphy with Mesh    Family History  Problem Relation Age of  Onset  . Cancer Mother     malignant melanoma  . Bipolar disorder Mother   . Cancer Father     lung, abestosis  . Stroke Father   . Colon cancer Neg Hx   . Anesthesia problems Neg Hx   . Hypotension Neg Hx   . Malignant hyperthermia Neg Hx   . Pseudochol deficiency Neg Hx     History  Substance Use Topics  . Smoking status: Current Every Day Smoker -- 2.0 packs/day for 20 years    Types: Cigarettes  . Smokeless tobacco: Not on file  . Alcohol Use: No     2 cans of beere 2 days ago-before that 3 years      Review of Systems  Constitutional: Positive for appetite change and fatigue. Negative for fever.       10 Systems reviewed and are negative for acute change except as noted in the HPI.  HENT: Negative for congestion.   Eyes: Negative for discharge and redness.  Respiratory: Negative for cough and shortness of breath.   Cardiovascular: Negative for chest pain.  Gastrointestinal: Negative for vomiting and abdominal pain.  Musculoskeletal: Negative for back pain.       Falling  Skin: Negative for rash.  Neurological: Negative for syncope, numbness and headaches.  Psychiatric/Behavioral:       No behavior change.    Allergies  Ultram; Adhesive; Diclofenac sodium; and Latex  Home Medications  Current Outpatient Rx  Name Route Sig Dispense Refill  . DIPHENHYDRAMINE HCL 25 MG PO CAPS Oral Take 50 mg by mouth at bedtime as needed. For sleep    . DIVALPROEX SODIUM ER 500 MG PO TB24 Oral Take 1,000 mg by mouth at bedtime.     Marland Kitchen LORATADINE 10 MG PO TABS Oral Take 20 mg by mouth daily as needed. For allergies    . MELATONIN 3 MG PO TABS Oral Take 9 mg by mouth at bedtime.    Marland Kitchen NAPHAZOLINE-PHENIRAMINE 0.025-0.3 % OP SOLN Both Eyes Place 1 drop into both eyes daily as needed. For relief    . OMEPRAZOLE 20 MG PO CPDR Oral Take 1 capsule (20 mg total) by mouth 2 (two) times daily. For reflux 60 capsule 11  . OXYCODONE-ACETAMINOPHEN 7.5-325 MG PO TABS Oral Take 1-2 tablets by  mouth every 4 (four) hours as needed. For pain 60 tablet 0  . PAROXETINE HCL 40 MG PO TABS Oral Take 40 mg by mouth at bedtime.     Marland Kitchen SILVER SULFADIAZINE 1 % EX CREA Topical Apply 1 application topically 2 (two) times daily as needed. For burn    . ZOLPIDEM TARTRATE 5 MG PO TABS Oral Take 2.5 mg by mouth at bedtime.      BP 123/70  Pulse 74  Temp 98 F (36.7 C)  Resp 16  Ht 6' (1.829 m)  Wt 180 lb (81.647 kg)  BMI 24.41 kg/m2  SpO2 97%  Physical Exam  Nursing note and vitals reviewed. Constitutional:       Sleepy, arouses with vigorous shaking. Answers questions appropriately. Falls back to sleep.  HENT:  Right Ear: External ear normal.  Left Ear: External ear normal.  Nose: Nose normal.  Mouth/Throat: Oropharynx is clear and moist.       Contusion to center of forehead. Abrasion to left periorbital area.  Eyes: Right eye exhibits no discharge. Left eye exhibits no discharge.  Neck: Normal range of motion. Neck supple.  Cardiovascular: Normal heart sounds.   Pulmonary/Chest: Effort normal and breath sounds normal. He exhibits no tenderness.  Abdominal: Soft. Bowel sounds are normal. There is no tenderness. There is no rebound.  Musculoskeletal: Normal range of motion. He exhibits no tenderness.       Baseline ROM, no obvious new focal weakness.  Neurological: He has normal reflexes.       Mental status and motor strength appears baseline for patient and situation.No facial asymetry, speech normal.  Skin: Skin is warm and dry. No rash noted.  Psychiatric: He has a normal mood and affect.    ED Course  Procedures (including critical care time) Results for orders placed during the hospital encounter of 02/09/12  CBC WITH DIFFERENTIAL      Component Value Range   WBC 9.9  4.0 - 10.5 K/uL   RBC 4.24  4.22 - 5.81 MIL/uL   Hemoglobin 13.6  13.0 - 17.0 g/dL   HCT 16.1 (*) 09.6 - 04.5 %   MCV 91.7  78.0 - 100.0 fL   MCH 32.1  26.0 - 34.0 pg   MCHC 35.0  30.0 - 36.0 g/dL    RDW 40.9  81.1 - 91.4 %   Platelets 278  150 - 400 K/uL   Neutrophils Relative 47  43 - 77 %   Neutro Abs 4.6  1.7 - 7.7 K/uL   Lymphocytes Relative 43  12 - 46 %   Lymphs Abs 4.3 (*) 0.7 - 4.0  K/uL   Monocytes Relative 6  3 - 12 %   Monocytes Absolute 0.6  0.1 - 1.0 K/uL   Eosinophils Relative 3  0 - 5 %   Eosinophils Absolute 0.3  0.0 - 0.7 K/uL   Basophils Relative 1  0 - 1 %   Basophils Absolute 0.1  0.0 - 0.1 K/uL  BASIC METABOLIC PANEL      Component Value Range   Sodium 138  135 - 145 mEq/L   Potassium 2.7 (*) 3.5 - 5.1 mEq/L   Chloride 100  96 - 112 mEq/L   CO2 29  19 - 32 mEq/L   Glucose, Bld 141 (*) 70 - 99 mg/dL   BUN 13  6 - 23 mg/dL   Creatinine, Ser 1.61  0.50 - 1.35 mg/dL   Calcium 8.9  8.4 - 09.6 mg/dL   GFR calc non Af Amer >90  >90 mL/min   GFR calc Af Amer >90  >90 mL/min  URINALYSIS, ROUTINE W REFLEX MICROSCOPIC      Component Value Range   Color, Urine YELLOW  YELLOW   APPearance CLEAR  CLEAR   Specific Gravity, Urine 1.015  1.005 - 1.030   pH 7.5  5.0 - 8.0   Glucose, UA NEGATIVE  NEGATIVE mg/dL   Hgb urine dipstick NEGATIVE  NEGATIVE   Bilirubin Urine NEGATIVE  NEGATIVE   Ketones, ur NEGATIVE  NEGATIVE mg/dL   Protein, ur NEGATIVE  NEGATIVE mg/dL   Urobilinogen, UA 0.2  0.0 - 1.0 mg/dL   Nitrite NEGATIVE  NEGATIVE   Leukocytes, UA NEGATIVE  NEGATIVE  URINE RAPID DRUG SCREEN (HOSP PERFORMED)      Component Value Range   Opiates POSITIVE (*) NONE DETECTED   Cocaine POSITIVE (*) NONE DETECTED   Benzodiazepines NONE DETECTED  NONE DETECTED   Amphetamines NONE DETECTED  NONE DETECTED   Tetrahydrocannabinol POSITIVE (*) NONE DETECTED   Barbiturates NONE DETECTED  NONE DETECTED  ETHANOL      Component Value Range   Alcohol, Ethyl (B) <11  0 - 11 mg/dL    Ct Head Wo Contrast  02/10/2012  *RADIOLOGY REPORT*  Clinical Data:  Multiple falls, hit forehead  CT HEAD WITHOUT CONTRAST CT CERVICAL SPINE WITHOUT CONTRAST  Technique:  Multidetector CT imaging  of the head and cervical spine was performed following the standard protocol without intravenous contrast.  Multiplanar CT image reconstructions of the cervical spine were also generated.  Comparison:  CT head dated 10/21/2010.  CT HEAD  Findings: No evidence of parenchymal hemorrhage or extra-axial fluid collection. No mass lesion, mass effect, or midline shift.  No CT evidence of acute infarction.  Cerebral volume is age appropriate.  No ventriculomegaly.  The visualized paranasal sinuses are essentially clear. The mastoid air cells are unopacified.  No evidence of calvarial fracture.  IMPRESSION: Normal head CT.  CT CERVICAL SPINE  Findings: Normal cervical lordosis.  No evidence of fracture or dislocation.  Vertebral body heights and intervertebral disc spaces are maintained.  The dens appears intact.  No prevertebral soft tissue swelling.  Mild degenerate changes at C5-6.  Visualized thyroid is unremarkable.  Visualized lung apices are clear.  IMPRESSION: No evidence of traumatic injury to the cervical spine.   Original Report Authenticated By: Charline Bills, M.D.    Ct Cervical Spine Wo Contrast  02/10/2012  *RADIOLOGY REPORT*  Clinical Data:  Multiple falls, hit forehead  CT HEAD WITHOUT CONTRAST CT CERVICAL SPINE WITHOUT CONTRAST  Technique:  Multidetector CT  imaging of the head and cervical spine was performed following the standard protocol without intravenous contrast.  Multiplanar CT image reconstructions of the cervical spine were also generated.  Comparison:  CT head dated 10/21/2010.  CT HEAD  Findings: No evidence of parenchymal hemorrhage or extra-axial fluid collection. No mass lesion, mass effect, or midline shift.  No CT evidence of acute infarction.  Cerebral volume is age appropriate.  No ventriculomegaly.  The visualized paranasal sinuses are essentially clear. The mastoid air cells are unopacified.  No evidence of calvarial fracture.  IMPRESSION: Normal head CT.  CT CERVICAL SPINE   Findings: Normal cervical lordosis.  No evidence of fracture or dislocation.  Vertebral body heights and intervertebral disc spaces are maintained.  The dens appears intact.  No prevertebral soft tissue swelling.  Mild degenerate changes at C5-6.  Visualized thyroid is unremarkable.  Visualized lung apices are clear.  IMPRESSION: No evidence of traumatic injury to the cervical spine.   Original Report Authenticated By: Charline Bills, M.D.     MDM  Patient with polysubstance abuse who has been falling today. Labs with low potassium which was repleted while here. UDS positive for cocaine, opiates and THC.Patient had taken his Remus Loffler prior to arrival. He has been difficult to awaken and keep awake. He is accompanied by his daughter. Both the patient and daughter will remain in the ER until his mother, who works in the hospital gets off her shift at 730 am. She will take both home. Patient was offered polysubstance help. He declined. Pt stable in ED with no significant deterioration in condition.The patient appears reasonably screened and/or stabilized for discharge and I doubt any other medical condition or other [facility] requiring further screening, evaluation, or treatment in the ED at this time prior to discharge.  MDM Reviewed: previous chart, nursing note and vitals Interpretation: labs and CT scan           Nicoletta Dress. Colon Branch, MD 02/10/12 [phone]

## 2012-02-10 NOTE — ED Notes (Signed)
Resting quietly, visible chest rise and fall, no complaints offered.

## 2012-02-10 NOTE — ED Notes (Signed)
Called to patient room by daughter, pt sitting in the floor, states that he had fell and injured his back.  No obvious injury noted, no redness, no edema, no bruising.  Pt assisted from sitting position back to standing with no difficulty and assisted back to bed.  Re-oriented on call bell use and not to get up alone.

## 2012-02-21 ENCOUNTER — Other Ambulatory Visit: Payer: Self-pay

## 2012-02-22 NOTE — Telephone Encounter (Signed)
Pt has an appointment on Tuesday with [facility] at 10:30

## 2012-02-26 ENCOUNTER — Encounter: Payer: Self-pay | Admitting: Gastroenterology

## 2012-02-26 ENCOUNTER — Ambulatory Visit (INDEPENDENT_AMBULATORY_CARE_PROVIDER_SITE_OTHER): Payer: Medicare Other | Admitting: Gastroenterology

## 2012-02-26 VITALS — BP 120/80 | HR 75 | Temp 98.4°F | Ht 72.0 in | Wt 185.2 lb

## 2012-02-26 DIAGNOSIS — K920 Hematemesis: Secondary | ICD-10-CM

## 2012-02-26 DIAGNOSIS — R1013 Epigastric pain: Secondary | ICD-10-CM

## 2012-02-26 DIAGNOSIS — F1910 Other psychoactive substance abuse, uncomplicated: Secondary | ICD-10-CM | POA: Insufficient documentation

## 2012-02-26 DIAGNOSIS — K279 Peptic ulcer, site unspecified, unspecified as acute or chronic, without hemorrhage or perforation: Secondary | ICD-10-CM
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
## 2012-02-26 NOTE — Progress Notes (Signed)
Faxed to PCP

## 2012-02-26 NOTE — Assessment & Plan Note (Signed)
Ongoing intermittent epigastric pain in the setting of NSAID use, history of peptic ulcer disease as outlined above. He never had followup EGD to document healing of ulcers. Given that he is having ongoing pain and reports hematemesis, would offer EGD at this time. Previously done with deep sedation due to polysubstance abuse and polypharmacy. Will approach the same way. Advised patient to continue Prilosec twice a day. Advised to avoid alcohol and illicit drug use. Limit NSAID use. Further recommendations to follow.  I have discussed the risks, alternatives, benefits with regards to but not limited to the risk of reaction to medication, bleeding, infection, perforation and the patient is agreeable to proceed. Written consent to be obtained.  The patient requested Percocet to be given today. I declined given his history of polysubstance abuse and previous drug seeking behavior.

## 2012-02-26 NOTE — Patient Instructions (Addendum)
We have scheduled you for an upper endoscopy with Dr. Jena Gauss to further evaluate your abdominal pain and to see if your ulcers have healed. You need to stop using illicit drugs. If you are interested in referral for assistance, please let us know.

## 2012-02-28 ENCOUNTER — Telehealth: Payer: Self-pay

## 2012-02-28 ENCOUNTER — Other Ambulatory Visit: Payer: Self-pay

## 2012-02-28 NOTE — Progress Notes (Signed)
Date/ Time and instructions mailed for pt's EGD with [facility] in OR on 03/20/2012 @ 7:30 AM. Tried to call pt and VM not set up.

## 2012-02-28 NOTE — Telephone Encounter (Signed)
Pt called and was informed of his appt on 03/20/2012 at 7:30 AM for EGD with Dr. Jena Gauss. He is aware that his pre-opt appt is on 03/13/2012 at 11:00 AM, and they will inform him what time to arrive at the hospital on 03/20/2012. He is aware that instructions with dates/ti,e have been mailed.

## 2012-02-28 NOTE — Progress Notes (Signed)
Opened in error

## 2012-03-05 ENCOUNTER — Other Ambulatory Visit (HOSPITAL_COMMUNITY): Payer: Medicare Other

## 2012-03-13 ENCOUNTER — Encounter (HOSPITAL_COMMUNITY): Admission: RE | Admit: 2012-03-13 | Payer: Medicare Other | Source: Ambulatory Visit

## 2012-03-13 ENCOUNTER — Telehealth: Payer: Self-pay

## 2012-03-13 NOTE — Telephone Encounter (Signed)
T/C from [place] in Endo. Said pt was a no show for his pre-op today. ( He is scheduled for EGD in OR with [facility] on 03/20/2012 ). I tried to call pt, his number is not working. His emergency contact is his mom and I called her work number at [facility] [employer] and she works third shift. Please advise!

## 2012-03-13 NOTE — Telephone Encounter (Signed)
Only number listed has been disconnected. Mother is emergency contact and works 3rd shift at [employer] in ICU.  I called the ICU, she is not scheduled to work until next Tuesday night. They have the same phone number listed as we do.   At this point, I would send him a letter to have him call us and we can also called ICU on Tuesday afternoon and leave msg for Bonnell Public to call us. Patient will likely have to reschedule.

## 2012-03-14 NOTE — Telephone Encounter (Signed)
Letter mailed to pt.  

## 2012-03-18 NOTE — Telephone Encounter (Signed)
Per Tana Coast, PA can just cancel pt. He will not have time to do pre-op. I took him off of the schedule and informed Kim.

## 2012-03-20 ENCOUNTER — Encounter (HOSPITAL_COMMUNITY): Admission: RE | Payer: Self-pay | Source: Ambulatory Visit

## 2012-03-20 ENCOUNTER — Ambulatory Visit (HOSPITAL_COMMUNITY): Admission: RE | Admit: 2012-03-20 | Payer: Medicare Other | Source: Ambulatory Visit | Admitting: Internal Medicine

## 2012-03-20 SURGERY — EGD (ESOPHAGOGASTRODUODENOSCOPY)
Anesthesia: Monitor Anesthesia Care

## 2012-07-05 IMAGING — CT CT ABD-PELV W/ CM
2 of 4 series · 16 of 46 positions shown, 18 images · IV contrast (Omnipaque 300)
Comparison: 12/24/2010

CLINICAL DATA: Sudden onset of abdominal pain today.  Small not
appear above the emboli this.  History of umbilical hernia repair
and peptic ulcer disease.

CT ABDOMEN AND PELVIS WITH CONTRAST
TECHNIQUE: Multidetector CT imaging of the abdomen and pelvis was
performed following the standard protocol during bolus
administration of intravenous contrast.
Contrast: 40mL OMNIPAQUE IOHEXOL 300 MG/ML IV SOLN, 100mL OMNIPAQUE
IOHEXOL 300 MG/ML IV SOLN the

[Series 3: abd_pel_with 5.0 b40f · axial · 0.70mm/px · z∈[-559,-159]mm · 13 of 88 slices shown, 15 images]
[im 4/88  soft-tissue]
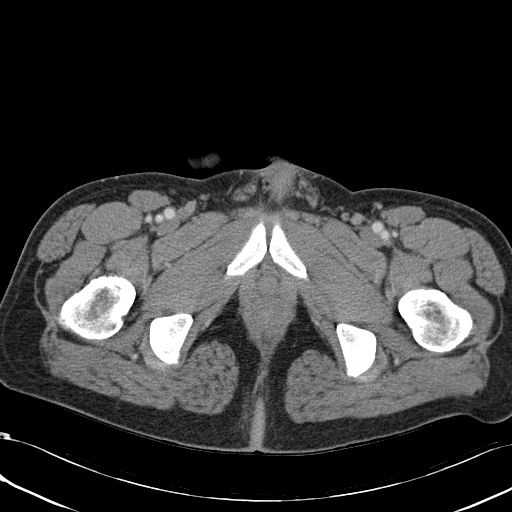
[im 4/88  bone]
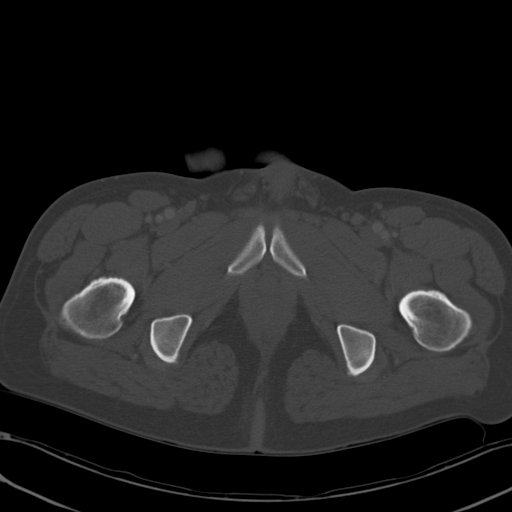
[im 11/88  soft-tissue]
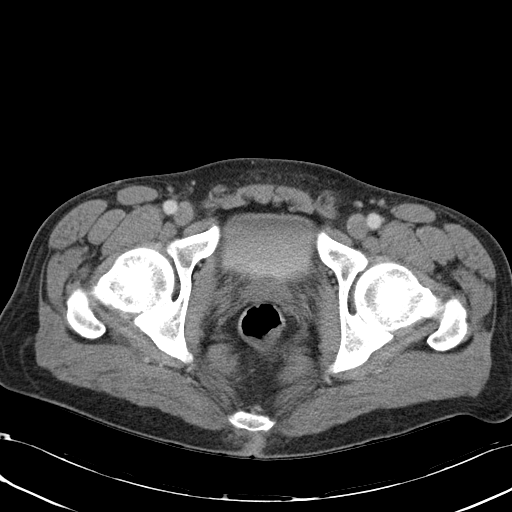
[im 17/88  soft-tissue]
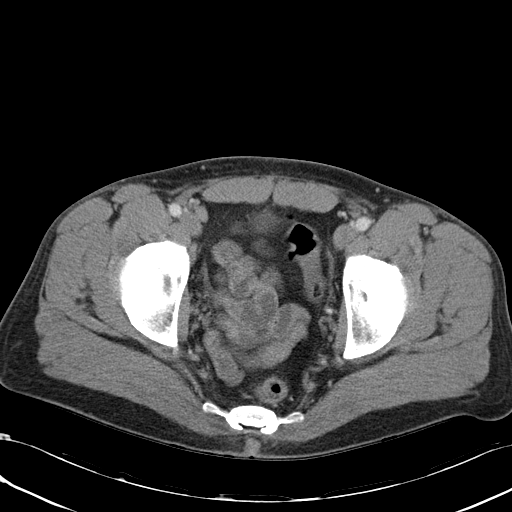
[im 24/88  soft-tissue]
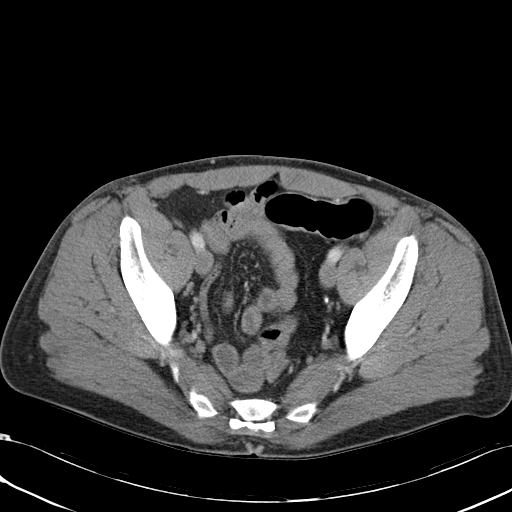
[im 31/88  soft-tissue]
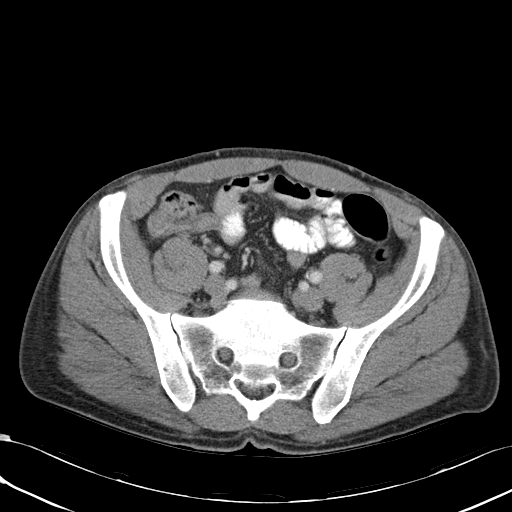
[im 37/88  soft-tissue]
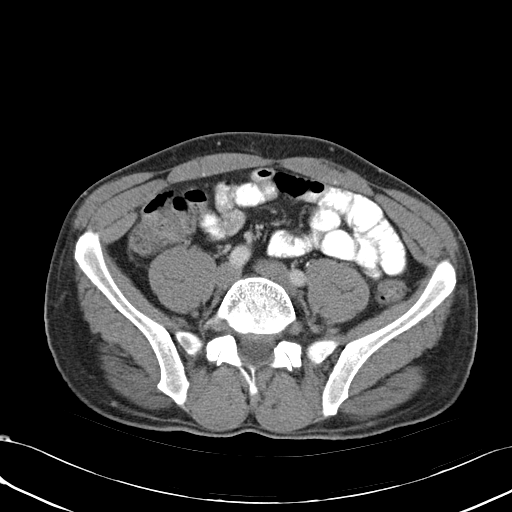
[im 44/88  soft-tissue]
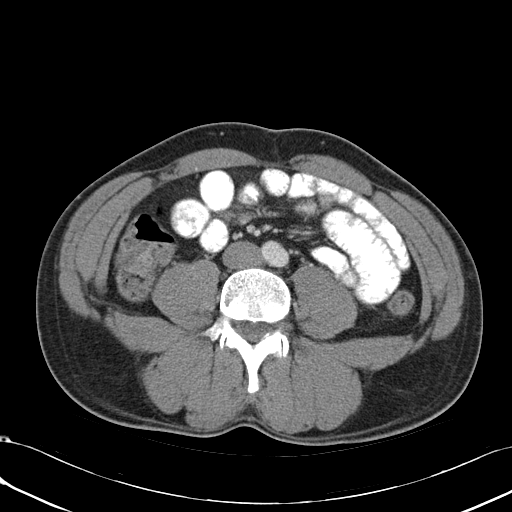
[im 51/88  soft-tissue]
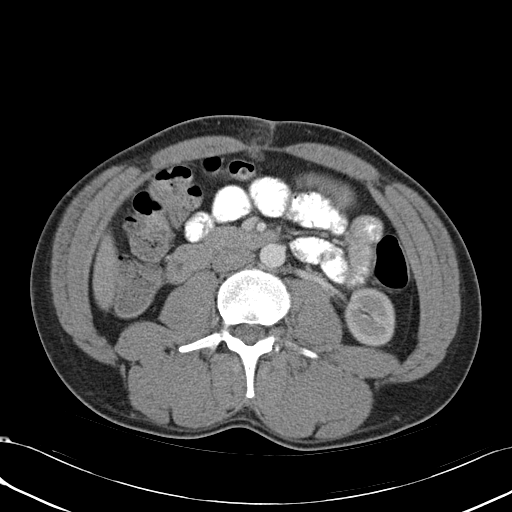
[im 57/88  soft-tissue]
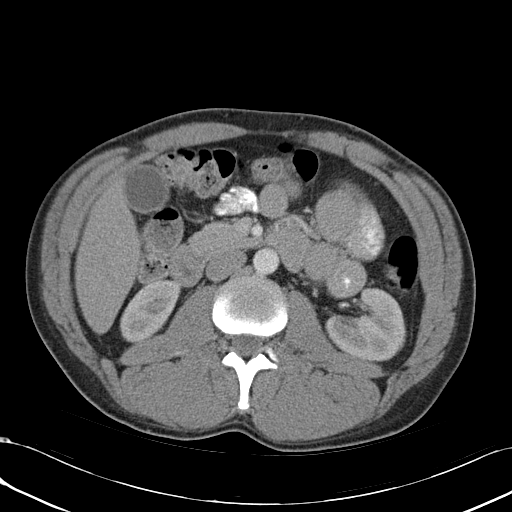
[im 57/88  bone]
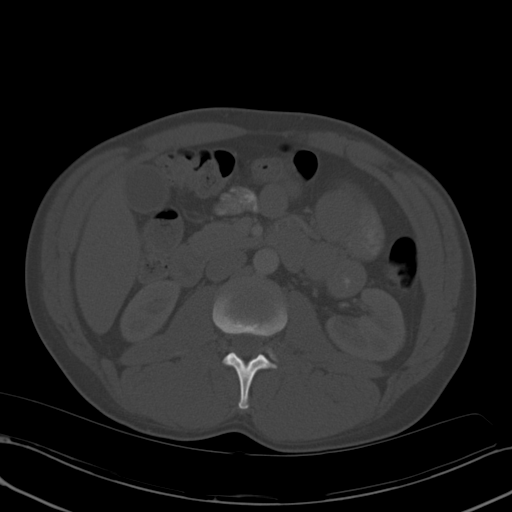
[im 64/88  soft-tissue]
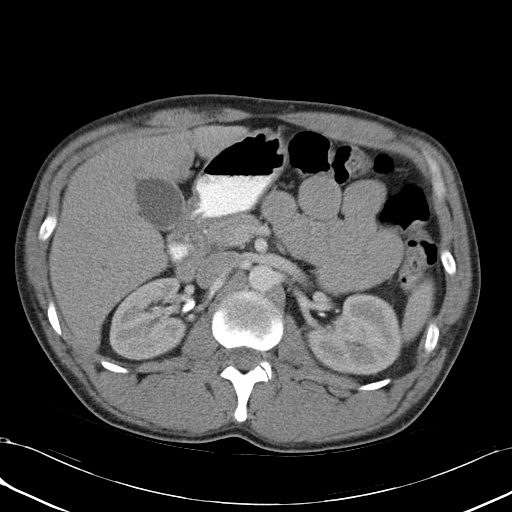
[im 71/88  soft-tissue]
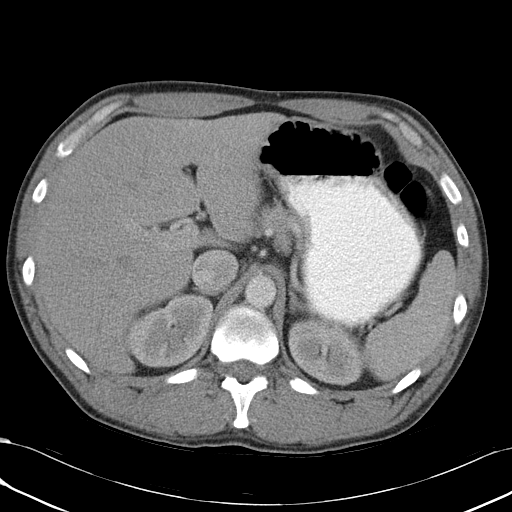
[im 77/88  soft-tissue]
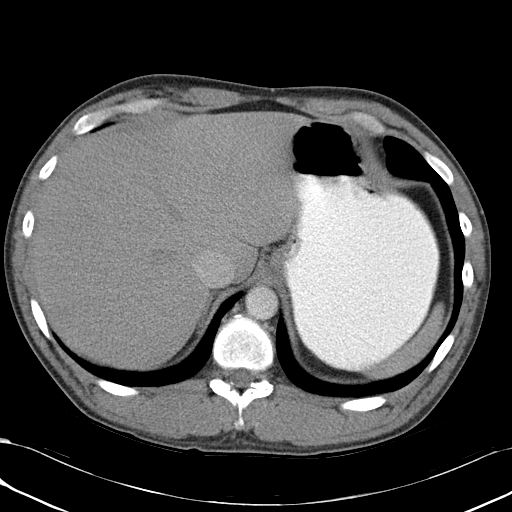
[im 84/88  soft-tissue]
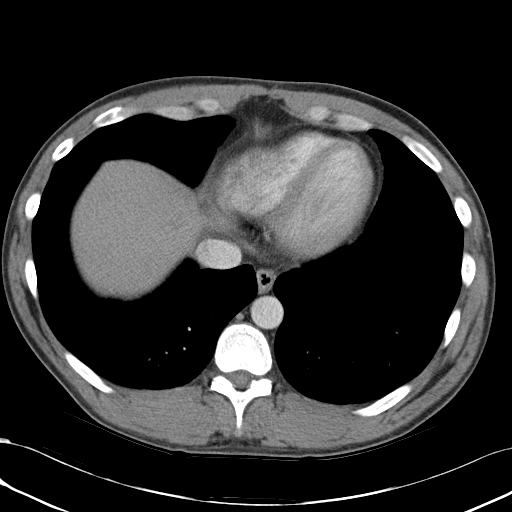

[Series 5: abd_pel_with 3.0 spo · coronal · 0.62mm/px · 3 of 70 slices shown]
[im 24/70  soft-tissue]
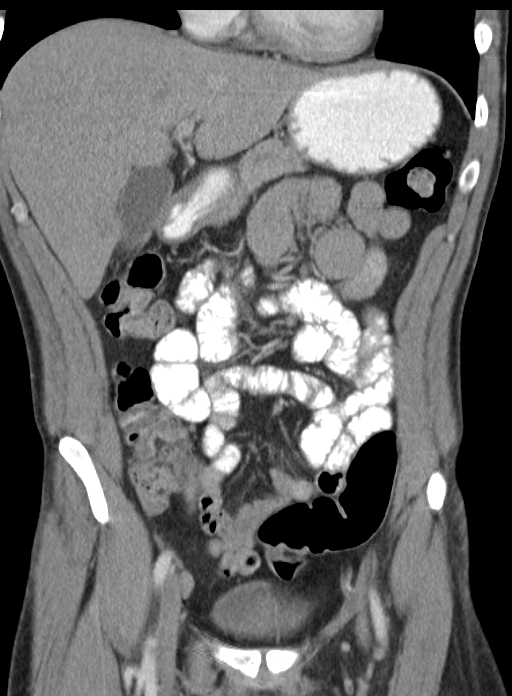
[im 31/70  soft-tissue]
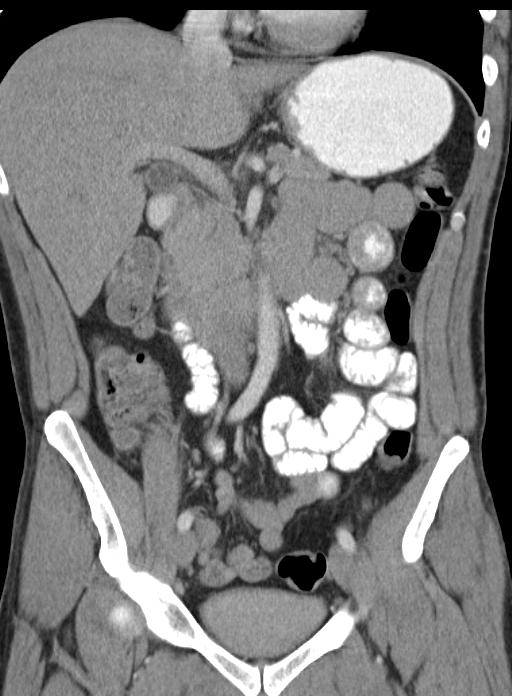
[im 39/70  soft-tissue]
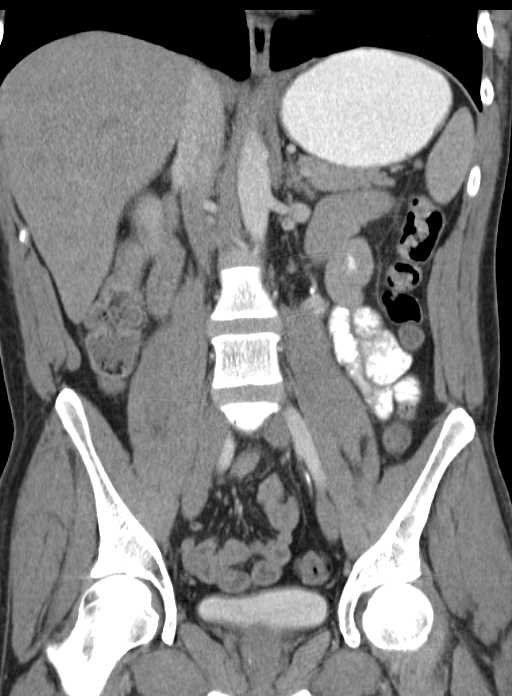

[16 of 46 positions shown; findings below may reference images not displayed]

FINDINGS: Mild dependent atelectasis in the lung bases.  The liver,
spleen, gallbladder, pancreas, adrenal glands, kidneys, abdominal
aorta, and retroperitoneal lymph nodes are unremarkable. There is a
small accessory spleen.  The stomach and small bowel are not
distended.  No apparent wall thickening.  Stool filled colon
without distension or wall thickening.  No free air or free fluid
in the abdomen.  There is mild scarring and deformity in the
umbilical region consistent with postoperative change.  Since the
previous study, there is slightly increased infiltration around the
postoperative site which suggests a small focal recurrent hernia
with possible fat necrosis.  No bowel herniation.

Pelvis:  Calcification in the prostate gland.  Bladder wall is not
thickened.  Diverticula in the sigmoid colon without inflammatory
change.  No free or loculated pelvic fluid collections.  The
appendix is normal.  Normal alignment of the lumbar vertebra.
IMPRESSION: Postoperative changes at the umbilical region with interval
development of a small recurrent hernia containing fat with
stranding suggesting fat necrosis.

## 2012-10-06 ENCOUNTER — Emergency Department (HOSPITAL_COMMUNITY): Payer: Medicare Other

## 2012-10-06 ENCOUNTER — Encounter (HOSPITAL_COMMUNITY): Payer: Self-pay | Admitting: Emergency Medicine

## 2012-10-06 ENCOUNTER — Emergency Department (HOSPITAL_COMMUNITY)
Admission: EM | Admit: 2012-10-06 | Discharge: 2012-10-06 | Disposition: A | Discharge status: Home / Self Care | Payer: Medicare Other | Attending: Emergency Medicine | Admitting: Emergency Medicine

## 2012-10-06 DIAGNOSIS — Z91040 Latex allergy status: Secondary | ICD-10-CM | POA: Insufficient documentation

## 2012-10-06 DIAGNOSIS — Z8711 Personal history of peptic ulcer disease: Secondary | ICD-10-CM | POA: Insufficient documentation

## 2012-10-06 DIAGNOSIS — G40909 Epilepsy, unspecified, not intractable, without status epilepticus: Secondary | ICD-10-CM | POA: Insufficient documentation

## 2012-10-06 DIAGNOSIS — S91009A Unspecified open wound, unspecified ankle, initial encounter: Secondary | ICD-10-CM | POA: Insufficient documentation

## 2012-10-06 DIAGNOSIS — S91012A Laceration without foreign body, left ankle, initial encounter: Secondary | ICD-10-CM

## 2012-10-06 DIAGNOSIS — W108XXA Fall (on) (from) other stairs and steps, initial encounter: Secondary | ICD-10-CM | POA: Insufficient documentation

## 2012-10-06 DIAGNOSIS — F4310 Post-traumatic stress disorder, unspecified: Secondary | ICD-10-CM | POA: Insufficient documentation

## 2012-10-06 DIAGNOSIS — W268XXA Contact with other sharp object(s), not elsewhere classified, initial encounter: Secondary | ICD-10-CM | POA: Insufficient documentation

## 2012-10-06 DIAGNOSIS — F319 Bipolar disorder, unspecified: Secondary | ICD-10-CM | POA: Insufficient documentation

## 2012-10-06 DIAGNOSIS — Z79899 Other long term (current) drug therapy: Secondary | ICD-10-CM | POA: Insufficient documentation

## 2012-10-06 DIAGNOSIS — S81009A Unspecified open wound, unspecified knee, initial encounter: Secondary | ICD-10-CM | POA: Insufficient documentation

## 2012-10-06 DIAGNOSIS — K219 Gastro-esophageal reflux disease without esophagitis: Secondary | ICD-10-CM | POA: Insufficient documentation

## 2012-10-06 DIAGNOSIS — Z8659 Personal history of other mental and behavioral disorders: Secondary | ICD-10-CM | POA: Insufficient documentation

## 2012-10-06 DIAGNOSIS — Z9889 Other specified postprocedural states: Secondary | ICD-10-CM | POA: Insufficient documentation

## 2012-10-06 DIAGNOSIS — Y929 Unspecified place or not applicable: Secondary | ICD-10-CM | POA: Insufficient documentation

## 2012-10-06 DIAGNOSIS — S93409A Sprain of unspecified ligament of unspecified ankle, initial encounter: Secondary | ICD-10-CM | POA: Insufficient documentation

## 2012-10-06 DIAGNOSIS — M19079 Primary osteoarthritis, unspecified ankle and foot: Secondary | ICD-10-CM | POA: Insufficient documentation

## 2012-10-06 DIAGNOSIS — S93402A Sprain of unspecified ligament of left ankle, initial encounter: Secondary | ICD-10-CM

## 2012-10-06 DIAGNOSIS — IMO0002 Reserved for concepts with insufficient information to code with codable children: Secondary | ICD-10-CM | POA: Insufficient documentation

## 2012-10-06 DIAGNOSIS — F411 Generalized anxiety disorder: Secondary | ICD-10-CM | POA: Insufficient documentation

## 2012-10-06 DIAGNOSIS — F17200 Nicotine dependence, unspecified, uncomplicated: Secondary | ICD-10-CM | POA: Insufficient documentation

## 2012-10-06 DIAGNOSIS — Y9301 Activity, walking, marching and hiking: Secondary | ICD-10-CM | POA: Insufficient documentation

## 2012-10-06 HISTORY — DX: Post-traumatic stress disorder, unspecified: F43.10

## 2012-10-06 MED ORDER — LIDOCAINE HCL (PF) 1 % IJ SOLN
INTRAMUSCULAR | Status: AC
  Administered 2012-10-06: 5 mL
  Filled 2012-10-06: qty 5

## 2012-10-06 MED ORDER — ACETAMINOPHEN 325 MG PO TABS
650.0000 mg | ORAL_TABLET | Freq: Once | ORAL | Status: AC
  Administered 2012-10-06: 650 mg via ORAL
  Filled 2012-10-06: qty 2
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
## 2012-10-06 NOTE — ED Notes (Signed)
Given crackers and soda at patient request.

## 2012-10-06 NOTE — ED Notes (Signed)
Twisted ankle, fell and struck leg on a nail causing the laceration

## 2012-10-07 ENCOUNTER — Emergency Department: Payer: Self-pay | Admitting: Emergency Medicine

## 2012-10-07 LAB — DRUG SCREEN, URINE
Amphetamines, Ur Screen: POSITIVE (ref ?–1000)
Barbiturates, Ur Screen: NEGATIVE (ref ?–200)
Benzodiazepine, Ur Scrn: NEGATIVE (ref ?–200)
Cocaine Metabolite,Ur ~~LOC~~: POSITIVE (ref ?–300)
MDMA (Ecstasy)Ur Screen: POSITIVE (ref ?–500)
Opiate, Ur Screen: NEGATIVE (ref ?–300)
Phencyclidine (PCP) Ur S: NEGATIVE (ref ?–25)
Tricyclic, Ur Screen: NEGATIVE (ref ?–1000)

## 2012-10-07 LAB — URINALYSIS, COMPLETE
Bacteria: NONE SEEN
Bilirubin,UR: NEGATIVE
Blood: NEGATIVE
Hyaline Cast: 3
Ketone: NEGATIVE
Leukocyte Esterase: NEGATIVE
Ph: 6 (ref 4.5–8.0)
Protein: NEGATIVE
RBC,UR: NONE SEEN /HPF (ref 0–5)
Specific Gravity: 1.008 (ref 1.003–1.030)

## 2012-10-07 LAB — TSH: Thyroid Stimulating Horm: 0.41 u[IU]/mL — ABNORMAL LOW

## 2012-10-07 LAB — COMPREHENSIVE METABOLIC PANEL
Albumin: 3.7 g/dL (ref 3.4–5.0)
Anion Gap: 6 — ABNORMAL LOW (ref 7–16)
Bilirubin,Total: 0.3 mg/dL (ref 0.2–1.0)
Calcium, Total: 8.6 mg/dL (ref 8.5–10.1)
Co2: 27 mmol/L (ref 21–32)
Creatinine: 1.19 mg/dL (ref 0.60–1.30)
EGFR (African American): >60
EGFR (Non-African Amer.): >60
Osmolality: 273 (ref 275–301)
Potassium: 3.7 mmol/L (ref 3.5–5.1)
SGPT (ALT): 26 U/L (ref 12–78)
Sodium: 138 mmol/L (ref 136–145)
Total Protein: 6.7 g/dL (ref 6.4–8.2)

## 2012-10-07 LAB — ETHANOL
Ethanol %: <0.003 % (ref 0.000–0.080)
Ethanol: <3 mg/dL

## 2012-10-07 LAB — CBC
HGB: 15.1 g/dL (ref 13.0–18.0)
RBC: 4.68 10*6/uL (ref 4.40–5.90)
WBC: 10.8 10*3/uL — ABNORMAL HIGH (ref 3.8–10.6)

## 2012-10-08 LAB — ACETAMINOPHEN LEVEL: Acetaminophen: <2 ug/mL

## 2012-10-08 LAB — SALICYLATE LEVEL: Salicylates, Serum: 6.8 mg/dL — ABNORMAL HIGH

## 2012-11-28 DIAGNOSIS — F445 Conversion disorder with seizures or convulsions: Secondary | ICD-10-CM

## 2012-11-28 DIAGNOSIS — R569 Unspecified convulsions: Secondary | ICD-10-CM

## 2012-11-28 HISTORY — DX: Unspecified convulsions: R56.9

## 2012-11-28 HISTORY — DX: Conversion disorder with seizures or convulsions: F44.5

## 2013-02-04 ENCOUNTER — Emergency Department (HOSPITAL_COMMUNITY): Payer: Medicare Other

## 2013-02-04 ENCOUNTER — Emergency Department (HOSPITAL_COMMUNITY)
Admission: EM | Admit: 2013-02-04 | Discharge: 2013-02-05 | Disposition: A | Discharge status: Home / Self Care | Payer: Medicare Other | Attending: Emergency Medicine | Admitting: Emergency Medicine

## 2013-02-04 ENCOUNTER — Encounter (HOSPITAL_COMMUNITY): Payer: Self-pay | Admitting: Emergency Medicine

## 2013-02-04 DIAGNOSIS — Z8719 Personal history of other diseases of the digestive system: Secondary | ICD-10-CM | POA: Insufficient documentation

## 2013-02-04 DIAGNOSIS — R569 Unspecified convulsions: Secondary | ICD-10-CM | POA: Insufficient documentation

## 2013-02-04 DIAGNOSIS — F1410 Cocaine abuse, uncomplicated: Secondary | ICD-10-CM

## 2013-02-04 DIAGNOSIS — Z8711 Personal history of peptic ulcer disease: Secondary | ICD-10-CM | POA: Insufficient documentation

## 2013-02-04 DIAGNOSIS — F17200 Nicotine dependence, unspecified, uncomplicated: Secondary | ICD-10-CM | POA: Insufficient documentation

## 2013-02-04 DIAGNOSIS — F319 Bipolar disorder, unspecified: Secondary | ICD-10-CM | POA: Insufficient documentation

## 2013-02-04 DIAGNOSIS — F909 Attention-deficit hyperactivity disorder, unspecified type: Secondary | ICD-10-CM | POA: Insufficient documentation

## 2013-02-04 DIAGNOSIS — Z91040 Latex allergy status: Secondary | ICD-10-CM | POA: Insufficient documentation

## 2013-02-04 DIAGNOSIS — F445 Conversion disorder with seizures or convulsions: Secondary | ICD-10-CM

## 2013-02-04 DIAGNOSIS — Z8739 Personal history of other diseases of the musculoskeletal system and connective tissue: Secondary | ICD-10-CM | POA: Insufficient documentation

## 2013-02-04 DIAGNOSIS — F1420 Cocaine dependence, uncomplicated: Secondary | ICD-10-CM | POA: Insufficient documentation

## 2013-02-04 DIAGNOSIS — F411 Generalized anxiety disorder: Secondary | ICD-10-CM | POA: Insufficient documentation

## 2013-02-04 HISTORY — DX: Other psychoactive substance abuse, uncomplicated: F19.10

## 2013-02-04 HISTORY — DX: Conversion disorder with seizures or convulsions: F44.5

## 2013-02-04 LAB — CBC WITH DIFFERENTIAL/PLATELET
Basophils Absolute: 0.1 10*3/uL (ref 0.0–0.1)
Basophils Relative: 1 % (ref 0–1)
Eosinophils Relative: 9 % — ABNORMAL HIGH (ref 0–5)
HCT: 36.5 % — ABNORMAL LOW (ref 39.0–52.0)
MCHC: 36.4 g/dL — ABNORMAL HIGH (ref 30.0–36.0)
MCV: 89 fL (ref 78.0–100.0)
Monocytes Absolute: 0.7 10*3/uL (ref 0.1–1.0)
Platelets: 260 10*3/uL (ref 150–400)
RDW: 12.5 % (ref 11.5–15.5)

## 2013-02-04 LAB — COMPREHENSIVE METABOLIC PANEL
ALT: 16 U/L (ref 0–53)
AST: 13 U/L (ref 0–37)
Alkaline Phosphatase: 51 U/L (ref 39–117)
CO2: 31 mEq/L (ref 19–32)
Chloride: 96 mEq/L (ref 96–112)
Creatinine, Ser: 0.91 mg/dL (ref 0.50–1.35)
GFR calc Af Amer: >90 mL/min (ref >90)
GFR calc non Af Amer: >90 mL/min (ref >90)
Glucose, Bld: 112 mg/dL — ABNORMAL HIGH (ref 70–99)
Potassium: 3.9 mEq/L (ref 3.5–5.1)
Sodium: 135 mEq/L (ref 135–145)
Total Bilirubin: 0.2 mg/dL — ABNORMAL LOW (ref 0.3–1.2)

## 2013-02-04 LAB — ETHANOL: Alcohol, Ethyl (B): <11 mg/dL (ref 0–11)

## 2013-02-04 LAB — RAPID URINE DRUG SCREEN, HOSP PERFORMED
Barbiturates: NOT DETECTED
Benzodiazepines: NOT DETECTED
Cocaine: POSITIVE — AB
Opiates: NOT DETECTED

## 2013-02-04 MED ORDER — LORAZEPAM 1 MG PO TABS
1.0000 mg | ORAL_TABLET | Freq: Once | ORAL | Status: AC
  Administered 2013-02-04: 1 mg via ORAL
  Filled 2013-02-04: qty 1

## 2013-02-04 NOTE — Progress Notes (Signed)
Pt removed most of his earrings and necklace and put in a plastic bag supplied by radiology/ bag was then placed into patients LT front pants pocket by the patient/  Witnessed by Pollyann Samples, RT and Ladene Artist, Rt

## 2013-02-04 NOTE — ED Notes (Signed)
Observed pt from nursing station shaking in the bed. EDP in room with pt.

## 2013-02-04 NOTE — ED Notes (Signed)
[facility] dx pseudoseizures 2 months ago. Left AMA, no followup. Per mother, "He's had 4-5 'seizures' today. His body moves like he's riding a bicycle." Denies incontinence.

## 2013-02-04 NOTE — ED Notes (Signed)
Pt shaking in chair able to respond to verbal stimuli appropriately. Per pt's daughter, "he's about to have another seizure." A&O x 4

## 2013-02-04 NOTE — ED Notes (Signed)
Called to pt room stated he was having another seizure. Shaking noted for about 20 seconds, to stopped & opened eyes, then started shaking again. EDP notified.
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
## 2013-02-05 NOTE — ED Provider Notes (Signed)
12:18 AM Pt feels much better. Presentation consistent with nonepileptic seizures. Doubt benzo withdrawal. Doubt ETOH withdrawal. Pt told to stop using cocaine. Will follow up with pcp, neurology and his psychiatrist as an outpatient  Lyanne Co, MD 02/05/13 [phone]

## 2013-04-07 ENCOUNTER — Encounter (HOSPITAL_COMMUNITY): Payer: Self-pay | Admitting: Emergency Medicine

## 2013-04-07 ENCOUNTER — Emergency Department (HOSPITAL_COMMUNITY)
Admission: EM | Admit: 2013-04-07 | Discharge: 2013-04-07 | Disposition: A | Discharge status: Home / Self Care | Payer: Medicare Other | Attending: Emergency Medicine | Admitting: Emergency Medicine

## 2013-04-07 ENCOUNTER — Emergency Department (HOSPITAL_COMMUNITY): Payer: Medicare Other

## 2013-04-07 DIAGNOSIS — IMO0002 Reserved for concepts with insufficient information to code with codable children: Secondary | ICD-10-CM | POA: Diagnosis not present

## 2013-04-07 DIAGNOSIS — Y9210 Unspecified residential institution as the place of occurrence of the external cause: Secondary | ICD-10-CM | POA: Insufficient documentation

## 2013-04-07 DIAGNOSIS — F329 Major depressive disorder, single episode, unspecified: Secondary | ICD-10-CM | POA: Insufficient documentation

## 2013-04-07 DIAGNOSIS — G40909 Epilepsy, unspecified, not intractable, without status epilepticus: Secondary | ICD-10-CM | POA: Insufficient documentation

## 2013-04-07 DIAGNOSIS — W1809XA Striking against other object with subsequent fall, initial encounter: Secondary | ICD-10-CM | POA: Insufficient documentation

## 2013-04-07 DIAGNOSIS — F4310 Post-traumatic stress disorder, unspecified: Secondary | ICD-10-CM | POA: Insufficient documentation

## 2013-04-07 DIAGNOSIS — F17200 Nicotine dependence, unspecified, uncomplicated: Secondary | ICD-10-CM | POA: Insufficient documentation

## 2013-04-07 DIAGNOSIS — F3289 Other specified depressive episodes: Secondary | ICD-10-CM | POA: Insufficient documentation

## 2013-04-07 DIAGNOSIS — F411 Generalized anxiety disorder: Secondary | ICD-10-CM | POA: Insufficient documentation

## 2013-04-07 DIAGNOSIS — Z8719 Personal history of other diseases of the digestive system: Secondary | ICD-10-CM | POA: Insufficient documentation

## 2013-04-07 DIAGNOSIS — Z79899 Other long term (current) drug therapy: Secondary | ICD-10-CM | POA: Insufficient documentation

## 2013-04-07 DIAGNOSIS — Y939 Activity, unspecified: Secondary | ICD-10-CM | POA: Insufficient documentation

## 2013-04-07 DIAGNOSIS — S0990XA Unspecified injury of head, initial encounter: Secondary | ICD-10-CM

## 2013-04-07 DIAGNOSIS — Z8739 Personal history of other diseases of the musculoskeletal system and connective tissue: Secondary | ICD-10-CM | POA: Insufficient documentation

## 2013-04-07 DIAGNOSIS — Z8711 Personal history of peptic ulcer disease: Secondary | ICD-10-CM | POA: Insufficient documentation

## 2013-04-07 DIAGNOSIS — S0081XA Abrasion of other part of head, initial encounter: Secondary | ICD-10-CM

## 2013-04-07 DIAGNOSIS — F445 Conversion disorder with seizures or convulsions: Secondary | ICD-10-CM

## 2013-04-07 DIAGNOSIS — Z91040 Latex allergy status: Secondary | ICD-10-CM | POA: Insufficient documentation

## 2013-04-07 LAB — CBC WITH DIFFERENTIAL/PLATELET
Basophils Relative: 1 % (ref 0–1)
Eosinophils Absolute: 0.3 10*3/uL (ref 0.0–0.7)
HCT: 41.9 % (ref 39.0–52.0)
Hemoglobin: 14.7 g/dL (ref 13.0–17.0)
Lymphocytes Relative: 42 % (ref 12–46)
MCH: 31.9 pg (ref 26.0–34.0)
MCHC: 35.1 g/dL (ref 30.0–36.0)
Monocytes Relative: 9 % (ref 3–12)
Neutro Abs: 3.4 10*3/uL (ref 1.7–7.7)
Neutrophils Relative %: 44 % (ref 43–77)
RBC: 4.61 MIL/uL (ref 4.22–5.81)
WBC: 7.6 10*3/uL (ref 4.0–10.5)

## 2013-04-07 LAB — RAPID URINE DRUG SCREEN, HOSP PERFORMED
Amphetamines: NOT DETECTED
Barbiturates: NOT DETECTED
Benzodiazepines: POSITIVE — AB
Cocaine: POSITIVE — AB
Opiates: NOT DETECTED

## 2013-04-07 LAB — POCT I-STAT, CHEM 8
BUN: 16 mg/dL (ref 6–23)
Calcium, Ion: 1.14 mmol/L (ref 1.12–1.23)
Chloride: 104 mEq/L (ref 96–112)
Creatinine, Ser: 0.9 mg/dL (ref 0.50–1.35)
Glucose, Bld: 73 mg/dL (ref 70–99)
Hemoglobin: 15 g/dL (ref 13.0–17.0)
TCO2: 23 mmol/L (ref 0–100)

## 2013-04-07 LAB — VALPROIC ACID LEVEL: Valproic Acid Lvl: 15.8 ug/mL — ABNORMAL LOW (ref 50.0–100.0)

## 2013-04-07 MED ORDER — AMMONIA AROMATIC IN INHA: 0.3000 mL | RESPIRATORY_TRACT | Status: DC | PRN

## 2013-04-07 MED ORDER — AMMONIA AROMATIC IN INHA
RESPIRATORY_TRACT | Status: AC
  Filled 2013-04-07: qty 10
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
## 2013-04-07 NOTE — ED Notes (Signed)
Pt went for procedure 

## 2013-04-07 NOTE — ED Notes (Signed)
Patient noted to have seizure during triage. Full body tonic/clonic noted, lasting 10 seconds. Patient conscious and alert within seconds of cessation of activity. MD at bedside

## 2013-04-07 NOTE — ED Notes (Signed)
Patient arrives via EMS from [place] [facility] with c/o seizures. Patient states seizures started this past year. Denies any trauma. Patient with abrasion to left cheek and periorbital area that he states he did two days ago during a seizure. Patient alert/oriented x 4. C/o jerking to body muscles.

## 2013-04-07 NOTE — ED Notes (Signed)
Pt resting.  No distress noted.  Guard at bedside.

## 2013-04-07 NOTE — Discharge Instructions (Signed)
Keep the abrasion for a clean with soap and water once or twice a day. Take Tylenol or Motrin, for pain. Return here, if needed, for problems.   Abrasion An abrasion is a cut or scrape of the skin. Abrasions do not extend through all layers of the skin and most heal within 10 days. It is important to care for your abrasion properly to prevent infection. CAUSES  Most abrasions are caused by falling on, or gliding across, the ground or other surface. When your skin rubs on something, the outer and inner layer of skin rubs off, causing an abrasion. DIAGNOSIS  Your caregiver will be able to diagnose an abrasion during a physical exam.  TREATMENT  Your treatment depends on how large and deep the abrasion is. Generally, your abrasion will be cleaned with water and a mild soap to remove any dirt or debris. An antibiotic ointment may be put over the abrasion to prevent an infection. A bandage (dressing) may be wrapped around the abrasion to keep it from getting dirty.  You may need a tetanus shot if:  You cannot remember when you had your last tetanus shot.  You have never had a tetanus shot.  The injury broke your skin. If you get a tetanus shot, your arm may swell, get red, and feel warm to the touch. This is common and not a problem. If you need a tetanus shot and you choose not to have one, there is a rare chance of getting tetanus. Sickness from tetanus can be serious.  HOME CARE INSTRUCTIONS   If a dressing was applied, change it at least once a day or as directed by your caregiver. If the bandage sticks, soak it off with warm water.   Wash the area with water and a mild soap to remove all the ointment 2 times a day. Rinse off the soap and pat the area dry with a clean towel.   Reapply any ointment as directed by your caregiver. This will help prevent infection and keep the bandage from sticking. Use gauze over the wound and under the dressing to help keep the bandage from sticking.    Change your dressing right away if it becomes wet or dirty.   Only take over-the-counter or prescription medicines for pain, discomfort, or fever as directed by your caregiver.   Follow up with your caregiver within 24 48 hours for a wound check, or as directed. If you were not given a wound-check appointment, look closely at your abrasion for redness, swelling, or pus. These are signs of infection. SEEK IMMEDIATE MEDICAL CARE IF:   You have increasing pain in the wound.   You have redness, swelling, or tenderness around the wound.   You have pus coming from the wound.   You have a fever or persistent symptoms for more than 2 3 days.  You have a fever and your symptoms suddenly get worse.  You have a bad smell coming from the wound or dressing.  MAKE SURE YOU:   Understand these instructions.  Will watch your condition.  Will get help right away if you are not doing well or get worse. Document Released: 01/24/2005 Document Revised: 04/02/2012 Document Reviewed: 03/20/2011 [facility] Patient Information 2014 [place], [place].  Head Injury, Adult You have had a head injury that does not appear serious at this time. A concussion is a state of changed mental ability, usually from a blow to the head. You should take clear liquids for the rest of the  day and then resume your regular diet. You should not take sedatives or alcoholic beverages for as long as directed by your caregiver after discharge. After injuries such as yours, most problems occur within the first 24 hours. SYMPTOMS These minor symptoms may be experienced after discharge:  Memory difficulties.  Dizziness.  Headaches.  Double vision.  Hearing difficulties.  Depression.  Tiredness.  Weakness.  Difficulty with concentration. If you experience any of these problems, you should not be alarmed. A concussion requires a few days for recovery. Many patients with head injuries frequently experience such  symptoms. Usually, these problems disappear without medical care. If symptoms last for more than one day, notify your caregiver. See your caregiver sooner if symptoms are becoming worse rather than better. HOME CARE INSTRUCTIONS   During the next 24 hours you must stay with someone who can watch you for the warning signs listed below. Although it is unlikely that serious side effects will occur, you should be aware of signs and symptoms which may necessitate your return to this location. Side effects may occur up to 7  10 days following the injury. It is important for you to carefully monitor your condition and contact your caregiver or seek immediate medical attention if there is a change in your condition. SEEK IMMEDIATE MEDICAL CARE IF:   There is confusion or drowsiness.  You can not awaken the injured person.  There is nausea (feeling sick to your stomach) or continued, forceful vomiting.  You notice dizziness or unsteadiness which is getting worse, or inability to walk.  You have convulsions or unconsciousness.  You experience severe, persistent headaches not relieved by over-the-counter or prescription medicines for pain. (Do not take aspirin as this impairs clotting abilities). Take other pain medications only as directed.  You can not use arms or legs normally.  There is clear or bloody discharge from the nose or ears. MAKE SURE YOU:   Understand these instructions.  Will watch your condition.  Will get help right away if you are not doing well or get worse. Document Released: 04/16/2005 Document Revised: 07/09/2011 Document Reviewed: 03/04/2009 [facility] Patient Information 2014 [place], [place].

## 2013-04-07 NOTE — ED Notes (Signed)
Patient having witness psuedoseizure. Dr Effie Shy present. Seizure lasting approx 15sec. Patient's limbs easily moved during seizure and patient answers questions appropriately after seizure. Ammonia placed at bedside per EDP's instruction.

## 2013-04-21 ENCOUNTER — Emergency Department (HOSPITAL_COMMUNITY): Payer: Medicare Other

## 2013-04-21 ENCOUNTER — Emergency Department (HOSPITAL_COMMUNITY)
Admission: EM | Admit: 2013-04-21 | Discharge: 2013-04-21 | Disposition: A | Discharge status: Home / Self Care | Payer: Medicare Other | Attending: Emergency Medicine | Admitting: Emergency Medicine

## 2013-04-21 ENCOUNTER — Other Ambulatory Visit: Payer: Self-pay

## 2013-04-21 ENCOUNTER — Encounter (HOSPITAL_COMMUNITY): Payer: Self-pay | Admitting: Emergency Medicine

## 2013-04-21 DIAGNOSIS — S43016A Anterior dislocation of unspecified humerus, initial encounter: Secondary | ICD-10-CM | POA: Insufficient documentation

## 2013-04-21 DIAGNOSIS — Z79899 Other long term (current) drug therapy: Secondary | ICD-10-CM | POA: Insufficient documentation

## 2013-04-21 DIAGNOSIS — Y9210 Unspecified residential institution as the place of occurrence of the external cause: Secondary | ICD-10-CM | POA: Insufficient documentation

## 2013-04-21 DIAGNOSIS — Z8711 Personal history of peptic ulcer disease: Secondary | ICD-10-CM | POA: Insufficient documentation

## 2013-04-21 DIAGNOSIS — S4980XA Other specified injuries of shoulder and upper arm, unspecified arm, initial encounter: Secondary | ICD-10-CM | POA: Diagnosis present

## 2013-04-21 DIAGNOSIS — W06XXXA Fall from bed, initial encounter: Secondary | ICD-10-CM | POA: Insufficient documentation

## 2013-04-21 DIAGNOSIS — Y9389 Activity, other specified: Secondary | ICD-10-CM | POA: Diagnosis not present

## 2013-04-21 DIAGNOSIS — S43004A Unspecified dislocation of right shoulder joint, initial encounter: Secondary | ICD-10-CM

## 2013-04-21 DIAGNOSIS — F17200 Nicotine dependence, unspecified, uncomplicated: Secondary | ICD-10-CM | POA: Diagnosis not present

## 2013-04-21 DIAGNOSIS — F319 Bipolar disorder, unspecified: Secondary | ICD-10-CM | POA: Insufficient documentation

## 2013-04-21 DIAGNOSIS — F411 Generalized anxiety disorder: Secondary | ICD-10-CM | POA: Diagnosis not present

## 2013-04-21 DIAGNOSIS — G40909 Epilepsy, unspecified, not intractable, without status epilepticus: Secondary | ICD-10-CM | POA: Insufficient documentation

## 2013-04-21 DIAGNOSIS — Z91040 Latex allergy status: Secondary | ICD-10-CM | POA: Insufficient documentation

## 2013-04-21 DIAGNOSIS — Z8739 Personal history of other diseases of the musculoskeletal system and connective tissue: Secondary | ICD-10-CM | POA: Insufficient documentation

## 2013-04-21 DIAGNOSIS — IMO0002 Reserved for concepts with insufficient information to code with codable children: Secondary | ICD-10-CM | POA: Insufficient documentation

## 2013-04-21 DIAGNOSIS — Z9889 Other specified postprocedural states: Secondary | ICD-10-CM | POA: Insufficient documentation

## 2013-04-21 DIAGNOSIS — Z8719 Personal history of other diseases of the digestive system: Secondary | ICD-10-CM | POA: Insufficient documentation

## 2013-04-21 MED ORDER — ACETAMINOPHEN 325 MG PO TABS
650.0000 mg | ORAL_TABLET | Freq: Once | ORAL | Status: AC
  Administered 2013-04-21: 650 mg via ORAL
  Filled 2013-04-21: qty 2

## 2013-04-21 MED ORDER — ETOMIDATE 2 MG/ML IV SOLN
INTRAVENOUS | Status: AC
  Administered 2013-04-21: 16 mg via INTRAVENOUS
  Filled 2013-04-21: qty 10

## 2013-04-21 MED ORDER — ETOMIDATE 2 MG/ML IV SOLN
0.2000 mg/kg | Freq: Once | INTRAVENOUS | Status: AC
  Administered 2013-04-21: 16 mg via INTRAVENOUS

## 2013-04-21 NOTE — ED Notes (Signed)
[facility] via [facility] after "multiple pseudo-seizures". Pt states he had seizure-like activity last night and fell out of his bed and injured his right shoulder. Pt is unsure if he hit his head but denies LOC. Pt reports a second episode of seizure-like activity earlier today. Pt is A&O x4. Limited movement in right shoulder due to pain. Pt states he takes Depakote. Seizure pads applied.
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
## 2013-05-19 ENCOUNTER — Emergency Department (HOSPITAL_COMMUNITY)
Admission: EM | Admit: 2013-05-19 | Discharge: 2013-05-19 | Disposition: A | Discharge status: Home / Self Care | Payer: Medicare Other | Attending: Emergency Medicine | Admitting: Emergency Medicine

## 2013-05-19 ENCOUNTER — Encounter (HOSPITAL_COMMUNITY): Payer: Self-pay | Admitting: Emergency Medicine

## 2013-05-19 ENCOUNTER — Emergency Department (HOSPITAL_COMMUNITY): Payer: Medicare Other

## 2013-05-19 DIAGNOSIS — Z8739 Personal history of other diseases of the musculoskeletal system and connective tissue: Secondary | ICD-10-CM | POA: Insufficient documentation

## 2013-05-19 DIAGNOSIS — R609 Edema, unspecified: Secondary | ICD-10-CM

## 2013-05-19 DIAGNOSIS — F4310 Post-traumatic stress disorder, unspecified: Secondary | ICD-10-CM | POA: Insufficient documentation

## 2013-05-19 DIAGNOSIS — Z8719 Personal history of other diseases of the digestive system: Secondary | ICD-10-CM | POA: Insufficient documentation

## 2013-05-19 DIAGNOSIS — R5381 Other malaise: Secondary | ICD-10-CM | POA: Insufficient documentation

## 2013-05-19 DIAGNOSIS — F411 Generalized anxiety disorder: Secondary | ICD-10-CM | POA: Insufficient documentation

## 2013-05-19 DIAGNOSIS — R5383 Other fatigue: Secondary | ICD-10-CM

## 2013-05-19 DIAGNOSIS — F17200 Nicotine dependence, unspecified, uncomplicated: Secondary | ICD-10-CM | POA: Insufficient documentation

## 2013-05-19 DIAGNOSIS — G40909 Epilepsy, unspecified, not intractable, without status epilepticus: Secondary | ICD-10-CM | POA: Insufficient documentation

## 2013-05-19 DIAGNOSIS — Z9089 Acquired absence of other organs: Secondary | ICD-10-CM | POA: Insufficient documentation

## 2013-05-19 DIAGNOSIS — Z8711 Personal history of peptic ulcer disease: Secondary | ICD-10-CM | POA: Insufficient documentation

## 2013-05-19 DIAGNOSIS — F319 Bipolar disorder, unspecified: Secondary | ICD-10-CM | POA: Insufficient documentation

## 2013-05-19 DIAGNOSIS — Z91040 Latex allergy status: Secondary | ICD-10-CM | POA: Insufficient documentation

## 2013-05-19 DIAGNOSIS — Z79899 Other long term (current) drug therapy: Secondary | ICD-10-CM | POA: Insufficient documentation

## 2013-05-19 LAB — URINALYSIS, ROUTINE W REFLEX MICROSCOPIC
BILIRUBIN URINE: NEGATIVE
Glucose, UA: NEGATIVE mg/dL
HGB URINE DIPSTICK: NEGATIVE
Ketones, ur: NEGATIVE mg/dL
Leukocytes, UA: NEGATIVE
NITRITE: NEGATIVE
Protein, ur: NEGATIVE mg/dL
SPECIFIC GRAVITY, URINE: 1.015 (ref 1.005–1.030)
Urobilinogen, UA: 0.2 mg/dL (ref 0.0–1.0)
pH: 7 (ref 5.0–8.0)

## 2013-05-19 LAB — COMPREHENSIVE METABOLIC PANEL
ALT: 33 U/L (ref 0–53)
AST: 18 U/L (ref 0–37)
Albumin: 3.1 g/dL — ABNORMAL LOW (ref 3.5–5.2)
Alkaline Phosphatase: 54 U/L (ref 39–117)
BUN: 10 mg/dL (ref 6–23)
CO2: 32 mEq/L (ref 19–32)
CREATININE: 0.84 mg/dL (ref 0.50–1.35)
Calcium: 8.8 mg/dL (ref 8.4–10.5)
Chloride: 102 mEq/L (ref 96–112)
GFR calc Af Amer: >90 mL/min (ref >90)
GFR calc non Af Amer: >90 mL/min (ref >90)
Glucose, Bld: 116 mg/dL — ABNORMAL HIGH (ref 70–99)
Potassium: 4.1 mEq/L (ref 3.7–5.3)
Sodium: 143 mEq/L (ref 137–147)
TOTAL PROTEIN: 5.9 g/dL — AB (ref 6.0–8.3)

## 2013-05-19 LAB — CBC WITH DIFFERENTIAL/PLATELET
BASOS PCT: 1 % (ref 0–1)
Basophils Absolute: 0.1 10*3/uL (ref 0.0–0.1)
EOS ABS: 0.7 10*3/uL (ref 0.0–0.7)
EOS PCT: 7 % — AB (ref 0–5)
HEMATOCRIT: 37.7 % — AB (ref 39.0–52.0)
Hemoglobin: 13.2 g/dL (ref 13.0–17.0)
Lymphocytes Relative: 30 % (ref 12–46)
Lymphs Abs: 2.9 10*3/uL (ref 0.7–4.0)
MCH: 32 pg (ref 26.0–34.0)
MCHC: 35 g/dL (ref 30.0–36.0)
MCV: 91.3 fL (ref 78.0–100.0)
MONO ABS: 0.6 10*3/uL (ref 0.1–1.0)
MONOS PCT: 6 % (ref 3–12)
Neutro Abs: 5.4 10*3/uL (ref 1.7–7.7)
Neutrophils Relative %: 56 % (ref 43–77)
Platelets: 259 10*3/uL (ref 150–400)
RBC: 4.13 MIL/uL — ABNORMAL LOW (ref 4.22–5.81)
RDW: 13.1 % (ref 11.5–15.5)
WBC: 9.6 10*3/uL (ref 4.0–10.5)

## 2013-05-19 MED ORDER — FUROSEMIDE 20 MG PO TABS: 20.0000 mg | ORAL_TABLET | Freq: Every day | ORAL | Status: AC
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
## 2013-05-19 NOTE — ED Notes (Signed)
C/o swelling  Of lower body for the past week, states it is getting worse, according to family member urine output is low

## 2013-05-19 NOTE — Discharge Instructions (Signed)
Follow up with your md next week for recheck °

## 2014-06-08 ENCOUNTER — Emergency Department (HOSPITAL_COMMUNITY): Admission: EM | Admit: 2014-06-08 | Disposition: A | Discharge status: Home / Self Care | Payer: 59

## 2014-06-08 ENCOUNTER — Emergency Department (EMERGENCY_DEPARTMENT_HOSPITAL): Payer: 59

## 2014-06-08 DIAGNOSIS — M25511 Pain in right shoulder: Secondary | ICD-10-CM

## 2014-06-08 DIAGNOSIS — R569 Unspecified convulsions: Secondary | ICD-10-CM

## 2014-06-08 DIAGNOSIS — S39012A Strain of muscle, fascia and tendon of lower back, initial encounter: Secondary | ICD-10-CM

## 2014-06-25 IMAGING — CR DG CHEST 2V
2 series · 2 of 2 positions shown · non-contrast
Comparison: April 07, 2013

CLINICAL DATA: Chest pain

EXAM:
CHEST  2 VIEW

[view not recorded (1 of 2)]
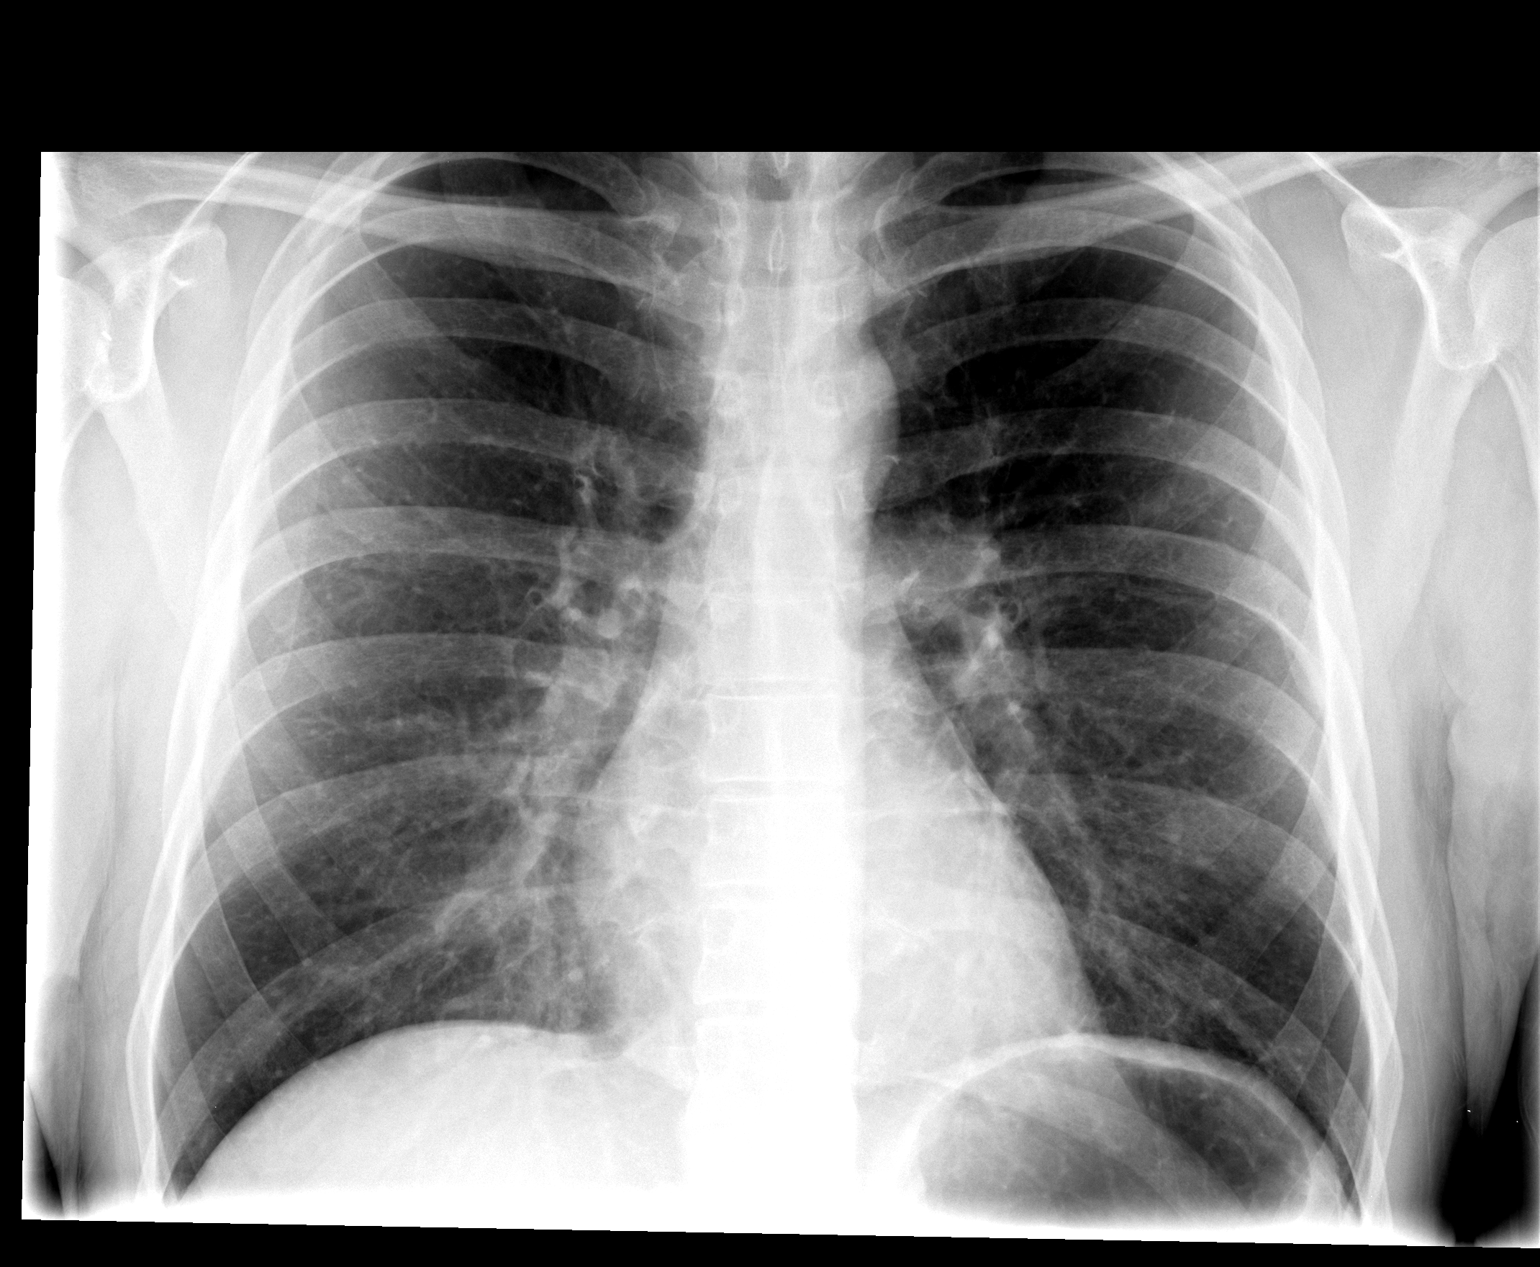

[view not recorded (2 of 2)]
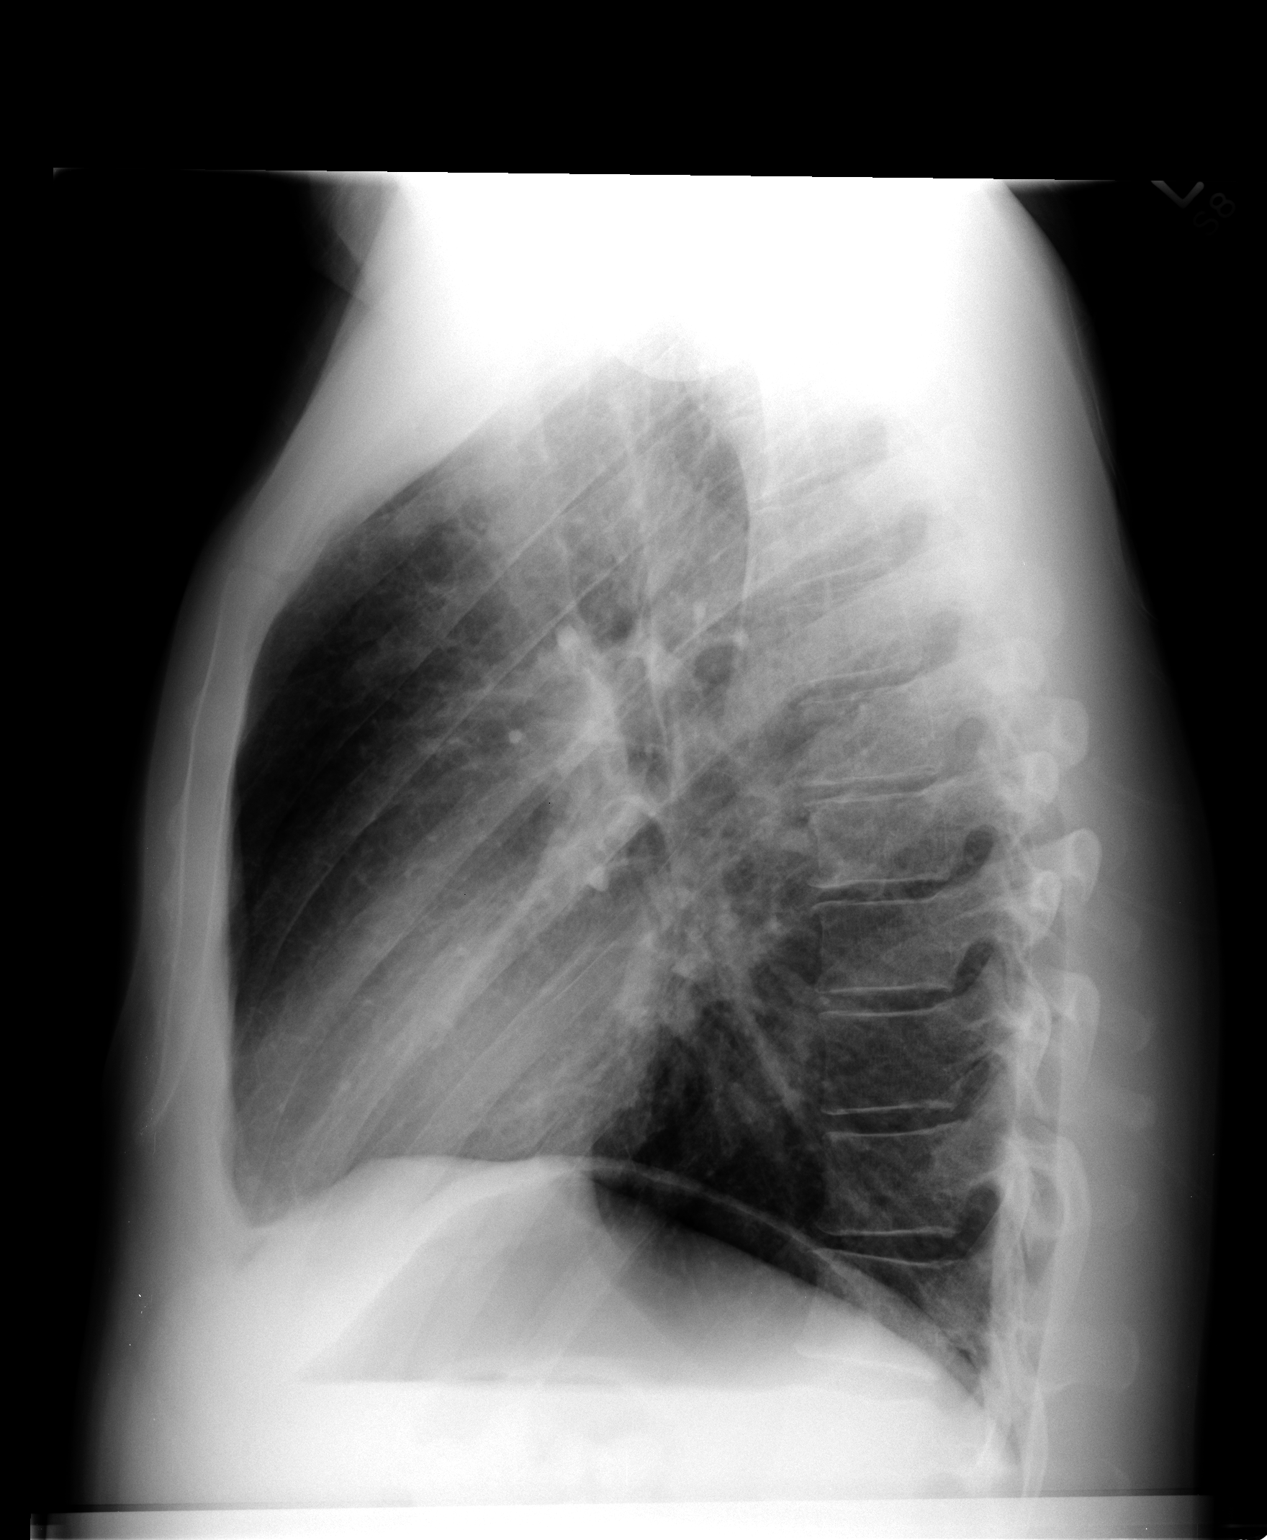

[2 of 2 positions shown; findings below may reference images not displayed]

FINDINGS: Lungs are clear. Heart size and pulmonary vascularity are normal. No
pneumothorax. No adenopathy. No bone lesions.
IMPRESSION: No abnormality noted.

## 2014-07-01 ENCOUNTER — Other Ambulatory Visit (INDEPENDENT_AMBULATORY_CARE_PROVIDER_SITE_OTHER): Payer: Self-pay | Admitting: Physician Assistant

## 2014-07-01 DIAGNOSIS — R52 Pain, unspecified: Secondary | ICD-10-CM

## 2014-07-06 ENCOUNTER — Ambulatory Visit (INDEPENDENT_AMBULATORY_CARE_PROVIDER_SITE_OTHER): Payer: Self-pay | Admitting: Physician Assistant

## 2014-07-07 ENCOUNTER — Ambulatory Visit (INDEPENDENT_AMBULATORY_CARE_PROVIDER_SITE_OTHER): Payer: Self-pay | Admitting: Physician Assistant
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
## 2015-03-30 ENCOUNTER — Ambulatory Visit (HOSPITAL_COMMUNITY): Payer: Self-pay

## 2018-07-13 ENCOUNTER — Emergency Department (HOSPITAL_COMMUNITY): Payer: Self-pay | Admitting: Emergency Medicine

## 2019-01-07 ENCOUNTER — Encounter (FREE_STANDING_LABORATORY_FACILITY)
Admit: 2019-01-07 | Discharge: 2019-01-07 | Disposition: A | Discharge status: Home / Self Care | Payer: Self-pay | Attending: NURSE PRACTITIONER | Admitting: NURSE PRACTITIONER

## 2019-01-07 ENCOUNTER — Other Ambulatory Visit (FREE_STANDING_LABORATORY_FACILITY): Payer: Self-pay | Admitting: NURSE PRACTITIONER

## 2019-01-07 LAB — HEPATITIS B CORE IGM, AB: HBV CORE IGM ANTIBODY QUALITATIVE: NEGATIVE

## 2019-01-07 LAB — HEPATITIS A (HAV) IGM ANTIBODY: HAV IGM: NEGATIVE

## 2019-01-08 LAB — HEPATITIS C ANTIBODY SCREEN WITH REFLEX TO HCV PCR: HCV ANTIBODY QUALITATIVE: REACTIVE — AB

## 2019-01-13 LAB — HEPATITIS C VIRUS (HCV) RNA DETECTION AND QUANTIFICATION, PCR, PLASMA
HCV QUANTITATIVE PCR: 3090000 [IU]/mL — ABNORMAL HIGH
HCV QUANTITATIVE RNA LOG: 6.49 LOG10 — ABNORMAL HIGH

## 2019-02-04 ENCOUNTER — Ambulatory Visit (HOSPITAL_COMMUNITY): Payer: Self-pay | Admitting: Family Medicine

## 2019-02-06 ENCOUNTER — Inpatient Hospital Stay (HOSPITAL_COMMUNITY)
Admission: EM | Admit: 2019-02-06 | Discharge: 2019-02-06 | Disposition: A | Discharge status: Home / Self Care | Payer: Medicare Other | Source: Other Acute Inpatient Hospital

## 2019-02-06 ENCOUNTER — Encounter (HOSPITAL_COMMUNITY): Payer: Self-pay

## 2019-02-06 DIAGNOSIS — M4646 Discitis, unspecified, lumbar region: Secondary | ICD-10-CM

## 2019-02-06 DIAGNOSIS — M869 Osteomyelitis, unspecified: Secondary | ICD-10-CM

## 2019-02-06 DIAGNOSIS — M5416 Radiculopathy, lumbar region: Secondary | ICD-10-CM

## 2019-02-07 DIAGNOSIS — G061 Intraspinal abscess and granuloma: Secondary | ICD-10-CM

## 2019-02-07 DIAGNOSIS — A419 Sepsis, unspecified organism: Secondary | ICD-10-CM

## 2019-02-07 DIAGNOSIS — D72829 Elevated white blood cell count, unspecified: Secondary | ICD-10-CM

## 2019-02-07 DIAGNOSIS — D509 Iron deficiency anemia, unspecified: Secondary | ICD-10-CM

## 2019-02-07 DIAGNOSIS — Z8659 Personal history of other mental and behavioral disorders: Secondary | ICD-10-CM

## 2019-02-07 DIAGNOSIS — M4646 Discitis, unspecified, lumbar region: Secondary | ICD-10-CM

## 2019-02-07 DIAGNOSIS — M4626 Osteomyelitis of vertebra, lumbar region: Secondary | ICD-10-CM

## 2019-03-02 ENCOUNTER — Inpatient Hospital Stay (HOSPITAL_COMMUNITY): Admit: 2019-03-02 | Payer: Self-pay

## 2019-03-02 DIAGNOSIS — Z5321 Procedure and treatment not carried out due to patient leaving prior to being seen by health care provider: Secondary | ICD-10-CM | POA: Insufficient documentation

## 2019-04-02 ENCOUNTER — Inpatient Hospital Stay (HOSPITAL_COMMUNITY)
Admission: EM | Admit: 2019-04-02 | Discharge: 2019-04-02 | Disposition: A | Discharge status: Home / Self Care | Payer: Medicare Other | Source: Other Acute Inpatient Hospital

## 2019-04-02 ENCOUNTER — Encounter (HOSPITAL_COMMUNITY): Payer: Medicare Other

## 2019-04-02 DIAGNOSIS — Z20828 Contact with and (suspected) exposure to other viral communicable diseases: Secondary | ICD-10-CM

## 2019-04-02 DIAGNOSIS — R509 Fever, unspecified: Secondary | ICD-10-CM

## 2019-04-02 DIAGNOSIS — M4646 Discitis, unspecified, lumbar region: Secondary | ICD-10-CM

## 2019-04-02 DIAGNOSIS — F319 Bipolar disorder, unspecified: Secondary | ICD-10-CM

## 2019-04-02 DIAGNOSIS — F17210 Nicotine dependence, cigarettes, uncomplicated: Secondary | ICD-10-CM

## 2019-04-02 DIAGNOSIS — M4626 Osteomyelitis of vertebra, lumbar region: Secondary | ICD-10-CM

## 2019-04-02 DIAGNOSIS — A419 Sepsis, unspecified organism: Secondary | ICD-10-CM

## 2019-04-04 ENCOUNTER — Other Ambulatory Visit: Payer: Self-pay

## 2019-04-04 ENCOUNTER — Inpatient Hospital Stay (HOSPITAL_COMMUNITY): Admission: AD | Admit: 2019-04-04 | Payer: Self-pay | Source: Other Acute Inpatient Hospital | Admitting: Anesthesiology

## 2019-04-04 DIAGNOSIS — M40205 Unspecified kyphosis, thoracolumbar region: Secondary | ICD-10-CM

## 2019-04-04 DIAGNOSIS — M532X6 Spinal instabilities, lumbar region: Secondary | ICD-10-CM

## 2019-04-06 DIAGNOSIS — M4626 Osteomyelitis of vertebra, lumbar region: Secondary | ICD-10-CM

## 2019-04-07 DIAGNOSIS — M4626 Osteomyelitis of vertebra, lumbar region: Secondary | ICD-10-CM

## 2019-04-07 DIAGNOSIS — M40299 Other kyphosis, site unspecified: Secondary | ICD-10-CM

## 2019-04-07 DIAGNOSIS — M4646 Discitis, unspecified, lumbar region: Secondary | ICD-10-CM

## 2019-04-08 ENCOUNTER — Other Ambulatory Visit (INDEPENDENT_AMBULATORY_CARE_PROVIDER_SITE_OTHER): Payer: Self-pay | Admitting: Neurological Surgery

## 2019-04-08 DIAGNOSIS — Z981 Arthrodesis status: Secondary | ICD-10-CM

## 2019-04-22 ENCOUNTER — Encounter (INDEPENDENT_AMBULATORY_CARE_PROVIDER_SITE_OTHER): Payer: Self-pay | Admitting: Nurse Practitioner

## 2019-04-22 ENCOUNTER — Other Ambulatory Visit: Payer: Self-pay

## 2019-04-22 ENCOUNTER — Ambulatory Visit (INDEPENDENT_AMBULATORY_CARE_PROVIDER_SITE_OTHER): Payer: Medicare Other | Admitting: Nurse Practitioner

## 2019-04-22 VITALS — HR 119 | Temp 98.2°F | Ht 72.0 in | Wt 185.0 lb

## 2019-04-22 DIAGNOSIS — Z981 Arthrodesis status: Secondary | ICD-10-CM

## 2019-04-22 NOTE — Progress Notes (Signed)
[place] Department of Neurosurgery  New Outpatient/Consult    Bryan [name]  Date of Service: 04/22/2019  Referring physician: No referring provider defined for this encounter.    Gender: male  Handedness: Right handed    Chief Complaint:   Chief Complaint   Patient presents with   . Post Op     staple removal     History is provided by patient    History of Present Illness  This is a 43 year old male who presents today for staple removal. He is s/p Bilateral L3-S1 PSF that was performed by Dr. Noel Gerold on 04/08/19. He was initially seen by Dr. 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 He was treated with IV antibiotics. He then reported to [facility] in November 2020 where he underwent a L4-5 laminectomy that was performed by Dr. Cheryll Dessert, he reportedly left AMA after that surgery and was not given his full course of antibiotics. He then presents to [facility] ER with complaints of fever and increasing LBP. He is currently residing at [facility] and is receiving IV antibiotics. He states that he does still appreciate LBP but he does feel that it has improved some. He denies any lower extremity pain. He denies any incisional complaints, fevers or drainage. He does not have an imaging to review for today's appointment.     Past History  No current outpatient medications on file.         Family History  Family Medical History:     None              Social History  Social History     Socioeconomic History   . Marital status: Unknown     Spouse name: Not on file   . Number of children: Not on file   . Years of education: Not on file   . Highest education level: Not on file       Review of Systems  Other than ROS in the HPI, all other systems were negative.    Examination  There were no vitals taken for this visit.        Constitutional  General appearance: Normal  Cardiovascular:   Carotid arteries: Normal. No bruits noted.  Auscultation:  NormalRRR  Peripheral vascular system: Normal Peripheral pulses 2+ bilaterally  Lungs:  CTA bilaterally  Abdominal:  Soft and non tender.  Musculoskeletal  Gait and Station: : Normal  Muscle strength (upper extremities): : Normal  Muscle strength (lower extremities): : Normal  Muscle tone (upper extremities): : Normal  Muscle tone (lower extremities): : Normal  Sensation: Normal  Deep tendon reflexes upper and lower extremities: Normal  Coordination: Normal RAM and finger to nose intact.  Ankle Clonus: Negative bilaterally  Babinski: Negative bilaterally  Hoffman's: Negative bilaterally  Neurological  Orientation: Normal  Recent and remote memory: Normal  Attention span and concentration: Normal  Language: Normal  Fund of knowledge: Normal  Cranial Nerves  2nd: Normal PERRL. No obvious visual field cuts.  3rd,4th,6th: Normal EOMI  5th: Normal Facial sensation intact.  7th: Normal Face symmetrical, no droop appreciated  8th: Normal Hearing is grossly intact.  9th: Normal Palate symmetric, uvula elevates in the midline.  11th: Normal Normal shoulder shrug.  12th: Normal Tongue midline  Incision is closed, flat and dry.  Staples removed    Assessment:      ICD-10-CM    1. S/P lumbar fusion  Z98.1 XR LUMBAR  SPINE AP/LAT/SPOT       Treatment Plan  Follow up in 2 weeks or sooner if needed.   Xray slip provided to patient.     The patient was seen independently.    Ruthy Dick, APRN,NP-C 04/22/2019, 11:47

## 2019-04-28 ENCOUNTER — Encounter (INDEPENDENT_AMBULATORY_CARE_PROVIDER_SITE_OTHER): Payer: Self-pay | Admitting: Family

## 2019-05-13 ENCOUNTER — Ambulatory Visit (INDEPENDENT_AMBULATORY_CARE_PROVIDER_SITE_OTHER): Payer: Medicare Other | Admitting: Neurological Surgery

## 2019-05-13 ENCOUNTER — Other Ambulatory Visit (INDEPENDENT_AMBULATORY_CARE_PROVIDER_SITE_OTHER): Payer: Self-pay

## 2019-05-13 ENCOUNTER — Encounter (INDEPENDENT_AMBULATORY_CARE_PROVIDER_SITE_OTHER): Payer: Self-pay | Admitting: Neurological Surgery

## 2019-05-13 ENCOUNTER — Other Ambulatory Visit: Payer: Self-pay

## 2019-05-13 VITALS — BP 98/60 | HR 81 | Temp 95.2°F | Wt 205.0 lb

## 2019-05-13 DIAGNOSIS — Z981 Arthrodesis status: Secondary | ICD-10-CM

## 2019-05-13 DIAGNOSIS — Z9889 Other specified postprocedural states: Secondary | ICD-10-CM

## 2019-05-13 NOTE — Progress Notes (Signed)
[place] Department of Neurosurgery  New Outpatient/Consult    [name]  Date of Service: 05/13/2019  Referring physician: No referring provider defined for this encounter.    Gender: male  Handedness: Right handed  Marital Status: Divorced   Job Title (or Former Job): none    Chief Complaint:   Chief Complaint   Patient presents with   . Post Op     1 month post op     History is provided by patient    History of Present Illness; this is a 44 year old male who underwent bilateral L3-S1 pedicle screw fixation for discitis at [facility] by dr Noel Gerold on 04/07/19, he reports improved lbp and left leg pain , he is currently at [facility] in [address] for 1 more week then he will be discharged to home, he has left arm PICC FOR His ANTIBIOTICS that are infused every 8 hours but patient not aware of what kind of antibiotics, he is wearing his brace and had lumbar xray yesterday at [facility]. Incision closed, flat and dry  Past History    Current Outpatient Medications:   .  PARoxetine (PAXIL) 30 mg Oral Tablet, , Disp: , Rfl:   .  QUEtiapine (SEROQUEL) 100 mg Oral Tablet, , Disp: , Rfl:   .  Saccharomyces boulardii (FLORASTOR) 250 mg Oral Capsule, , Disp: , Rfl:    Allergies   Allergen Reactions   . Tramadol Rash     History reviewed. No pertinent past medical history.    Past Surgical History:   Procedure Laterality Date   . HX LUMBAR SPINE SURGERY         Family History  Family Medical History:     None              Social History  Social History     Socioeconomic History   . Marital status: Unknown     Spouse name: Not on file   . Number of children: Not on file   . Years of education: Not on file   . Highest education level: Not on file   Tobacco Use   . Smoking status: Current Every Day Smoker     Packs/day: 1.00     Years: 30.00     Pack years: 30.00     Types: Cigarettes   . Smokeless tobacco: Never Used   Substance and Sexual Activity   . Alcohol use: Not Currently   . Drug use: Yes    Frequency: 7.0 times per week     Types: Marijuana       Review of Systems  Other than ROS in the HPI, all other systems were negative.    Examination  BP 98/60   Pulse 81   Temp 35.1 C (95.2 F) (Tympanic)   Wt 93 kg (205 lb)   SpO2 96%   BMI 27.80 kg/m         Constitutional  General appearance: Normal  HENT:  Normal  Skin:  Normal,   Hem/Lymph:  Normal  Respiratory:  Normal.   Gastrointestinal:  Not done  Cardiovascular:    Carotid arteries: Not done  Auscultation: Normal  Peripheral vascular system: Normal  Musculoskeletal  Gait and Station: : Normal  Muscle strength (upper extremities): : Normal  Muscle strength (lower extremities): : Normal  Muscle tone (upper extremities): : Normal  Muscle tone (lower extremities): : Normal  Sensation: Normal  Deep tendon reflexes upper and lower extremities: Normal  Coordination: Normal  Neurological  Orientation: Normal  Recent and remote memory: Normal  Attention span and concentration: Normal  Language: Normal  Fund of knowledge: Normal  Cranial Nerves  intact  Meningeal signs: none      Data reviewed        Discussions with other providers:     Assessment:      ICD-10-CM    1. Status post lumbar surgery  Z98.890        Treatment Plan;1. Wear brace when upright  2. Fu in 1 month with xray and crp/sed rate    The patient was seen as a shared visit with the co-signing faculty.    Kae Heller, APRN,NP-C 05/13/2019, 10:50    With Dr. Patrice Paradise

## 2019-05-13 NOTE — Progress Notes (Signed)
I personally saw and evaluated the patient. See mid-level's note for additional details. My findings/participation are as below.    Mr. Bryan [name] presents for postop f/u from his b/l L5-S1 PSF performed at [facility] on 04/07/2019 in the setting of lumbar discitis.  He was being treated for serratia, and is on a 6 week course of Avycaz, which he is currently receiving at [place].  Bryan [name] tells me that he has been doing well, with some continued lumbar pain, but increased mobility.  His incision has healed well and he is neurologically stable.  Lumbar x-rays performed today reveal grossly unchanged position of the screw/rod construct.  I will see Bryan [name] back in 1 month with another set of lumbar x-rays, and he will continue wearing his brace when upright and avoiding heavy lifting > 15 lbs.  We will also check an ESR and CRP prior to his next visit.    Debby Bud, MD

## 2019-06-10 ENCOUNTER — Encounter (INDEPENDENT_AMBULATORY_CARE_PROVIDER_SITE_OTHER): Payer: Self-pay | Admitting: Neurological Surgery

## 2019-06-18 ENCOUNTER — Other Ambulatory Visit (INDEPENDENT_AMBULATORY_CARE_PROVIDER_SITE_OTHER): Payer: Self-pay

## 2019-06-18 ENCOUNTER — Telehealth (INDEPENDENT_AMBULATORY_CARE_PROVIDER_SITE_OTHER): Payer: Self-pay

## 2019-06-18 DIAGNOSIS — M4640 Discitis, unspecified, site unspecified: Secondary | ICD-10-CM

## 2019-06-18 NOTE — Nursing Note (Signed)
Spoke with medical staff, Valentino Saxon, at [facility] in response to the request to wait until his release for his follow up.  Per Dr. Wells Guiles instruction patient should be following up for surgery done in December and treatment of infection.  Orders, appointment, and instructions were all faxed to [phone].  Dr. Noel Gerold also highly recommended that the patient follow up with infectious disease physician s/p treatment for discitis.      Blima Dessert, RN  06/18/2019, 14:25

## 2019-06-29 ENCOUNTER — Other Ambulatory Visit: Payer: Self-pay

## 2019-06-29 ENCOUNTER — Ambulatory Visit (INDEPENDENT_AMBULATORY_CARE_PROVIDER_SITE_OTHER): Payer: Medicare Other | Admitting: Neurological Surgery

## 2019-06-29 ENCOUNTER — Encounter (INDEPENDENT_AMBULATORY_CARE_PROVIDER_SITE_OTHER): Payer: Self-pay | Admitting: Neurological Surgery

## 2019-06-29 VITALS — BP 110/76 | HR 86 | Temp 97.6°F | Wt 194.0 lb

## 2019-06-29 DIAGNOSIS — Z981 Arthrodesis status: Secondary | ICD-10-CM
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
## 2019-06-29 NOTE — Progress Notes (Signed)
I personally saw and evaluated the patient. See mid-level's note for additional details. My findings/participation are as below.    Bryan [name] presents for further postop f/u from his b/l L3-S1 PSF (my previous note incorrectly stated L5-S1) performed at [facility] in December 2020 in the setting of lumbar discitis.  He has being treated for serratia with 6 weeks of Avycaz, which he has finished, and he is now incarcerated.  Bryan [name] tells me that he has been doing well, with some continued lumbar pain, which has improved from preoperatively.  His incision has healed well and he is neurologically stable.  Lumbar x-rays performed prior to his appointment reveal grossly unchanged position of the screw/rod construct, and his blood work reveals a normal ESR of 2 and CRP of 0.2.  I will see Bryan [name] back in 3 months with another set of lumbar x-rays.  There are no longer any postop restrictions in place and he does not need a brace.  He should also f/u with ID.    Debby Bud, MD

## 2019-10-05 ENCOUNTER — Encounter (INDEPENDENT_AMBULATORY_CARE_PROVIDER_SITE_OTHER): Payer: Self-pay | Admitting: Neurological Surgery

## 2019-10-05 ENCOUNTER — Other Ambulatory Visit: Payer: Self-pay

## 2019-10-05 ENCOUNTER — Ambulatory Visit (INDEPENDENT_AMBULATORY_CARE_PROVIDER_SITE_OTHER): Payer: Medicare Other | Admitting: Neurological Surgery

## 2019-10-05 VITALS — BP 112/78 | HR 95 | Temp 96.8°F | Wt 210.6 lb

## 2019-10-05 DIAGNOSIS — Z9889 Other specified postprocedural states: Secondary | ICD-10-CM

## 2019-10-05 DIAGNOSIS — M545 Low back pain, unspecified: Secondary | ICD-10-CM

## 2019-10-05 NOTE — Progress Notes (Signed)
I personally saw and evaluated the patient. See mid-level's note for additional details. My findings/participation areas below.    Mr. Bryan [name] presents for further postop f/u from his b/l L3-S1 PSF performed at [facility] in December 2020 in the setting of lumbar discitis. He was treated for serratia with 6 weeks of Avycaz.  Mr. Bryan [name] tells me that he continued to do well, with some continued lumbar pain which he rates at ~4/10, improved from preoperatively.  His incision has healed well and he is neurologically stable. Lumbar x-rays performed today reveal grossly unchanged position of the screw/rod construct.  I will see Mr. Bryan [name] back in 3 months with another set of lumbar x-rays.  He does not appear to have followed up with ID.    Janeann Merl, MD

## 2019-10-05 NOTE — Progress Notes (Signed)
[facility]  New Outpatient/Consult    Bryan [name]  Date of Service: 10/05/2019  Referring physician: No referring provider defined for this encounter.    Gender: male  Handedness: Right handed  Marital Status: Single   Job Title (or Former Job): none    Chief Complaint:   Chief Complaint   Patient presents with   . Follow-up After Testing     x-ray today     History is provided by patient    History of Present Illness; this is a 44 year old male who underwent L3-S1 psf at [facility] by Dr Patrice Paradise in dec 2020 for lumbar discitis , he was treated for serratia and was on 6 weeks of AVYCAZ , He was in [facility] but now home and doing well with improved LBP, has left leg numbness. Had xray today, he reports that he never followed up with ID. Denies fever or chills, last lab work done in march showed crp 0.12 and sed rate 2.   Past History    Current Outpatient Medications:   .  buprenorphine HCL (SUBUTEX) 2 mg Sublingual Tablet, Sublingual, 4 mg by Sublingual route Once a day, Disp: , Rfl:    Allergies   Allergen Reactions   . Tramadol Rash     History reviewed. No pertinent past medical history.    Past Surgical History:   Procedure Laterality Date   . HX LUMBAR SPINE SURGERY         Family History  Family Medical History:     None              Social History  Social History     Socioeconomic History   . Marital status: Unknown     Spouse name: Not on file   . Number of children: Not on file   . Years of education: Not on file   . Highest education level: Not on file   Tobacco Use   . Smoking status: Current Every Day Smoker     Packs/day: 1.00     Years: 30.00     Pack years: 30.00     Types: Cigarettes   . Smokeless tobacco: Never Used   Substance and Sexual Activity   . Alcohol use: Not Currently   . Drug use: Not Currently     Frequency: 7.0 times per week     Types: Marijuana     Social Determinants of Health     Financial Resource Strain:    . Difficulty of Paying Living Expenses:      Food Insecurity:    . Worried About Charity fundraiser in the Last Year:    . Arboriculturist in the Last Year:    Transportation Needs:    . Film/video editor (Medical):    Marland Kitchen Lack of Transportation (Non-Medical):    Physical Activity:    . Days of Exercise per Week:    . Minutes of Exercise per Session:    Stress:    . Feeling of Stress :    Intimate Partner Violence:    . Fear of Current or Ex-Partner:    . Emotionally Abused:    Marland Kitchen Physically Abused:    . Sexually Abused:        Review of Systems  Other than ROS in the HPI, all other systems were negative.    Examination  BP 112/78   Pulse 95   Temp 36 C (96.8  F) (Tympanic)   Wt 95.5 kg (210 lb 9.6 oz)   SpO2 96%   BMI 28.56 kg/m         Constitutional  General appearance: Normal  HENT:  Normal  Skin:  Normal,   Hem/Lymph:  Normal  Respiratory:  Normal.   Gastrointestinal:  Normal  Cardiovascular:    Carotid arteries: Normal  Auscultation: Normal  Peripheral vascular system: Normal  Musculoskeletal  Gait and Station: : Normal  Muscle strength (upper extremities): : Normal  Muscle strength (lower extremities): : Normal  Muscle tone (upper extremities): : Normal  Muscle tone (lower extremities): : Normal  Sensation: Normal  Deep tendon reflexes upper and lower extremities: Normal   Coordination: Normal  Neurological  Orientation: Normal  Recent and remote memory: Normal  Attention span and concentration: Normal  Language: Normal  Fund of knowledge: Normal  Cranial Nerves  2nd: Normal  3rd,4th,6th: Normal   5th: Normal  7th: Normal   8th: Normal   9th: Normal  11th: Normal  12th: Normal   Meningeal signs: none      Data reviewed        Discussions with other providers:     Assessment:      ICD-10-CM    1. Lower back pain  M54.5    2. S/P lumbar spine operation  Z98.890        Treatment Plan; follow-up in 3 months with lumbar x-rays    The patient was seen as a shared visit with the co-signing faculty.    Laurence Aly, APRN,NP-C 10/05/2019,  10:16    With Dr. Noel Gerold

## 2019-12-18 ENCOUNTER — Emergency Department (HOSPITAL_COMMUNITY): Payer: Medicare Other

## 2019-12-18 ENCOUNTER — Encounter (HOSPITAL_COMMUNITY): Payer: Self-pay

## 2019-12-18 ENCOUNTER — Other Ambulatory Visit: Payer: Self-pay

## 2019-12-18 ENCOUNTER — Emergency Department
Admission: EM | Admit: 2019-12-18 | Discharge: 2019-12-18 | Disposition: A | Discharge status: Home / Self Care | Payer: Medicare Other | Attending: Emergency Medicine | Admitting: Emergency Medicine

## 2019-12-18 DIAGNOSIS — F17210 Nicotine dependence, cigarettes, uncomplicated: Secondary | ICD-10-CM | POA: Insufficient documentation

## 2019-12-18 DIAGNOSIS — R42 Dizziness and giddiness: Secondary | ICD-10-CM | POA: Insufficient documentation

## 2019-12-18 DIAGNOSIS — R001 Bradycardia, unspecified: Secondary | ICD-10-CM

## 2019-12-18 LAB — BASIC METABOLIC PANEL
ANION GAP: 3 mmol/L — ABNORMAL LOW (ref 4–13)
BUN/CREA RATIO: 19 (ref 6–22)
BUN: 15 mg/dL (ref 8–25)
CALCIUM: 9.1 mg/dL (ref 8.5–10.0)
CHLORIDE: 106 mmol/L (ref 96–111)
CO2 TOTAL: 34 mmol/L — ABNORMAL HIGH (ref 22–30)
CREATININE: 0.81 mg/dL (ref 0.75–1.35)
ESTIMATED GFR: >90 mL/min/BSA (ref >=60)
GLUCOSE: 72 mg/dL (ref 65–125)
POTASSIUM: 3.8 mmol/L (ref 3.5–5.1)
SODIUM: 143 mmol/L (ref 136–145)

## 2019-12-18 LAB — CBC WITH DIFF
BASOPHIL %: 1 % (ref 0–3)
EOSINOPHIL %: 6 % (ref 0–7)
HCT: 41.3 % — ABNORMAL LOW (ref 43.5–53.7)
HGB & HCT check: 0.4
HGB: 13.9 g/dL — ABNORMAL LOW (ref 14.1–18.1)
LYMPHOCYTE %: 39 % (ref 10–50)
MCH: 29.9 pg (ref 27.0–31.2)
MCHC: 33.6 g/dL (ref 31.8–35.4)
MCV: 89 fL (ref 80.0–97.0)
MONOCYTE %: 9 % (ref 0–12)
NEUTROPHIL %: 45 % (ref 37–80)
PLATELETS: 229 10*3/uL (ref 142–424)
RBC: 4.64 10*6/uL — ABNORMAL LOW (ref 4.69–6.13)
RDW: 14.3 % (ref 11.6–14.9)
WBC: 6.5 10*3/uL (ref 4.6–10.2)

## 2019-12-18 LAB — PT/INR
INR: 1.02 (ref 0.00–3.50)
PROTHROMBIN TIME: 10.4 s (ref 10.0–11.0)

## 2019-12-18 LAB — PTT (PARTIAL THROMBOPLASTIN TIME): APTT: 28.2 s (ref 25.0–30.0)

## 2019-12-18 MED ORDER — MECLIZINE 25 MG TABLET
25.0000 mg | ORAL_TABLET | Freq: Three times a day (TID) | ORAL | 0 refills | Status: DC | PRN
Start: 2019-12-18 — End: 2020-04-18

## 2019-12-18 NOTE — ED Provider Notes (Signed)
Bryan [name]  Dec 01, 1975  44 y.o.  male    Chief Complaint:   Chief Complaint   Patient presents with   . Stiff Neck   . Dizziness       HPI: This is a 44 y.o. male who presents to the emergency department via private vehicle complaining of stiff neck that started yesterday and complaining of feeling "dizzy" that started this morning around 5:30 a.m..  He describes the dizziness more as lightheadedness and feels like he is drunk.  States he feels lawn steady when he walks.  He denies any headache or visual changes.  Denies any trauma.  Denies any anticoagulant use.  He states he had recently started back on Seroquel and Paxil in the past week.  States he had COVID about a month ago.  He has not had his vaccination.  He denies any numbness or paresthesias.  Denies any chest pain or palpitations.  Denies any abdominal pain.  Denies any nausea, vomiting or diarrhea or genitourinary complaints.  Denies any vertigo.  Denies any earache or sore throat.  Denies any other complaints.          Past Medical History: History reviewed. No pertinent past medical history.    Past Surgical History:   Past Surgical History:   Procedure Laterality Date   . Ankle surgery     . Hx hernia repair     . Hx lumbar spine surgery     . Hx rotator cuff repair Right        Social History:   Social History     Tobacco Use   . Smoking status: Current Every Day Smoker     Packs/day: 1.00     Years: 30.00     Pack years: 30.00     Types: Cigarettes   . Smokeless tobacco: Never Used   Substance Use Topics   . Alcohol use: Not Currently   . Drug use: Not Currently     Frequency: 7.0 times per week     Types: Marijuana      Social History     Substance and Sexual Activity   Drug Use Not Currently   . Frequency: 7.0 times per week   . Types: Marijuana       Allergies:   Allergies   Allergen Reactions   . Tramadol Rash         Review of Systems: Other than pertinent positives discussed in HPI, all other systems reviewed and are  negative.      Physical Exam    BP 113/66   Pulse 57   Temp 35.8 C (96.5 F)   Resp 18   Ht 1.829 m (6')   Wt 90.7 kg (200 lb)   SpO2 98%   BMI 27.12 kg/m         General:  Patient alert and oriented.  Appears well nourished and in no acute distress.    HEENT:  Head:  Normocephalic, atraumatic.  Eyes: Normal conjunctiva.  Pupils are equal and reactive.  Extraocular muscles grossly intact.   Oropharynx: Oral mucosa is moist.  Airway is patent without stridor.  No nystagmus.  Tympanic membranes are clear and equal bilaterally.  Posterior pharynx was clear.  He has equal facial symmetry.  Speech is fluent.    Neck: Supple.  No lymphadenopathy.  No JVD or thyroid masses palpated.  No meningismus.    Respiratory:  Lungs sounds clear and equal bilaterally.  No wheezing, rales or  rhonchi.  He is not tachypneic.  No acute respiratory distress noted.    Cardiovascular:  Heart was regular rate and rhythm to a mild bradycardia.  without murmurs, rubs, or gallops.  Strong peripheral pulses present bilaterally.      Abdomen: Soft, non-tender.  No rebound, guarding or rigidity noted.  Bowel sounds are present and normal.    Extremities:  Normal pulses. No clubbing, cyanosis, or erythema or edema.    MS:  No ROM deficit.  Normal strength bilateral upper and lower extremities.    Neuro: Alert and oriented to person, place, time and situation.  Cranial nerves II-XII grossly intact.  No gross focal motor or sensory deficits appreciated.  Speech is fluent.  No pronator drift noted.  Rapid finger motion was normal.  Good finger-to-nose technique.  No ataxia.  No skew.  Ambulates without difficulty.    Skin:  Warm and dry.  No cyanosis.        EKG:  12 lead EKG was performed interpreted myself showing a sinus bradycardia at 48 beats per minute.  PR interval 172 milliseconds.  QRS axis 2.  No acute ST segment elevation, depression or ectopy noted        Laboratory Data:   Results for orders placed or performed during the  hospital encounter of 12/18/19 (from the past 12 hour(s))   BASIC METABOLIC PANEL   Result Value Ref Range    SODIUM 143 136 - 145 mmol/L    POTASSIUM 3.8 3.5 - 5.1 mmol/L    CHLORIDE 106 96 - 111 mmol/L    CO2 TOTAL 34 (H) 22 - 30 mmol/L    ANION GAP 3 (L) 4 - 13 mmol/L    CALCIUM 9.1 8.5 - 10.0 mg/dL    GLUCOSE 72 65 - 932 mg/dL    BUN 15 8 - 25 mg/dL    CREATININE 3.55 7.32 - 1.35 mg/dL    BUN/CREA RATIO 19 6 - 22    ESTIMATED GFR >90 >=60 mL/min/BSA   PT/INR   Result Value Ref Range    PROTHROMBIN TIME 10.4 10.0 - 11.0 seconds    INR 1.02 0.00 - 3.50   PTT (PARTIAL THROMBOPLASTIN TIME)   Result Value Ref Range    APTT 28.2 25.0 - 30.0 seconds   CBC WITH DIFF   Result Value Ref Range    WBC 6.5 4.6 - 10.2 x10^3/uL    RBC 4.64 (L) 4.69 - 6.13 x10^6/uL    HGB 13.9 (L) 14.1 - 18.1 g/dL    HCT 20.2 (L) 54.2 - 53.7 %    MCV 89.0 80.0 - 97.0 fL    MCH 29.9 27.0 - 31.2 pg    MCHC 33.6 31.8 - 35.4 g/dL    RDW 70.6 23.7 - 62.8 %    PLATELETS 229 142 - 424 x10^3/uL    NEUTROPHIL % 45 37 - 80 %    LYMPHOCYTE % 39 10 - 50 %    MONOCYTE % 9 0 - 12 %    EOSINOPHIL % 6 0 - 7 %    BASOPHIL % 1 0 - 3 %    HGB & HCT check 0.4             Radiology Studies:  CT head without IV contrast was obtained and interpreted by Radiology showing:    FINDINGS: Ventricular and sulcal patterns are symmetric. No extra-axial  fluid collection is demonstrated. No evidence of bleeding is noted. No  acute intracranial  processes are appreciated.    Bone windows:  No acute bony abnormality demonstrated.  Sinuses are unremarkable.  Mastoid air cells are unremarkable.    IMPRESSION:  NO ACUTE INTRACRANIAL PROCESS DEMONSTRATED          Emergency Department Course & Medical Decision Making:   Patient was triaged, vital signs were obtained, patient was  placed in a room.  Nurses notes reviewed.  H&P was obtained.  After examining the patient, Hep-Lock was established.  Blood was drawn for CBC, BMP, PT, PTT.  Twelve lead EKG was performed.  He was  transferred to Radiology for CT head without contrast.  CT head showed no acute intracranial abnormality.  His blood showed no significant abnormalities.  The cause for his dizziness is unclear.  He has no focal neurological findings at present time on exam.  This may be a viral labyrinthitis versus side effects to his recent start back up on his Seroquel and Paxil.  We will try the patient on Antivert to see if this does help his symptoms.  He was advised to follow up with Clayborn Heron the on-call primary care provider next week for recheck.  Call her office for an appointment time.  Return if any worsening condition P       This note was partially created using voice recognition software and is inherently subject to errors including those of syntax and "sound alike " substitutions which may escape proof reading.  In such instances, original meaning may be extrapolated by contextual derivation.         Clinical Impression:   Encounter Diagnosis   Name Primary?   . Dizziness Yes           Disposition: Discharged      Vernie Ammons, DO           Procedures

## 2019-12-18 NOTE — ED Triage Notes (Signed)
Pt presents to the ed with c/o stiff neck that started yesterday and dizziness that started this morning.

## 2019-12-18 NOTE — ED Nurses Note (Signed)
Patient discharged home with family.  AVS reviewed with patient/care giver.  A written copy of the AVS and discharge instructions was given to the patient/care giver.  Questions sufficiently answered as needed.  Patient/care giver encouraged to follow up with PCP as indicated.  In the event of an emergency, patient/care giver instructed to call 911 or go to the nearest emergency room.

## 2019-12-18 NOTE — ED Nurses Note (Addendum)
IV access attempted x2 times in right ac. 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 Dr. Leanora Cover aware.

## 2019-12-18 NOTE — Discharge Instructions (Signed)
Follow instruction sheets.  Take your medication as directed.  Drink plenty of fluids.  Return if any worsening condition.  Follow up with Clayborn Heron the on-call primary care provider next week for recheck.  Return if any worsening condition

## 2019-12-26 LAB — ECG 12 LEAD
Atrial Rate: 48 {beats}/min
Calculated P Axis: 27 degrees
Calculated T Axis: 10 degrees
PR Interval: 172 ms
QRS Duration: 90 ms
QT Interval: 488 ms
QTC Calculation: 435 ms
Ventricular rate: 48 {beats}/min

## 2020-01-15 ENCOUNTER — Encounter (INDEPENDENT_AMBULATORY_CARE_PROVIDER_SITE_OTHER): Payer: Self-pay | Admitting: Neurological Surgery

## 2020-01-25 ENCOUNTER — Other Ambulatory Visit: Payer: Self-pay

## 2020-01-25 ENCOUNTER — Emergency Department
Admission: EM | Admit: 2020-01-25 | Discharge: 2020-01-25 | Discharge status: Against Medical Advice | Payer: Medicare Other

## 2020-01-25 DIAGNOSIS — Z5321 Procedure and treatment not carried out due to patient leaving prior to being seen by health care provider: Secondary | ICD-10-CM | POA: Insufficient documentation

## 2020-04-18 ENCOUNTER — Ambulatory Visit (INDEPENDENT_AMBULATORY_CARE_PROVIDER_SITE_OTHER): Payer: Medicare Other | Admitting: Neurological Surgery

## 2020-04-18 ENCOUNTER — Other Ambulatory Visit: Payer: Self-pay

## 2020-04-18 ENCOUNTER — Encounter (INDEPENDENT_AMBULATORY_CARE_PROVIDER_SITE_OTHER): Payer: Self-pay | Admitting: Neurological Surgery

## 2020-04-18 VITALS — HR 98 | Temp 98.4°F | Wt 210.0 lb

## 2020-04-18 DIAGNOSIS — Z981 Arthrodesis status: Secondary | ICD-10-CM

## 2020-04-18 DIAGNOSIS — G8929 Other chronic pain: Secondary | ICD-10-CM

## 2020-04-18 DIAGNOSIS — M25552 Pain in left hip: Secondary | ICD-10-CM

## 2020-04-18 DIAGNOSIS — M545 Low back pain, unspecified: Secondary | ICD-10-CM

## 2020-04-18 NOTE — Progress Notes (Signed)
[name]  Outpatient/Consult     Bryan [name]  Date of Service: 04/18/2020  Referring physician: No referring provider defined for this encounter.    Gender: male  Handedness: Right handed  Marital Status: Single   Job Title (or Former Job): none    Chief Complaint:   Chief Complaint   Patient presents with   . Lower Back Pain     xray wmp. Pain in left hip   . Post Op     Lumbar fusion 03-2019     History is provided by patient    History of Present Illness:  Bryan [name] is a 44 yo male presenting for 1 year follow-up s/p L3-S1 PSF/fusion at [facility] in December 2020 for lumbar discitis. 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 He is presenting today with 2-3 months of increased low back pain and left hip pain. He denies saddle anesthesia, bowel/bladder changes, and any recent illness. He has xrays to view from [facility] today.        Past History:  Current Outpatient Medications   Medication Sig   . buprenorphine HCL (SUBUTEX) 2 mg Sublingual Tablet, Sublingual 8 mg by Sublingual route Once a day  (Patient not taking: Reported on 04/18/2020 )   . hydrOXYzine pamoate (VISTARIL) 50 mg Oral Capsule    . PARoxetine (PAXIL) 20 mg Oral Tablet Take 20 mg by mouth Every morning   . QUEtiapine (SEROQUEL XR) 150 mg Oral Tablet Sustained Release 24 hr Take 100 mg by mouth Every night     Allergies   Allergen Reactions   . Tramadol Rash     Past Medical History:   Diagnosis Date   . Anxiety          Past Surgical History:   Procedure Laterality Date   . ANKLE SURGERY     . HX HERNIA REPAIR     . HX LUMBAR SPINE SURGERY     . HX ROTATOR CUFF REPAIR Right            Family History:  Family Medical History:    None           Social History:  Social History     Socioeconomic History   . Marital status: Divorced     Spouse name: Not on file   . Number of children: Not on file   . Years of education: Not on file   . Highest education  level: Not on file   Tobacco Use   . Smoking status: Current Every Day Smoker     Packs/day: 1.00     Years: 30.00     Pack years: 30.00     Types: Cigarettes   . Smokeless tobacco: Never Used   Substance and Sexual Activity   . Alcohol use: Not Currently   . Drug use: Not Currently     Frequency: 7.0 times per week     Types: Marijuana       Review of Systems:  Other than ROS in the HPI, all other systems were negative.    Examination:  Pulse 98   Temp 36.9 C (98.4 F) (Tympanic)   Wt 95.3 kg (210 lb)   SpO2 98%   BMI 28.48 kg/m         Constitutional:   Normal    Cardiovascular:   Carotid arteries: Normal. No bruits noted.  Auscultation: NormalRRR.  Peripheral vascular system: Normal Peripheral pulses 2+ bilaterally.    Lungs:  CTA bilaterally with equal expansion.     Abdominal:  Soft and non tender to palpation.     Musculoskeletal:   Palpation: Normal No tenderness noted of cervical neck or vertebral processes.   Muscle strength (upper extremities): : Normal  Muscle strength (lower extremities): : Normal  Muscle tone (upper extremities): : Normal  Muscle tone (lower extremities): : Normal    Gait: Posture is normal. Casual gait is steady and intact. Tandem gait is normal. Heel and toe walking are normal.     Neurological:     Orientation: Normal Alert and oriented x3.     Recent and remote memory: Normal    Attention span and concentration: Normal    Speech: Normal    Fund of knowledge: Normal      Cranial Nerves:   1st: Not tested.  2nd: Normal PERRLA. No obvious visual field cuts bilaterally.   3rd,4th,6th: Normal EOM intact.   5th: Normal Light touch is intact and equal bilaterally in V1-V3.  7th: Normal Face symmetrical, no droop appreciated.  8th: Normal Hearing is grossly intact.  9th: Normal Palate symmetric, uvula elevates in the midline.  11th: Normal Shoulder shrug intact and equal bilaterally.   12th: Normal Tongue protrudes midline without fasciculations.       Motor:  Appropriate muscle bulk  and tone bilaterally in upper and lower extremities.   Strength is full bilaterally for deltoid, biceps, triceps, quadriceps, and hamstrings.     Coordination:  Rapid alternating movements of upper extremities intact bilaterally. There is no dysmetria on finger-to-nose test and heel-shin test bilaterally. No pronator drift of outstretched arms. Romberg is absent.       Sensory:  Intact to light touch and pinprick in all four extremities.       Reflexes:  Reflexes are 2+ and symmetric bilaterally at the patella, biceps, triceps, and brachioradialis.       Data reviewed:  [facility] grossly unchanged position of the screw/rod construct, with a non-displaced fx through the R S1 screw, and good fusion at the L4-5 level    Assessment/Plan:      ICD-10-CM    1. Chronic low back pain  M54.50 External Referral to Physical Therapy    G89.29    2. Left hip pain  M25.552 External Referral to Physical Therapy   3. S/P lumbar fusion  Z98.1 External Referral to Physical Therapy     Pt given script for PT andwill see him back in 4-6 weeks to assess need to obtain LMRI w/wo contrast for his LBP    All patient question's were answered and they verbalized agreement with plan of care.   Pt instructed to call/return to office or present to ED if symptoms change or worsen.     The patient was seen as a shared visit with the co-signing faculty.    Bryan Moulds, PA-C 04/18/2020, 19:33    With Dr. Noel Gerold

## 2020-04-18 NOTE — Progress Notes (Signed)
I personally saw and evaluated the patient. See mid-level's note for additional details. My findings/participation areas below.    Bryan [name] presents forfurtherpostop f/u [facility]. Hewas treated for serratiawith6 weeksof Avycaz, and tells me that he is now clean from the drug standpoint.  Bryan [name] has done well overall, but about a couple of months ago noted some L-sided lumbar pain, for which he has taken ibuprofen.Lumbar x-rays performedtoday reveal grossly unchanged position of the screw/rod construct, with a non-displaced fx through the R S1 screw, and good fusion at the L4-5 level.  I explained to Bryan [name] that the R S1 screw fx is not likely symptomatic at this time, nor does he require further surgery to address this given that he is one year out and appears to have fused well.  For his new lumbar pain, I gave him a prescription for PT, and if this does not help he may f/u with a new lumbar MRI with and without contrast.    Janeann Merl, MD

## 2020-05-27 ENCOUNTER — Encounter (INDEPENDENT_AMBULATORY_CARE_PROVIDER_SITE_OTHER): Payer: Self-pay | Admitting: PHYSICIAN ASSISTANT

## 2020-06-03 ENCOUNTER — Other Ambulatory Visit: Payer: Self-pay

## 2020-06-03 ENCOUNTER — Emergency Department
Admission: EM | Admit: 2020-06-03 | Discharge: 2020-06-03 | Discharge status: Against Medical Advice | Payer: Medicare Other

## 2020-06-03 DIAGNOSIS — Z5321 Procedure and treatment not carried out due to patient leaving prior to being seen by health care provider: Secondary | ICD-10-CM | POA: Insufficient documentation

## 2020-06-04 ENCOUNTER — Emergency Department
Admission: EM | Admit: 2020-06-04 | Discharge: 2020-06-04 | Disposition: A | Discharge status: Home / Self Care | Payer: Medicare Other | Attending: Emergency Medicine | Admitting: Emergency Medicine

## 2020-06-04 ENCOUNTER — Emergency Department (HOSPITAL_COMMUNITY): Payer: Medicare Other

## 2020-06-04 ENCOUNTER — Other Ambulatory Visit: Payer: Self-pay

## 2020-06-04 DIAGNOSIS — F17210 Nicotine dependence, cigarettes, uncomplicated: Secondary | ICD-10-CM | POA: Insufficient documentation

## 2020-06-04 DIAGNOSIS — M879 Osteonecrosis, unspecified: Secondary | ICD-10-CM | POA: Insufficient documentation

## 2020-06-04 LAB — CBC WITH DIFF
BASOPHIL %: 1 % (ref 0–3)
EOSINOPHIL %: 4 % (ref 0–7)
HCT: 46.9 % (ref 43.5–53.7)
HGB & HCT check: 2
HGB: 16.3 g/dL (ref 14.1–18.1)
LYMPHOCYTE %: 38 % (ref 10–50)
MCH: 31.3 pg — ABNORMAL HIGH (ref 27.0–31.2)
MCHC: 34.8 g/dL (ref 31.8–35.4)
MCV: 89.9 fL (ref 80.0–97.0)
MONOCYTE %: 6 % (ref 0–12)
NEUTROPHIL %: 51 % (ref 37–80)
PLATELETS: 247 10*3/uL (ref 142–424)
RBC: 5.21 10*6/uL (ref 4.69–6.13)
RDW: 13.8 % (ref 11.6–14.9)
WBC: 9.2 10*3/uL (ref 4.6–10.2)

## 2020-06-04 LAB — COMPREHENSIVE METABOLIC PANEL, NON-FASTING
ALBUMIN: 4.3 g/dL (ref 3.5–5.0)
ALKALINE PHOSPHATASE: 72 U/L (ref 45–115)
ALT (SGPT): 111 U/L — ABNORMAL HIGH (ref 10–55)
ANION GAP: 11 mmol/L (ref 4–13)
AST (SGOT): 42 U/L (ref 8–45)
BILIRUBIN TOTAL: 0.4 mg/dL (ref 0.3–1.3)
BUN/CREA RATIO: 18 (ref 6–22)
BUN: 15 mg/dL (ref 8–25)
CALCIUM: 9.6 mg/dL (ref 8.5–10.0)
CHLORIDE: 106 mmol/L (ref 96–111)
CO2 TOTAL: 23 mmol/L (ref 22–30)
CREATININE: 0.83 mg/dL (ref 0.75–1.35)
ESTIMATED GFR: >90 mL/min/BSA (ref >=60)
GLUCOSE: 97 mg/dL (ref 65–125)
POTASSIUM: 4.1 mmol/L (ref 3.5–5.1)
PROTEIN TOTAL: 8.1 g/dL (ref 6.4–8.3)
SODIUM: 140 mmol/L (ref 136–145)

## 2020-06-04 NOTE — ED Provider Notes (Signed)
Emergency Department  Physician Note  HPI - 06/04/2020    Name: Bryan [name]  Age and Gender: 45 y.o. male  Attending: Dr. Costella [name]      HPI:  Bryan [name] is a 45 y.o. male  who presents to the ED today for one week of worsening right foot pain and inability to bear weight on it. Patient has a history of discitis and required back surgery. He has a history of iv drug usage. He denies any history of trauma and cannot think of any inciting event.     History provided by: Patient    Review of Systems:    Constitutional: No fever, chills  Skin: No rashes or lesions  HENT: No sore throat, ear pain, or difficulty swallowing  Eyes: No vision changes, redness, discharge  Cardio: No chest pain, palpitations   Respiratory: No cough, wheezing or SOB  GI:  No nausea or vomiting. No diarrhea or constipation. No abdominal pain  GU:  No dysuria, hematuria, polyuria  MSK: Right foot pain  Neuro: No numbness, tingling, or weakness.  No headache  All other systems reviewed and are negative, unless commented on in the HPI.       Below information reviewed with patient:   Current Outpatient Medications   Medication Sig   . hydrOXYzine pamoate (VISTARIL) 50 mg Oral Capsule    . PARoxetine (PAXIL) 20 mg Oral Tablet Take 20 mg by mouth Every morning   . QUEtiapine (SEROQUEL XR) 150 mg Oral Tablet Sustained Release 24 hr Take 100 mg by mouth Every night       Allergies   Allergen Reactions   . Latex Rash   . Adhesive Rash   . Tramadol Rash          Past Medical History:  Past Medical History:   Diagnosis Date   . Anxiety        Past Surgical History:  Past Surgical History:   Procedure Laterality Date   . Ankle surgery     . Hx hernia repair     . Hx lumbar spine surgery     . Hx rotator cuff repair Right        Social History:  Social History     Tobacco Use   . Smoking status: Current Every Day Smoker     Packs/day: 1.00     Years: 30.00     Pack years: 30.00     Types: Cigarettes   . Smokeless tobacco: Never Used    Substance Use Topics   . Alcohol use: Not Currently   . Drug use: Not Currently     Frequency: 7.0 times per week     Types: Marijuana     Social History     Substance and Sexual Activity   Drug Use Not Currently   . Frequency: 7.0 times per week   . Types: Marijuana       Family History:  No family history on file.      Old records reviewed.      Objective:  Nursing notes reviewed    Filed Vitals:    06/04/20 1431   BP: (!) 127/98   Pulse: 86   Temp: 36.9 C (98.4 F)   SpO2: 97%       Physical Exam  Nursing note and vitals reviewed.  Vital signs reviewed as above.     Constitutional: Pt is well-developed and well-nourished.   Head: Normocephalic and atraumatic.  Eyes: Conjunctivae are normal. Pupils are equal, round, and reactive to light. EOM are intact  Neck: Soft, supple, full range of motion.  Cardiovascular: RRR.  No Murmurs/rubs/gallops. Distal pulses present and equal bilaterally.  Pulmonary/Chest: Normal BS BL with no distress. No audible wheezes or crackles are noted.  Abdominal: Soft, nontender, nondistended.  No rebound, guarding, or masses.  Musculoskeletal: Right foot is tender to palpation  Neurological: CNs 2-12 grossly intact.  No focal deficits noted.  Skin: Warm and dry. No rash or lesions  Psychiatric: Patient has a normal mood and affect.     Work-up:  Orders Placed This Encounter   . ADULT ROUTINE BLOOD CULTURE, SET OF 2 ADULT BOTTLES (BACTERIA AND YEAST)   . ADULT ROUTINE BLOOD CULTURE, SET OF 2 ADULT BOTTLES (BACTERIA AND YEAST)   . XR FOOT RIGHT   . COMPREHENSIVE METABOLIC PANEL, NON-FASTING   . CBC/DIFF   . COVID-19 - SCREENING - Admission (NON-PUI)   . CBC WITH DIFF   . INSERT & MAINTAIN PERIPHERAL IV ACCESS        Labs:  Results for orders placed or performed during the hospital encounter of 06/04/20 (from the past 24 hour(s))   CBC/DIFF    Narrative    The following orders were created for panel order CBC/DIFF.  Procedure                               Abnormality         Status                      ---------                               -----------         ------                     CBC WITH IZTI[[id number]]                Abnormal            Final result                 Please view results for these tests on the individual orders.   CBC WITH DIFF   Result Value Ref Range    WBC 9.2 4.6 - 10.2 x10^3/uL    RBC 5.21 4.69 - 6.13 x10^6/uL    HGB 16.3 14.1 - 18.1 g/dL    HCT 82.5 05.3 - 97.6 %    MCV 89.9 80.0 - 97.0 fL    MCH 31.3 (H) 27.0 - 31.2 pg    MCHC 34.8 31.8 - 35.4 g/dL    RDW 73.4 19.3 - 79.0 %    PLATELETS 247 142 - 424 x10^3/uL    NEUTROPHIL % 51 37 - 80 %    LYMPHOCYTE % 38 10 - 50 %    MONOCYTE % 6 0 - 12 %    EOSINOPHIL % 4 0 - 7 %    BASOPHIL % 1 0 - 3 %    HGB & HCT check 2        Imaging:     Results for orders placed or performed during the hospital encounter of 06/04/20 (from the past 72 hour(s))   XR FOOT RIGHT     Status: None  Narrative    Bryan [name]    RADIOLOGIST: Elna Breslow, MD    XR FOOT RIGHT performed on 06/04/2020 3:46 PM    CLINICAL HISTORY: right foot pain, not sure if injured..  C/O DORSAL AND PLANTAR FOOT PAIN NEAR 3RD METATARSAL    TECHNIQUE:  3 views of the right foot.    COMPARISON:  06/11/2008.    FINDINGS:   Bone anchors within the distal left fibula from prior orthopedic repair.  End-stage osteoarthrosis involving the tibiotalar joint. Flattening of the talar dome, could be from osteonecrosis.  Soft tissues are unremarkable.        Impression    No evidence of acute fracture. 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 New findings since 06/11/2008.           Radiologist location ID: [id number]         Abnormal Lab results:  Labs Reviewed   CBC WITH DIFF - Abnormal; Notable for the following components:       Result Value    MCH 31.3 (*)     All other components within normal limits   ADULT ROUTINE BLOOD CULTURE, SET OF 2 BOTTLES (BACTERIA AND  YEAST)   ADULT ROUTINE BLOOD CULTURE, SET OF 2 BOTTLES (BACTERIA AND YEAST)   CBC/DIFF    Narrative:     The following orders were created for panel order CBC/DIFF.                  Procedure                               Abnormality         Status                                     ---------                               -----------         ------                                     CBC WITH WFUX[[id number]]                Abnormal            Final result                                                 Please view results for these tests on the individual orders.   COMPREHENSIVE METABOLIC PANEL, NON-FASTING   [facility] [place] [facility] TESTING         Plan: Appropriate labs and imaging ordered. Medical Records reviewed.    MDM:   During the patient's stay in the emergency department, the above listed imaging and/or labs were performed to assist with medical decision making and were reviewed by myself when available for review.     ED Course as of 06/04/20 1904   Sat Jun 04, 2020  [id number] Discussed the case with Dr. Gildardo Cranker DMP who recommends that patient can follow up with him next week. Patient is feeling well and would like to go home. [CL]      ED Course User Index  [CL] Bryan Hatcher, MD       Pt remained stable throughout the emergency department course.      Consults:    Podiatry: Dr. Gildardo Cranker. Recommends outpatient followup with him next week.    Impression:   Encounter Diagnosis   Name Primary?   . Osteonecrosis (CMS HCC) Yes     Disposition:  Discharged         It was advised that the patient return to the ED with any new, concerning or worsening symptoms and follow up as directed.    The patient verbalized understanding of all instructions and had no further questions or concerns.     Follow up:   Wyn Quaker, DPM  [address] [place] [address]  [phone]    Schedule an appointment as soon as possible for a visit         Prescriptions:   New Prescriptions    No medications on file        Critical care time: None      Bryan Hatcher, MD  06/04/2020, 19:04

## 2020-06-04 NOTE — ED Triage Notes (Signed)
right foot pain that began about a week ago. denies an injury. right foot is red and slightly warm to the touch in comparison to the left foot

## 2020-06-04 NOTE — Discharge Instructions (Signed)
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 It is important to follow up with Dr. Gildardo Cranker next week. Please call for an appointment.     For pain you can take:  8am: 1000mg  of Acetaminophen  Noon: 400mg  of Ibuprofen with Food  4pm: 1000mg  of Acetaminophen  8pm: 400mg  of Ibuprofen with Food    You should continue to use a cane to walk as needed.     If your symptoms worsen or you have any other concerns then please return to the ER.

## 2020-06-07 ENCOUNTER — Other Ambulatory Visit: Payer: Self-pay

## 2020-06-07 ENCOUNTER — Encounter (INDEPENDENT_AMBULATORY_CARE_PROVIDER_SITE_OTHER): Payer: Self-pay | Admitting: PHYSICIAN ASSISTANT

## 2020-06-07 ENCOUNTER — Ambulatory Visit (INDEPENDENT_AMBULATORY_CARE_PROVIDER_SITE_OTHER): Payer: Medicare Other | Admitting: PHYSICIAN ASSISTANT

## 2020-06-07 VITALS — BP 110/76 | HR 82 | Temp 98.3°F

## 2020-06-07 DIAGNOSIS — M545 Low back pain, unspecified: Secondary | ICD-10-CM

## 2020-06-07 DIAGNOSIS — M79671 Pain in right foot: Secondary | ICD-10-CM

## 2020-06-07 DIAGNOSIS — G8929 Other chronic pain: Secondary | ICD-10-CM

## 2020-06-07 DIAGNOSIS — M25552 Pain in left hip: Secondary | ICD-10-CM

## 2020-06-07 NOTE — Progress Notes (Signed)
[place] Department of Neurosurgery  Outpatient/Consult     Bryan [name]  Date of Service: 06/07/2020  Referring physician: No referring provider defined for this encounter.    Gender: male  Handedness: Right handed  Marital Status: Single   Job Title (or Former Job): none    Chief Complaint:   Chief Complaint   Patient presents with   . Back Pain     F/u PT     History is provided by patient    History of Present Illness:  Mr. Bryan [name] presents forfollow-up of LBP after having completed PT. He reports LBP and left hip pain for several months. He is s/p  b[facility]l [facility] from history of IV drug use. He was recently in [place], but now resides at group home and is clean.Lumbar x-rays performed at last visit in [place] grossly unchanged position of the screw/rod construct, with a non-displaced fx through the R S1 screw with otherwise good fusion at the L4-5 level.He has completed 6-8 sessions of PT without significant improvement in his LBP. He denies saddle anesthesia, bowel/bladder changes, and weakness.     Pt reports he was recently in [facility] ED with inability to walk on right foot with foot pain. He had xrays performed which showed some concerns of osteonecrosis on this foot which he has previously had surgery on. He is supposed to follow-up with Dr. Gildardo Cranker for this.            Past History:  Current Outpatient Medications   Medication Sig   . hydrOXYzine pamoate (VISTARIL) 50 mg Oral Capsule    . PARoxetine (PAXIL) 20 mg Oral Tablet Take 20 mg by mouth Every morning   . QUEtiapine (SEROQUEL XR) 150 mg Oral Tablet Sustained Release 24 hr Take 100 mg by mouth Every night     Allergies   Allergen Reactions   . Latex Rash   . Adhesive Rash   . Tramadol Rash     Past Medical History:   Diagnosis Date   . Anxiety          Past Surgical History:   Procedure Laterality Date   . ANKLE SURGERY     . HX HERNIA  REPAIR     . HX LUMBAR SPINE SURGERY     . HX ROTATOR CUFF REPAIR Right            Family History:  Family Medical History:    None           Social History:  Social History     Socioeconomic History   . Marital status: Divorced   Tobacco Use   . Smoking status: Current Every Day Smoker     Packs/day: 1.00     Years: 30.00     Pack years: 30.00     Types: Cigarettes   . Smokeless tobacco: Never Used   Substance and Sexual Activity   . Alcohol use: Not Currently   . Drug use: Not Currently     Frequency: 7.0 times per week     Types: Marijuana       Review of Systems:  Other than ROS in the HPI, all other systems were negative.    Examination:  BP 110/76   Pulse 82   Temp 36.8 C (98.3 F) (Tympanic)   SpO2 97%         Constitutional:   Normal    Cardiovascular:  Carotid arteries: Normal. No bruits noted.  Auscultation: NormalRRR.  Peripheral vascular system: Normal Peripheral pulses 2+ bilaterally.    Lungs:  CTA bilaterally with equal expansion.     Abdominal:  Soft and non tender to palpation.     Musculoskeletal:   Palpation: Normal No tenderness noted of cervical neck or vertebral processes.   Muscle strength (upper extremities): : Normal  Muscle strength (lower extremities): : Normal  Muscle tone (upper extremities): : Normal  Muscle tone (lower extremities): : Normal    Gait: Posture is normal. Casual gait is steady and intact. Tandem gait is normal. Heel and toe walking are normal.     Neurological:   Orientation: Normal Alert and oriented x3.   Recent and remote memory: Normal  Attention span and concentration: Normal  Speech: Normal  Fund of knowledge: Normal    Cranial Nerves:   1st: Not tested.  2nd: Normal PERRLA. No obvious visual field cuts bilaterally.   3rd,4th,6th: Normal EOM intact.   5th: Normal Light touch is intact and equal bilaterally in V1-V3.  7th: Normal Face symmetrical, no droop appreciated.  8th: Normal Hearing is grossly intact.  9th: Normal Palate symmetric, uvula elevates in the  midline.  11th: Normal Shoulder shrug intact and equal bilaterally.   12th: Normal Tongue protrudes midline without fasciculations.     Motor: Appropriate muscle bulk and tone bilaterally in upper and lower extremities.     Coordination: Rapid alternating movements of upper extremities intact bilaterally. There is no dysmetria on finger-to-nose test and heel-shin test bilaterally. No pronator drift of outstretched arms. Romberg is absent.     Sensory: Intact to light touch and pinprick in all four extremities.     Reflexes:     Reflexes are 2+ and symmetric bilaterally at the patella, biceps, triceps, and brachioradialis.     Babinski: Negative bilaterally.     Special Tests:     Hoffman's: Negative bilaterally.       Data reviewed:  See HPI    Assessment/Plan:      ICD-10-CM    1. Chronic low back pain  M54.50     G89.29    2. Left hip pain  M25.552    3. Right foot pain  M79.671      Patient requesting to proceed with LMRI w/wo contrast- will arrnage for this to be completed at [facility] and f/u with Dr. Noel Gerold     All patient question's were answered and they verbalized agreement with plan of care.   Pt instructed to call/return to office or present to ED if symptoms change or worsen.     The patient was seen independently.    Rachel Moulds, PA-C 06/07/2020, 14:30

## 2020-06-08 ENCOUNTER — Encounter (INDEPENDENT_AMBULATORY_CARE_PROVIDER_SITE_OTHER): Payer: Self-pay | Admitting: PHYSICIAN ASSISTANT

## 2020-06-09 LAB — ADULT ROUTINE BLOOD CULTURE, SET OF 2 BOTTLES (BACTERIA AND YEAST)
BLOOD CULTURE, ROUTINE: NO GROWTH
BLOOD CULTURE, ROUTINE: NO GROWTH

## 2020-06-12 ENCOUNTER — Other Ambulatory Visit (INDEPENDENT_AMBULATORY_CARE_PROVIDER_SITE_OTHER): Payer: Self-pay | Admitting: PHYSICIAN ASSISTANT

## 2020-06-12 ENCOUNTER — Other Ambulatory Visit: Payer: Self-pay

## 2020-06-12 ENCOUNTER — Ambulatory Visit
Admission: RE | Admit: 2020-06-12 | Discharge: 2020-06-12 | Disposition: A | Discharge status: Home / Self Care | Payer: Medicare Other | Source: Ambulatory Visit | Attending: PHYSICIAN ASSISTANT | Admitting: PHYSICIAN ASSISTANT

## 2020-06-12 DIAGNOSIS — G8929 Other chronic pain: Secondary | ICD-10-CM | POA: Insufficient documentation

## 2020-06-12 DIAGNOSIS — M25552 Pain in left hip: Secondary | ICD-10-CM

## 2020-06-12 DIAGNOSIS — M545 Low back pain, unspecified: Secondary | ICD-10-CM

## 2020-06-20 ENCOUNTER — Ambulatory Visit (INDEPENDENT_AMBULATORY_CARE_PROVIDER_SITE_OTHER): Payer: Medicare Other | Admitting: Neurological Surgery

## 2020-06-20 ENCOUNTER — Encounter (INDEPENDENT_AMBULATORY_CARE_PROVIDER_SITE_OTHER): Payer: Self-pay | Admitting: Neurological Surgery

## 2020-06-20 ENCOUNTER — Ambulatory Visit (INDEPENDENT_AMBULATORY_CARE_PROVIDER_SITE_OTHER): Payer: Medicare Other | Admitting: Foot & Ankle Surgery

## 2020-06-20 ENCOUNTER — Other Ambulatory Visit: Payer: Self-pay

## 2020-06-20 ENCOUNTER — Encounter (INDEPENDENT_AMBULATORY_CARE_PROVIDER_SITE_OTHER): Payer: Self-pay | Admitting: Foot & Ankle Surgery

## 2020-06-20 VITALS — BP 122/74 | HR 83 | Temp 96.4°F

## 2020-06-20 DIAGNOSIS — M19071 Primary osteoarthritis, right ankle and foot: Secondary | ICD-10-CM

## 2020-06-20 DIAGNOSIS — G8929 Other chronic pain: Secondary | ICD-10-CM

## 2020-06-20 DIAGNOSIS — M79605 Pain in left leg: Secondary | ICD-10-CM

## 2020-06-20 DIAGNOSIS — M545 Low back pain, unspecified: Secondary | ICD-10-CM

## 2020-06-20 DIAGNOSIS — M7751 Other enthesopathy of right foot: Secondary | ICD-10-CM

## 2020-06-20 DIAGNOSIS — M79671 Pain in right foot: Secondary | ICD-10-CM

## 2020-06-20 DIAGNOSIS — M25571 Pain in right ankle and joints of right foot: Secondary | ICD-10-CM

## 2020-06-20 MED ORDER — HYDROCODONE 5 MG-ACETAMINOPHEN 325 MG TABLET
1.0000 | ORAL_TABLET | ORAL | 0 refills | Status: AC | PRN
Start: 2020-06-20 — End: 2020-06-27

## 2020-06-20 NOTE — Progress Notes (Signed)
[facility] [address]  [place] [address]            Name: Bryan [name] MRN:  [id number]   Date: 06/20/2020 Age: 45 y.o.         Chief Complaint:   Chief Complaint   Patient presents with   . Establish Care     New pt, pt c/o of b/l foot pain        Subjective  Bryan [name] is a 45 y.o. male who presents with complaint of right foot pain and swelling.  Pain started abruptly couple of weeks ago.  No trauma no change in activity or shoe gear.  Typically walks with a cane has a history of lumbar diskitis, infections secondary to a dirty tattoo needle.  Cultures growing Serratia finished antibiotic course.  Follows up with [facility] neurology.  He relates pain and swelling right midfoot.  No history of diabetes no history of gout.  History of right ankle fracture underwent open reduction internal fixation.  This from a      Objective    On physical examination Bryan [name] is seated comfortably in the examination room in no apparent distress.  Alert and oriented to time and place. He presents with a normal gait in shoe gear.  Mood and affect are normal and appropriate to situation.  He  appears well developed, well nourished, with good attention to hygiene and body habitus.  No distress    Skin:  No erythema no calor no ecchymosis  Musculoskeletal:  Passive motion of the right ankle is limited in dorsiflexion and plantar flexion.  Pain elicited per patient response.  Pain elicited with palpation, diffuse along the area of the talonavicular joint and anterior aspect of ankle.  Neurological:  Gross sensation is intact right foot  Vascular:  Pedal pulses are intact right.  Edema, mild to moderate diffuse right ankle right midfoot    Imaging Studies: 3 Views review of radiographs three views of the right foot from June 04, 2020 my interpretation is as follows:  Evidence of joint space narrowing with osteophyte formation of the tibiotalar articulation.   Sclerotic change of the talus.    Assessment/ Plan  Bryan [name] was seen today for establish care.    Diagnoses and all orders for this visit:    Arthritis of right ankle    Capsulitis of right ankle    Right ankle pain, unspecified chronicity        1. Today I explained the etiology, prognosis, and treatment options for ankle joint arthritis, capsulitis, possible avascular necrosis possible infectious process.    2. I discussed treatment options including conservative and surgical options.    3. Today I recommended ankle arthroscopy to facilitate bone debridement, culture, biopsy anterior ankle.  I discussed the risks complications benefits associated with this.  He is in agreement.  4. Order in for cam walker boot.  5. He has been taking ibuprofen and naproxen.  I advised him to stop these 5 days prior to surgery.  As an alternative I am going to write an order for hydrocodone for him.  6. Order weight-bearing radiographs of the right ankle      Bryan [name], DPM        This note was partially created using M*Modal fluency direct system (voice recognition software ) and is inherently subject to errors including those of syntax and "sound- alike" substitutions which may escape proofreading.  In such instances, , original meaning may be extrapolated by contextual derivation.

## 2020-06-20 NOTE — Progress Notes (Signed)
I personally saw and evaluated the patient. See mid-level's note for additional details. My findings/participation areas below.    Bryan [name] presents forfurtherpostop f/u from his [facility]. Hewastreated for serratiawith6 weeksof [facility].  Bryan [name] has donewell overall, but does note continued L-sided lumbar pain, and has developed significant R foot pain which is actually worse than his lumbar pain (he has undergone some PT).  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  I explained to Bryan [name] that at this point treatment of his potential foot problem takes precedence over potential further lumbar surgery, and Bryan [name] actually has an appointment later today with Dr. Gildardo Cranker.  We will see him back in 2 months to assess his progress.  We discussed that lumbar ESI would be an option to consider before further surgery, but even with these any potential infectious issue in his foot would need to be treated first.    Janeann Merl, MD

## 2020-06-20 NOTE — Progress Notes (Signed)
[place] Department of Neurosurgery  Outpatient/Consult     [name]  Date of Service: 06/20/2020  Referring physician: No referring provider defined for this encounter.    Gender: male  Handedness: Right handed  Marital Status: Single   Job Title (or Former Job): none    Chief Complaint:   Chief Complaint   Patient presents with   . Follow-up After Testing     MRI done at [facility]     History is provided by patient    History of Present Illness:  Mr. Bryan [name] presents forfollow-up of LBP with new lumbar MRI. He reports LBP and left hip pain for several months. He is s/p  b/l L3-S1 [facility] of [facility] discitis from history of IV drug use.Lumbar x-rays performed at last visit in [place] grossly unchanged position of the screw/rod construct, with a non-displaced fx through the R S1 screw with otherwise good fusion at the L4-5 level.He has completed 6-8 sessions of PT without significant improvement in his LBP. At last visit, patient complained of R foot pain he had evaluated at [facility] ED where [place] showed concerns of osteonecrosis of talar bone. He has previously had surgery on R ankle. He has plans to follow-up with Dr. Gildardo Cranker today for this.  He denies saddle anesthesia, bowel/bladder changes, and weakness. Patient has LMRI without contrast to view today.       Past History:  Current Outpatient Medications   Medication Sig   . hydrOXYzine pamoate (VISTARIL) 50 mg Oral Capsule    . PARoxetine (PAXIL) 20 mg Oral Tablet Take 20 mg by mouth Every morning   . QUEtiapine (SEROQUEL XR) 150 mg Oral Tablet Sustained Release 24 hr Take 100 mg by mouth Every night   . traZODone (DESYREL) 150 mg Oral Tablet      Allergies   Allergen Reactions   . Latex Rash   . Adhesive Rash   . Tramadol Rash   . Meloxicam      Past Medical History:   Diagnosis Date   . Anxiety    . Back pain    . COPD (chronic obstructive pulmonary disease) (CMS HCC)    .  Depression    . Osteochondrosis     right foot         Past Surgical History:   Procedure Laterality Date   . ANKLE SURGERY     . HX HERNIA REPAIR     . HX LUMBAR SPINE SURGERY     . HX ROTATOR CUFF REPAIR Right            Family History:  Family Medical History:     Problem Relation (Age of Onset)    Cancer Father            Social History:  Social History     Socioeconomic History   . Marital status: Divorced   Tobacco Use   . Smoking status: Current Every Day Smoker     Packs/day: 1.00     Years: 30.00     Pack years: 30.00     Types: Cigarettes   . Smokeless tobacco: Never Used   Substance and Sexual Activity   . Alcohol use: Not Currently   . Drug use: Not Currently     Frequency: 7.0 times per week     Types: Marijuana       Review of Systems:  Other than ROS in the HPI, all other systems were  negative.    Examination:  BP 122/74   Pulse 83   Temp 35.8 C (96.4 F) (Tympanic)   SpO2 98%         Constitutional:   Normal    Cardiovascular:   Carotid arteries: Normal. No bruits noted.  Auscultation: NormalRRR.  Peripheral vascular system: Normal Peripheral pulses 2+ bilaterally.    Lungs:  CTA bilaterally with equal expansion.     Abdominal:  Soft and non tender to palpation.     Musculoskeletal:   Palpation: Normal No tenderness noted of cervical neck or vertebral processes.   Muscle strength (upper extremities): : Normal  Muscle strength (lower extremities): : Normal  Muscle tone (upper extremities): : Normal  Muscle tone (lower extremities): : Normal    Gait: Posture is normal. Antalgic casual gait, pt uses a cane to ambulate    Neurological:   Orientation: Normal Alert and oriented x3.   Recent and remote memory: Normal  Attention span and concentration: Normal  Speech: Normal  Fund of knowledge: Normal    Cranial Nerves:   1st: Not tested.  2nd: Normal PERRLA. No obvious visual field cuts bilaterally.   3rd,4th,6th: Normal EOM intact.   5th: Normal Light touch is intact and equal bilaterally in  V1-V3.  7th: Normal Face symmetrical, no droop appreciated.  8th: Normal Hearing is grossly intact.  9th: Normal Palate symmetric, uvula elevates in the midline.  11th: Normal Shoulder shrug intact and equal bilaterally.   12th: Normal Tongue protrudes midline without fasciculations.     Motor: Appropriate muscle bulk and tone bilaterally in upper and lower extremities.     Coordination: Rapid alternating movements of upper extremities intact bilaterally. There is no dysmetria on finger-to-nose test and heel-shin test bilaterally. No pronator drift of outstretched arms. Romberg is absent.     Sensory: Intact to light touch and pinprick in all four extremities.     Reflexes:     Reflexes are 2+ and symmetric bilaterally at the patella, biceps, triceps, and brachioradialis.       Data reviewed:  See Dr. Noel Gerold note    Assessment/Plan:      ICD-10-CM    1. Chronic low back pain  M54.50     G89.29    2. Left leg pain  M79.605    3. Right foot pain  M79.671      Patient to continue evaluation of R foot.   Will see back in 2 months once this is evaluated and treated (or sooner if needed)     All patient question's were answered and they verbalized agreement with plan of care.   Pt instructed to call/return to office or present to ED if symptoms change or worsen.     The patient was seen as a shared visit with the co-signing faculty.    Rachel Moulds, PA-C 06/20/2020, 11:02    With Dr. Noel Gerold

## 2020-06-21 ENCOUNTER — Telehealth (INDEPENDENT_AMBULATORY_CARE_PROVIDER_SITE_OTHER): Payer: Self-pay | Admitting: Foot & Ankle Surgery

## 2020-06-21 NOTE — Telephone Encounter (Signed)
Patient was seen in your office yesterday and you sent in a RX for him to the [facility]  He said the pharmacy had told you that the patient was on Vivitrol (?)     He said he has not taken that RX for over 2 months.

## 2020-06-27 NOTE — Telephone Encounter (Signed)
Please inform the patient that I can write a referral to pain management if he would like

## 2020-07-28 DIAGNOSIS — M25571 Pain in right ankle and joints of right foot: Secondary | ICD-10-CM

## 2020-07-28 DIAGNOSIS — M19071 Primary osteoarthritis, right ankle and foot: Secondary | ICD-10-CM

## 2020-08-03 ENCOUNTER — Encounter (INDEPENDENT_AMBULATORY_CARE_PROVIDER_SITE_OTHER): Payer: Self-pay | Admitting: Foot & Ankle Surgery

## 2020-08-03 ENCOUNTER — Other Ambulatory Visit: Payer: Self-pay

## 2020-08-03 ENCOUNTER — Ambulatory Visit (INDEPENDENT_AMBULATORY_CARE_PROVIDER_SITE_OTHER): Payer: Medicare Other | Admitting: Foot & Ankle Surgery

## 2020-08-03 DIAGNOSIS — M25571 Pain in right ankle and joints of right foot: Secondary | ICD-10-CM

## 2020-08-03 DIAGNOSIS — Z9889 Other specified postprocedural states: Secondary | ICD-10-CM

## 2020-08-03 MED ORDER — HYDROCODONE 5 MG-ACETAMINOPHEN 325 MG TABLET
1.0000 | ORAL_TABLET | ORAL | 0 refills | Status: DC | PRN
Start: 2020-08-03 — End: 2020-08-10

## 2020-08-03 NOTE — Progress Notes (Signed)
Theressa Stamps, ST. [facility]  [phone] [place] [address]            Name: Alvie Fowles MRN:  [id number]   Date: 08/03/2020 Age: 45 y.o.         Chief Complaint:   No chief complaint on file.      Subjective  Tab Rylee is a 45 y.o. male who presents to clinic for follow-up of surgery right ankle.  He has been following instructions.  He relates still having significant pain.  He has been doing some minimal weight-bearing with aid of crutches.      Objective    On physical examination Royalty Domagala is seated comfortably in the examination room in no apparent distress.  Alert and oriented to time and place. He presents with a normal gait in shoe gear.  Mood and affect are normal and appropriate to situation.  He  appears well developed, well nourished, with good attention to hygiene and body habitus.     Skin:  Incision line with sutures intact skin margins well approximated.  No erythema no dehiscence no maceration no necrosis  Musculoskeletal:  No misalignment foot or ankle  Neurological:  Gross sensation intact  Vascular:  Pedal pulses are intact.  Mild to moderate edema over the operative site      Assessment/ Plan  Diagnoses and all orders for this visit:    Post-operative state        1. Dressing removed.  Applied bacitracin ointment and a Band-Aid to the incision lines.  Continue daily dressing changes bacitracin ointment Band-Aid to the incision lines daily.  Follow-up with me next week for suture removal  2. Review of the path report from July 28, 2020 which indicates "bone and cartilage with reactive changes".  Review of the bone culture results from July 28, 2020 no growth to date  3. We will reassess progress with surgery and consider ankle arthrodesis if needed   4. Order written for Vicodin  5. Follow-up next week for suture removal     Wyn Quaker, DPM        This note was partially created using M*Modal fluency direct system (voice  recognition software ) and is inherently subject to errors including those of syntax and "sound- alike" substitutions which may escape proofreading.  In such instances, , original meaning may be extrapolated by contextual derivation.

## 2020-08-10 ENCOUNTER — Other Ambulatory Visit: Payer: Self-pay

## 2020-08-10 ENCOUNTER — Ambulatory Visit (INDEPENDENT_AMBULATORY_CARE_PROVIDER_SITE_OTHER): Payer: Medicare Other | Admitting: Foot & Ankle Surgery

## 2020-08-10 ENCOUNTER — Encounter (INDEPENDENT_AMBULATORY_CARE_PROVIDER_SITE_OTHER): Payer: Self-pay | Admitting: Foot & Ankle Surgery

## 2020-08-10 DIAGNOSIS — Z9889 Other specified postprocedural states: Secondary | ICD-10-CM

## 2020-08-10 DIAGNOSIS — M25571 Pain in right ankle and joints of right foot: Secondary | ICD-10-CM

## 2020-08-10 MED ORDER — HYDROCODONE 5 MG-ACETAMINOPHEN 325 MG TABLET
1.0000 | ORAL_TABLET | Freq: Four times a day (QID) | ORAL | 0 refills | Status: DC | PRN
Start: 2020-08-10 — End: 2020-08-19

## 2020-08-10 NOTE — Progress Notes (Signed)
Theressa Stamps, ST. [facility]  [phone]. [place] [address]            Name: Rui Wordell MRN:  [id number]   Date: 08/10/2020 Age: 45 y.o.         Chief Complaint:   Chief Complaint   Patient presents with   . Post Op     Pt follow up right foot ankle surgery        Subjective  Gurveer Colucci is a 45 y.o. male who presents to clinic for follow-up of surgery right ankle.  He has been following instructions.  He has been doing range-of-motion exercises.  He says he is still having some pain but overall it is getting better.  He is requesting refill on pain medicine.      Objective    On physical examination Drury Ardizzone is seated comfortably in the examination room in no apparent distress.  Alert and oriented to time and place. He presents with a normal gait in shoe gear.  Mood and affect are normal and appropriate to situation.  He  appears well developed, well nourished, with good attention to hygiene and body habitus.     Skin:  Incision line with sutures intact skin margins well approximated.  No erythema no dehiscence no maceration no necrosis  Musculoskeletal:  No misalignment foot or ankle.  He is able to actively dorsiflex and plantar flex the right foot at the level of the ankle.  5/5 muscle strength for all groups tested.  Neurological:  Gross sensation intact  Vascular:  Pedal pulses are intact.  Mild to moderate edema over the operative site      Assessment/ Plan  Xaviar was seen today for post op.    Diagnoses and all orders for this visit:    Post-operative state        1. Remove sutures removed.  2. Gradual progression back to shoe gear and activity  3. Continue with range-of-motion exercises.  Demonstration given.  4. Order written for Vicodin  5. Follow-up with me in 6 weeks to check his progress.  Culture results reviewed today no growth to date.  Findings discussed with the patient.  6. We will reassess progress with surgery and consider ankle  arthrodesis if needed     Wyn Quaker, DPM        This note was partially created using M*Modal fluency direct system (voice recognition software ) and is inherently subject to errors including those of syntax and "sound- alike" substitutions which may escape proofreading.  In such instances, , original meaning may be extrapolated by contextual derivation.

## 2020-08-16 ENCOUNTER — Telehealth (INDEPENDENT_AMBULATORY_CARE_PROVIDER_SITE_OTHER): Payer: Self-pay | Admitting: Foot & Ankle Surgery

## 2020-08-16 NOTE — Telephone Encounter (Signed)
Patient ran out of pain medication and wants to know if you can call him in a refill.    Hydrocodone 5-325 mg       Preferred Pharmacy     [facility] [place] [facility] - [address]    [address] [place] [address]    Phone: [phone] Fax: [phone]    Hours: Not open 24 hours            Farrel Demark, Office Staff  08/16/2020, 13:12

## 2020-08-19 ENCOUNTER — Other Ambulatory Visit: Payer: Self-pay

## 2020-08-19 ENCOUNTER — Encounter (INDEPENDENT_AMBULATORY_CARE_PROVIDER_SITE_OTHER): Payer: Self-pay | Admitting: Neurological Surgery

## 2020-08-19 ENCOUNTER — Ambulatory Visit (INDEPENDENT_AMBULATORY_CARE_PROVIDER_SITE_OTHER): Payer: Medicare Other | Admitting: Neurological Surgery

## 2020-08-19 VITALS — BP 110/80 | HR 84 | Temp 97.0°F

## 2020-08-19 DIAGNOSIS — M5442 Lumbago with sciatica, left side: Secondary | ICD-10-CM

## 2020-08-19 DIAGNOSIS — M48062 Spinal stenosis, lumbar region with neurogenic claudication: Secondary | ICD-10-CM

## 2020-08-19 DIAGNOSIS — Z981 Arthrodesis status: Secondary | ICD-10-CM

## 2020-08-19 DIAGNOSIS — G8929 Other chronic pain: Secondary | ICD-10-CM

## 2020-08-19 MED ORDER — HYDROCODONE 5 MG-ACETAMINOPHEN 325 MG TABLET
1.0000 | ORAL_TABLET | Freq: Four times a day (QID) | ORAL | 0 refills | Status: DC | PRN
Start: 2020-08-19 — End: 2020-08-19

## 2020-08-19 NOTE — Progress Notes (Signed)
I personally saw and evaluated the patient. See mid-level's note for additional details. My findings/participation are as below.    Bryan [name] [facility] the [facility]. Hewastreated for serratiawith6 weeksof Avycaz and did well.  He presents for f/u with continued L-sided LBP that radiates towards his L buttock and L groin.  His R foot problem was addressed.  At this point we suggest lumbar ESI, which we will arrange for Bryan [name] in our pain clinic.  If these do not help, he may f/u to discuss whether to consider further lumbar surgery.    Janeann Merl, MD

## 2020-08-19 NOTE — Progress Notes (Signed)
[facility] Department of Neurosurgery  New Outpatient/Consult    Bryan [name]  Date of Service: 08/19/2020  Referring physician: No referring provider defined for this encounter.    Gender: male  Handedness: Right handed  Marital Status: Single       Chief Complaint:   Chief Complaint   Patient presents with   . Back Pain     2 month f/u surgery 2020 chronic back pain     History is provided by patient    History of Present Illness  This is a 45 year old male who presents for a follow up. 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 He recovered well and was treated for serratia with 6 weeks of Avycaz. He presents today with complaints of left sided LBP with radiation into the left groin. He states that his pain is constant and worsens with laying down, improves mildly with sitting in the reclining position. He has completed PT without relief of his symptoms. He is not regularly taking an NSAID. He is not being managed by pain management. He has recently had a right foot surgery with Dr. Gildardo Cranker and his incision is healed. He denies any changes in her bowel or bladder. He does not have any new imaing to review today. His previous imaging was performed at [facility].     Past History  Current Outpatient Medications   Medication Sig   . PARoxetine (PAXIL) 20 mg Oral Tablet Take 20 mg by mouth Every morning   . QUEtiapine (SEROQUEL XR) 150 mg Oral Tablet Sustained Release 24 hr Take 100 mg by mouth Every night   . traZODone (DESYREL) 150 mg Oral Tablet      Allergies   Allergen Reactions   . Latex Rash   . Adhesive Rash   . Tramadol Rash   . Meloxicam      Past Medical History:   Diagnosis Date   . Anxiety    . Back pain    . COPD (chronic obstructive pulmonary disease) (CMS HCC)    . Depression    . Osteochondrosis     right foot         Past Surgical History:   Procedure Laterality Date   . ANKLE SURGERY     . ANKLE SURGERY Right      cleaned out arthritis   . HX HERNIA REPAIR     . HX LUMBAR SPINE SURGERY     . HX ROTATOR CUFF REPAIR Right            Family History  Family Medical History:     Problem Relation (Age of Onset)    Cancer Father            Social History  Social History     Socioeconomic History   . Marital status: Divorced   Tobacco Use   . Smoking status: Current Every Day Smoker     Packs/day: 0.25     Years: 30.00     Pack years: 7.50     Types: Cigarettes   . Smokeless tobacco: Never Used   . Tobacco comment: smokes 3 cigarettes a day   Vaping Use   . Vaping Use: Never used   Substance and Sexual Activity   . Alcohol use: Not Currently   . Drug use: Yes     Frequency: 7.0 times per week     Types:  Marijuana       Review of Systems  Other than ROS in the HPI, all other systems were negative.    Examination  BP 110/80   Pulse 84   Temp 36.1 C (97 F) (Tympanic)   SpO2 96%         Constitutional  General appearance: Normal  Cardiovascular:   Carotid arteries: Normal. No bruits noted.  Auscultation: NormalRRR  Peripheral vascular system: Normal Peripheral pulses 2+ bilaterally  Lungs:  CTA bilaterally  Abdominal:  Soft and non tender.  Musculoskeletal  Gait and Station: slow and steady with no ataxia  Muscle strength  Arm Right Left Leg Right Left   Deltoid (shoulder abduction) 5/5 5/5 Hip Flexion 5/5 5/5   Biceps (Elbow flexion) 5/5 5/5 Knee Flexion 5/5 5/5   Triceps (Elbow extension) 5/5 5/5 Knee Extension 5/5 5/5   Grip 5/5 5/5 Foot Dorsiflexion 5/5 5/5         Foot Plantarflexion 5/5 5/5   Muscle tone (upper extremities): WNL  Muscle tone (lower extremities): WNL  Sensory: Sensory exam in the upper and lower extremities is normal  Coordination: Normal rapid alternating movements and finger to nose intact. No pronator drift.    DTR's:     Brachio-  radialis Biceps Triceps Patellar Achilles Hoffman's   Right 2+ 2+ 2+ 1+ 1+ Not present   Left 2+ 2+ 2+ 1+ 1+ Not present     Neurological  Level of consciousness: Alert and  oriented  Recent and remote memory: Good recall and able to follow commands  Attention span and concentration: Normal in conversation  Language/Speech: No aphasia or dysarthria  Fund of knowledge: Appropriate in this setting  Cranial Nerves  2nd: PERRL  3rd,4th,6th:  EOMI, no nystagmus  5th: Facial sensation intact  7th: No facial asymmetry or droop  8th: Hearing intact to finger rub  9th/ 10th: Palate symmetric  11th: Normal shoulder shrug  12th: Tongue midline  Ankle Clonus: Negative bilaterally  Babinski: Negative bilaterally    Assessment/Plan      ICD-10-CM    1. Chronic left-sided low back pain with left-sided sciatica  M54.42 PAIN CL FLUORO TRANFORAMINAL EPIDURAL LUMB/SACRAL SNGL    G89.29 TRANSFORAMINAL EPIDURAL LUM/SACRAL SINGLE (AMB ONLY)   2. S/P lumbar fusion  Z98.1 PAIN CL FLUORO TRANFORAMINAL EPIDURAL LUMB/SACRAL SNGL     TRANSFORAMINAL EPIDURAL LUM/SACRAL SINGLE (AMB ONLY)   3. Lumbar stenosis with neurogenic claudication  M48.062 PAIN CL FLUORO TRANFORAMINAL EPIDURAL LUMB/SACRAL SNGL     TRANSFORAMINAL EPIDURAL LUM/SACRAL SINGLE (AMB ONLY)       Left L2-3 TFESI    The patient was seen as a shared visit with the co-signing faculty.      Leda Quail, APRN,NP-C 08/19/2020, 09:07    With Dr. Noel Gerold

## 2020-09-01 ENCOUNTER — Ambulatory Visit (INDEPENDENT_AMBULATORY_CARE_PROVIDER_SITE_OTHER): Payer: Self-pay

## 2020-09-01 NOTE — Telephone Encounter (Addendum)
Returned call to patient who is asking for proof of his instrumentation to his lumbar spine.  He reports it sets off metal detector [place] the jail.  They require him to have proof to visit his girlfriend in jail.  Will reach out to [employer] Rep and/or surgeon to get more information.      Blima Dessert, RN  09/01/2020, 14:17        Regarding: Cohen   ----- Message from Noe Gens sent at 08/31/2020  1:24 PM EDT -----  Pt is calling asking for Noel Gerold could write a letter for him saying he has metal in his spine.

## 2020-09-09 ENCOUNTER — Other Ambulatory Visit: Payer: Self-pay

## 2020-09-09 ENCOUNTER — Emergency Department (HOSPITAL_COMMUNITY): Payer: Medicare Other

## 2020-09-09 ENCOUNTER — Emergency Department
Admission: EM | Admit: 2020-09-09 | Discharge: 2020-09-09 | Disposition: A | Discharge status: Home / Self Care | Payer: Medicare Other | Attending: Emergency Medicine | Admitting: Emergency Medicine

## 2020-09-09 DIAGNOSIS — M25471 Effusion, right ankle: Secondary | ICD-10-CM

## 2020-09-09 DIAGNOSIS — Z9889 Other specified postprocedural states: Secondary | ICD-10-CM | POA: Insufficient documentation

## 2020-09-09 DIAGNOSIS — R609 Edema, unspecified: Secondary | ICD-10-CM

## 2020-09-09 DIAGNOSIS — M7989 Other specified soft tissue disorders: Secondary | ICD-10-CM | POA: Insufficient documentation

## 2020-09-09 DIAGNOSIS — F17210 Nicotine dependence, cigarettes, uncomplicated: Secondary | ICD-10-CM | POA: Insufficient documentation

## 2020-09-09 LAB — BASIC METABOLIC PANEL
ANION GAP: 11 mmol/L (ref 4–13)
BUN/CREA RATIO: 28 — ABNORMAL HIGH (ref 6–22)
BUN: 24 mg/dL (ref 8–25)
CALCIUM: 9.1 mg/dL (ref 8.5–10.0)
CHLORIDE: 103 mmol/L (ref 96–111)
CO2 TOTAL: 28 mmol/L (ref 22–30)
CREATININE: 0.87 mg/dL (ref 0.75–1.35)
ESTIMATED GFR: >90 mL/min/BSA (ref >=60)
GLUCOSE: 94 mg/dL (ref 65–125)
POTASSIUM: 3.6 mmol/L (ref 3.5–5.1)
SODIUM: 142 mmol/L (ref 136–145)

## 2020-09-09 LAB — HEPATIC FUNCTION PANEL
ALBUMIN: 4 g/dL (ref 3.5–5.0)
ALKALINE PHOSPHATASE: 74 U/L (ref 45–115)
ALT (SGPT): 69 U/L — ABNORMAL HIGH (ref 10–55)
AST (SGOT): 47 U/L — ABNORMAL HIGH (ref 8–45)
BILIRUBIN DIRECT: 0.2 mg/dL (ref 0.1–0.4)
BILIRUBIN TOTAL: 0.5 mg/dL (ref 0.3–1.3)
PROTEIN TOTAL: 7 g/dL (ref 6.4–8.3)

## 2020-09-09 LAB — CBC WITH DIFF
BASOPHIL %: 1 % (ref 0–3)
EOSINOPHIL %: 6 % (ref 0–7)
HCT: 40.1 % — ABNORMAL LOW (ref 43.5–53.7)
HGB & HCT check: 1.6
HGB: 13.9 g/dL — ABNORMAL LOW (ref 14.1–18.1)
LYMPHOCYTE %: 38 % (ref 10–50)
MCH: 30.7 pg (ref 27.0–31.2)
MCHC: 34.6 g/dL (ref 31.8–35.4)
MCV: 88.5 fL (ref 80.0–97.0)
MONOCYTE %: 9 % (ref 0–12)
NEUTROPHIL %: 46 % (ref 37–80)
PLATELETS: 244 10*3/uL (ref 142–424)
RBC: 4.53 10*6/uL — ABNORMAL LOW (ref 4.69–6.13)
RDW: 14.1 % (ref 11.6–14.9)
WBC: 7.9 10*3/uL (ref 4.6–10.2)

## 2020-09-09 LAB — URINALYSIS, MACRO/MICRO
BILIRUBIN: NEGATIVE mg/dL
BLOOD: NEGATIVE mg/dL
GLUCOSE: NEGATIVE mg/dL
KETONES: NEGATIVE mg/dL
LEUKOCYTES: NEGATIVE WBCs/uL
NITRITE: NEGATIVE
PH: 5.5 (ref 5.0–8.5)
PROTEIN: NEGATIVE mg/dL
SPECIFIC GRAVITY: 1.025 — ABNORMAL HIGH (ref 1.005–1.020)
UROBILINOGEN: 0.2 mg/dL

## 2020-09-09 LAB — LIPASE: LIPASE: 31 U/L (ref 10–60)

## 2020-09-09 NOTE — ED Nurses Note (Signed)
Patient discharged home with family.  AVS reviewed with patient/care giver.  A written copy of the AVS and discharge instructions was given to the patient/care giver.  Questions sufficiently answered as needed.  Patient/care giver encouraged to follow up with PCP as indicated.  In the event of an emergency, patient/care giver instructed to call 911 or go to the nearest emergency room.

## 2020-09-09 NOTE — ED Nurses Note (Signed)
Pt taken to doppler via wc.

## 2020-09-09 NOTE — ED Nurses Note (Signed)
Return from doppler study via wc.

## 2020-09-09 NOTE — ED Provider Notes (Signed)
CC:  Back pain    HPI:  Bryan [name] is a 45 y.o. male   That presents with 2 complaints.  Patient is having bilateral flank pain.  Patient has a history of back surgeries and chronic back pain however he states this feels different.  Patient states that he feels like his urine stream is hard to start currently.  Patient denies any dysuria or hematuria.  Patient denies any recurrent UTIs.  The patient denies any abdominal pain.  Patient states additional symptom he is having today is swelling in his right ankle.  Patient had surgery on that recently due to some bone degradation and the swelling went down after surgery however over the last 2 days his swelling in his right ankle was increased.  Patient denies any history of DVT or PE.  Patient denies any fevers or systemic symptoms.  Patient denies any chest pain or shortness of breath.  Patient denies any change in bowel habits.  No recent traumas.    ROS: Except as in HPI  Constitutional: No fever or weakness   Skin: No rash or diaphoresis  HENT: No headaches or congestion  Eyes: No vision changes   Cardio: No chest pain, palpitations or leg swelling   Respiratory: No cough, wheezing or SOB  GI:  No nausea, vomiting, diarrhea, constipation  GU:  No dysuria, hematuria, polyuria  MSK: No joint or back pain  Neuro: No loss of sensation, confusion, focal deficits  Psychiatric: No mood changes  All other systems reviewed and are negative.    Medications:  Medications Prior to Admission     Prescriptions    PARoxetine (PAXIL) 20 mg Oral Tablet    Take 20 mg by mouth Every morning    QUEtiapine (SEROQUEL XR) 150 mg Oral Tablet Sustained Release 24 hr    Take 100 mg by mouth Every night    traZODone (DESYREL) 150 mg Oral Tablet          Allergies:  Allergies   Allergen Reactions   . Latex Rash   . Adhesive Rash   . Tramadol Rash   . Meloxicam      Allergies reviewed with patient    Past Medical History:   Diagnosis Date   . Anxiety    . Back pain    . COPD (chronic  obstructive pulmonary disease) (CMS HCC)    . Depression    . Osteochondrosis     right foot         History reviewed with patient    Past Surgical History:   Procedure Laterality Date   . ANKLE SURGERY     . ANKLE SURGERY Right     cleaned out arthritis   . HX HERNIA REPAIR     . HX LUMBAR SPINE SURGERY     . HX ROTATOR CUFF REPAIR Right            Social History     Socioeconomic History   . Marital status: Divorced     Spouse name: Not on file   . Number of children: Not on file   . Years of education: Not on file   . Highest education level: Not on file   Occupational History   . Not on file   Tobacco Use   . Smoking status: Current Every Day Smoker     Packs/day: 0.25     Years: 30.00     Pack years: 7.50     Types: Cigarettes   .  Smokeless tobacco: Never Used   . Tobacco comment: smokes 3 cigarettes a day   Vaping Use   . Vaping Use: Never used   Substance and Sexual Activity   . Alcohol use: Not Currently   . Drug use: Yes     Frequency: 7.0 times per week     Types: Marijuana   . Sexual activity: Not on file   Other Topics Concern   . Not on file   Social History Narrative   . Not on file     Social Determinants of Health     Financial Resource Strain: Not on file   Food Insecurity: Not on file   Transportation Needs: Not on file   Physical Activity: Not on file   Stress: Not on file   Intimate Partner Violence: Not on file   Housing Stability: Not on file       Family Medical History:     Problem Relation (Age of Onset)    Cancer Father            Physical Exam:  All nurse's notes reviewed.  ED Triage Vitals [09/09/20 1254]   BP (Non-Invasive) 118/75   Heart Rate 85   Respiratory Rate 18   Temperature 36.7 C (98.1 F)   SpO2 96 %   Weight 88.5 kg (195 lb)   Height 1.829 m (6')     Constitutional: NAD. Oriented x4  HENT:   Head: Normocephalic and atraumatic.   Mouth/Throat: Oropharynx is clear and moist.   Eyes: PERRL, EOMI, conjunctivae without discharge bilaterally  Neck: Trachea midline.    Cardiovascular: RRR, No murmurs, rubs or gallops.   Pulmonary/Chest: BS equal bilaterally, good air movement. No respiratory distress. No wheezes, rales or chest tenderness.   Abdominal: BS +. Abdomen soft, no tenderness, rebound or guarding.     No CVA tenderness bilaterally.            Musculoskeletal:   Swelling to the right lower extremity up to the level just above the ankle, no overlying erythema or signs of infection.  Skin: warm and dry. No rash, erythema, pallor or cyanosis  Psychiatric: normal mood and affect. Behavior is normal.   Neurological: Alert&Ox3. Grossly intact.            Labs:  Results for orders placed or performed during the hospital encounter of 09/09/20 (from the past 24 hour(s))   BASIC METABOLIC PANEL   Result Value Ref Range    SODIUM 142 136 - 145 mmol/L    POTASSIUM 3.6 3.5 - 5.1 mmol/L    CHLORIDE 103 96 - 111 mmol/L    CO2 TOTAL 28 22 - 30 mmol/L    ANION GAP 11 4 - 13 mmol/L    CALCIUM 9.1 8.5 - 10.0 mg/dL    GLUCOSE 94 65 - 500 mg/dL    BUN 24 8 - 25 mg/dL    CREATININE 9.38 1.82 - 1.35 mg/dL    BUN/CREA RATIO 28 (H) 6 - 22    ESTIMATED GFR >90 >=60 mL/min/BSA   CBC/DIFF    Narrative    The following orders were created for panel order CBC/DIFF.  Procedure                               Abnormality         Status                     ---------                               -----------         ------  CBC WITH UEAV[[id number]]                Abnormal            Final result                 Please view results for these tests on the individual orders.   HEPATIC FUNCTION PANEL   Result Value Ref Range    ALBUMIN 4.0 3.5 - 5.0 g/dL     ALKALINE PHOSPHATASE 74 45 - 115 U/L    ALT (SGPT) 69 (H) 10 - 55 U/L    AST (SGOT)  47 (H) 8 - 45 U/L    BILIRUBIN TOTAL 0.5 0.3 - 1.3 mg/dL    BILIRUBIN DIRECT 0.2 0.1 - 0.4 mg/dL    PROTEIN TOTAL 7.0 6.4 - 8.3 g/dL   LIPASE   Result Value Ref Range    LIPASE 31 10 - 60 U/L   URINALYSIS WITH REFLEX MICROSCOPIC AND CULTURE IF POSITIVE     Specimen: Urine, Clean Catch    Narrative    The following orders were created for panel order URINALYSIS WITH REFLEX MICROSCOPIC AND CULTURE IF POSITIVE.  Procedure                               Abnormality         Status                     ---------                               -----------         ------                     URINALYSIS, MACRO/MICRO[[id number]]      Abnormal            Final result                 Please view results for these tests on the individual orders.   CBC WITH DIFF   Result Value Ref Range    WBC 7.9 4.6 - 10.2 x10^3/uL    RBC 4.53 (L) 4.69 - 6.13 x10^6/uL    HGB 13.9 (L) 14.1 - 18.1 g/dL    HCT 78.240.1 (L) 95.643.5 - 53.7 %    MCV 88.5 80.0 - 97.0 fL    MCH 30.7 27.0 - 31.2 pg    MCHC 34.6 31.8 - 35.4 g/dL    RDW 21.314.1 08.611.6 - 57.814.9 %    PLATELETS 244 142 - 424 x10^3/uL    NEUTROPHIL % 46 37 - 80 %    LYMPHOCYTE % 38 10 - 50 %    MONOCYTE % 9 0 - 12 %    EOSINOPHIL % 6 0 - 7 %    BASOPHIL % 1 0 - 3 %    HGB & HCT check 1.6    URINALYSIS, MACRO/MICRO   Result Value Ref Range    COLOR Yellow Yellow    APPEARANCE Clear Clear    GLUCOSE Negative Negative mg/dL    LEUKOCYTES Negative Negative WBCs/uL    KETONES Negative Negative mg/dL    BLOOD Negative Negative mg/dL    PH 5.5 5.0 - 8.5    NITRITE Negative Negative    PROTEIN Negative Negative mg/dL    UROBILINOGEN  0.2  0.2 mg/dL    SPECIFIC GRAVITY 1.324 (H) 1.005 - 1.020    BILIRUBIN Negative Negative mg/dL       Imaging:   no DVT seen on ultrasound           Orders Placed This Encounter   . BASIC METABOLIC PANEL   . CBC/DIFF   . HEPATIC FUNCTION PANEL   . LIPASE   . URINALYSIS WITH REFLEX MICROSCOPIC AND CULTURE IF POSITIVE   . CBC WITH DIFF   . URINALYSIS, MACRO/MICRO   . PERIPHERAL VENOUS DUPLEX - LOWER       Donzetta Sprung, MD 09/09/2020, 15:54  Plan: Appropriate labs and imaging ordered. Medical Records reviewed.    Therapy/Procedures/Course/MDM:   -Patient was vitally stable throughout visit.    -Results were discussed with patient. They were  given an opportunity to ask questions.  - no sign of UTI  - labs reassuring  - back pain is likely related to patient's chronic back pain, no new focal neurologic deficits, no urinary incontinence, no urinary retention, patient denies any IV drug use currently, patient denies any fevers  - swelling to the right lower extremity likely secondary to patient's surgery, he will follow-up with his surgeon, low concern for acute infection today  Consults:  none  Impression:  Right foot swelling  Disposition:        Following the above history, physical exam, and studies, the patient was deemed stable and suitable for discharge and he will follow up as needed .  Patient was advised to return to the ED with any new, concerning or worsening symptoms and follow up as directed. The patient verbalized understanding of all instructions and had no further questions or concerns.

## 2020-09-09 NOTE — ED Triage Notes (Signed)
Patient states he is having "kidney pain" on both sides.  He states he hurts across his lower back. He has had the lower back pain for 2 days . He is also having right foot swelling.  He states he had surgery 2 months ago .  He states they "cleaned out the arthritis" .  Patient surgery was done by Dr Ulyses Southward at [facility]

## 2020-09-09 NOTE — Discharge Instructions (Signed)
Please return immediatly to the nearest ER with any new or concerning symptoms. Follow up with your primary care provider as soon as possible. Thank you for letting me be a part of your treatment team today at [facility].

## 2020-09-14 ENCOUNTER — Encounter (INDEPENDENT_AMBULATORY_CARE_PROVIDER_SITE_OTHER): Payer: Self-pay | Admitting: Foot & Ankle Surgery

## 2020-09-27 ENCOUNTER — Encounter (INDEPENDENT_AMBULATORY_CARE_PROVIDER_SITE_OTHER): Payer: Medicare Other | Admitting: Foot & Ankle Surgery

## 2020-10-17 ENCOUNTER — Other Ambulatory Visit (INDEPENDENT_AMBULATORY_CARE_PROVIDER_SITE_OTHER): Payer: Self-pay | Admitting: Family

## 2020-10-17 DIAGNOSIS — M545 Low back pain, unspecified: Secondary | ICD-10-CM

## 2021-05-19 ENCOUNTER — Encounter (INDEPENDENT_AMBULATORY_CARE_PROVIDER_SITE_OTHER): Payer: Medicare Other | Admitting: Neurological Surgery

## 2021-05-26 ENCOUNTER — Emergency Department
Admission: EM | Admit: 2021-05-26 | Discharge: 2021-05-26 | Disposition: A | Discharge status: Home / Self Care | Payer: Medicare Other | Attending: Emergency Medicine | Admitting: Emergency Medicine

## 2021-05-26 ENCOUNTER — Encounter (HOSPITAL_COMMUNITY): Payer: Self-pay

## 2021-05-26 ENCOUNTER — Other Ambulatory Visit: Payer: Self-pay

## 2021-05-26 ENCOUNTER — Emergency Department (HOSPITAL_COMMUNITY): Payer: Medicare Other

## 2021-05-26 DIAGNOSIS — F17210 Nicotine dependence, cigarettes, uncomplicated: Secondary | ICD-10-CM

## 2021-05-26 DIAGNOSIS — M85871 Other specified disorders of bone density and structure, right ankle and foot: Secondary | ICD-10-CM

## 2021-05-26 DIAGNOSIS — M85879 Other specified disorders of bone density and structure, unspecified ankle and foot: Secondary | ICD-10-CM

## 2021-05-26 DIAGNOSIS — S92309A Fracture of unspecified metatarsal bone(s), unspecified foot, initial encounter for closed fracture: Secondary | ICD-10-CM

## 2021-05-26 DIAGNOSIS — X501XXA Overexertion from prolonged static or awkward postures, initial encounter: Secondary | ICD-10-CM | POA: Insufficient documentation

## 2021-05-26 DIAGNOSIS — S93401A Sprain of unspecified ligament of right ankle, initial encounter: Secondary | ICD-10-CM | POA: Insufficient documentation

## 2021-05-26 DIAGNOSIS — S92324A Nondisplaced fracture of second metatarsal bone, right foot, initial encounter for closed fracture: Secondary | ICD-10-CM

## 2021-05-26 MED ORDER — NAPROXEN 250 MG TABLET
500.0000 mg | ORAL_TABLET | ORAL | Status: AC
Start: 2021-05-26 — End: 2021-05-26
  Administered 2021-05-26: 500 mg via ORAL
  Filled 2021-05-26: qty 2

## 2021-05-26 MED ORDER — OXYCODONE-ACETAMINOPHEN 5 MG-325 MG TABLET
1.0000 | ORAL_TABLET | ORAL | Status: AC
Start: 2021-05-26 — End: 2021-05-26
  Administered 2021-05-26: 1 via ORAL
  Filled 2021-05-26: qty 1

## 2021-05-26 NOTE — Discharge Instructions (Signed)
The x-ray did not show any new fracture.  You do have findings of previous trauma, surgery and arthritis.  You appear to have an old fracture of your 2nd metatarsal in your foot.  I recommend taking [phone] mg of Tylenol up to 4 times a day.  Additionally, you can take ibuprofen 600 mg every 8 hours with food.  If you would alternate dosing of Tylenol and ibuprofen every 4 hours, you would fall within safe limits for each medication.  Follow-up with your previous provider who did the most recent surgery.  Return to the emergency department for new numbness, worsening pain, or any other concerning symptoms.

## 2021-05-26 NOTE — ED Triage Notes (Signed)
Pt arrives to ER by private vehicle with c/o right ankle pain. Pt states he has "bone issues" and rolled his right ankle 3 days ago and believes his bone is broken again. Tylenol for pain taken at home.

## 2021-06-01 ENCOUNTER — Emergency Department (HOSPITAL_COMMUNITY): Payer: Medicare Other

## 2021-06-01 ENCOUNTER — Emergency Department
Admission: EM | Admit: 2021-06-01 | Discharge: 2021-06-01 | Disposition: A | Discharge status: Home / Self Care | Payer: Medicare Other | Attending: Student in an Organized Health Care Education/Training Program | Admitting: Student in an Organized Health Care Education/Training Program

## 2021-06-01 ENCOUNTER — Encounter (HOSPITAL_COMMUNITY): Payer: Self-pay

## 2021-06-01 ENCOUNTER — Other Ambulatory Visit: Payer: Self-pay

## 2021-06-01 DIAGNOSIS — Z8619 Personal history of other infectious and parasitic diseases: Secondary | ICD-10-CM | POA: Insufficient documentation

## 2021-06-01 DIAGNOSIS — S93402A Sprain of unspecified ligament of left ankle, initial encounter: Secondary | ICD-10-CM

## 2021-06-01 DIAGNOSIS — M8580 Other specified disorders of bone density and structure, unspecified site: Secondary | ICD-10-CM | POA: Insufficient documentation

## 2021-06-01 DIAGNOSIS — M25571 Pain in right ankle and joints of right foot: Secondary | ICD-10-CM | POA: Insufficient documentation

## 2021-06-01 DIAGNOSIS — L0390 Cellulitis, unspecified: Secondary | ICD-10-CM | POA: Insufficient documentation

## 2021-06-01 LAB — BASIC METABOLIC PANEL
ANION GAP: 12 mmol/L (ref 4–13)
BUN/CREA RATIO: 29 — ABNORMAL HIGH (ref 6–22)
BUN: 17 mg/dL (ref 8–25)
CALCIUM: 8.9 mg/dL (ref 8.5–10.0)
CHLORIDE: 101 mmol/L (ref 96–111)
CO2 TOTAL: 26 mmol/L (ref 22–30)
CREATININE: 0.59 mg/dL — ABNORMAL LOW (ref 0.75–1.35)
ESTIMATED GFR: >90 mL/min/BSA (ref >=60)
GLUCOSE: 60 mg/dL — ABNORMAL LOW (ref 65–125)
POTASSIUM: 4.4 mmol/L (ref 3.5–5.1)
SODIUM: 139 mmol/L (ref 136–145)

## 2021-06-01 LAB — CBC WITH DIFF
BASOPHIL %: 1 % (ref 0–3)
EOSINOPHIL %: 3 % (ref 0–7)
HCT: 36.9 % — ABNORMAL LOW (ref 43.5–53.7)
HGB & HCT check: 0
HGB: 12.3 g/dL — ABNORMAL LOW (ref 14.1–18.1)
LYMPHOCYTE %: 25 % (ref 10–50)
MCH: 27.5 pg (ref 27.0–31.2)
MCHC: 33.3 g/dL (ref 31.8–35.4)
MCV: 82.5 fL (ref 80.0–97.0)
MONOCYTE %: 9 % (ref 0–12)
NEUTROPHIL #: 7.8 10*3/uL
NEUTROPHIL %: 61 % (ref 37–80)
PLATELETS: 434 10*3/uL — ABNORMAL HIGH (ref 142–424)
RBC: 4.47 10*6/uL — ABNORMAL LOW (ref 4.69–6.13)
RDW: 14.2 % (ref 11.6–14.9)
WBC: 12.7 10*3/uL — ABNORMAL HIGH (ref 4.6–10.2)

## 2021-06-01 LAB — SEDIMENTATION RATE: ERYTHROCYTE SEDIMENTATION RATE (ESR): 20 mm/hr (ref 0–20)

## 2021-06-01 MED ORDER — SULFAMETHOXAZOLE 800 MG-TRIMETHOPRIM 160 MG TABLET
1.0000 | ORAL_TABLET | ORAL | Status: AC
Start: 2021-06-01 — End: 2021-06-01
  Administered 2021-06-01: 160 mg via ORAL
  Filled 2021-06-01: qty 1

## 2021-06-01 MED ORDER — CLINDAMYCIN HCL 150 MG CAPSULE
300.0000 mg | ORAL_CAPSULE | ORAL | Status: AC
Start: 2021-06-01 — End: 2021-06-01
  Administered 2021-06-01: 300 mg via ORAL
  Filled 2021-06-01: qty 2

## 2021-06-01 MED ORDER — HYDROCODONE 10 MG-ACETAMINOPHEN 325 MG TABLET
1.0000 | ORAL_TABLET | ORAL | Status: AC
Start: 2021-06-01 — End: 2021-06-01
  Administered 2021-06-01: 1 via ORAL
  Filled 2021-06-01: qty 1

## 2021-06-01 MED ORDER — LEVOFLOXACIN 500 MG/100 ML IN 5 % DEXTROSE INTRAVENOUS PIGGYBACK
500.0000 mg | INJECTION | INTRAVENOUS | Status: DC
Start: 2021-06-01 — End: 2021-06-01

## 2021-06-01 MED ORDER — HYDROCODONE 5 MG-ACETAMINOPHEN 325 MG TABLET
1.0000 | ORAL_TABLET | Freq: Four times a day (QID) | ORAL | 0 refills | Status: DC | PRN
Start: 2021-06-01 — End: 2021-08-03

## 2021-06-01 MED ORDER — HYDROCODONE-ACETAMINOPHEN 5 MG-325 MG TABLET - ED TO GO MED
1.0000 | ORAL_TABLET | ORAL | Status: AC
Start: 2021-06-01 — End: 2021-06-01
  Administered 2021-06-01: 1 via ORAL
  Filled 2021-06-01: qty 2

## 2021-06-01 MED ORDER — CLINDAMYCIN HCL 300 MG CAPSULE
300.0000 mg | ORAL_CAPSULE | Freq: Four times a day (QID) | ORAL | 0 refills | Status: AC
Start: 2021-06-01 — End: 2021-06-11

## 2021-06-01 MED ORDER — SULFAMETHOXAZOLE 800 MG-TRIMETHOPRIM 160 MG TABLET
1.0000 | ORAL_TABLET | Freq: Two times a day (BID) | ORAL | 0 refills | Status: AC
Start: 2021-06-01 — End: 2021-06-11

## 2021-06-01 NOTE — ED Provider Notes (Signed)
[facility]  ED Primary Provider Note  History of Present Illness   Chief Complaint   Patient presents with   . Ankle Pain     Bryan [name] is a 46 y.o. male who had concerns including Ankle Pain.  Arrival: The patient arrived by Car and is alone    46 year old male with past medical history of polysubstance abuse, bacteremia, spinal infection, and osteopenia presents complaining of ongoing issues with right ankle pain and swelling.  Patient was seen in the ED for the aforementioned complaint several days ago which time radiographs showed old traumatic changes as well as a subacute fracture at the base of the 2nd metatarsal.  Patient was made nonweightbearing and placed in a cam boot.  He was told to follow-up with podiatry.  Patient reports that he has not have an appointment with Podiatry until 2 weeks from now and his symptoms have not improved.  He does note that he has some redness to the lateral aspect of his ankle.  He denies any systemic symptoms.        Review of Systems   Review of Systems   Constitutional: Negative for chills, fatigue and fever.   Gastrointestinal: Negative for nausea.   Musculoskeletal: Positive for arthralgias, gait problem and joint swelling. Negative for myalgias.   Skin: Positive for color change. Negative for wound.   Neurological: Negative for dizziness and weakness.   Hematological: Negative for adenopathy.     Historical Data   History Reviewed This Encounter: Medical History  Surgical History  Family History  Social History      Physical Exam   ED Triage Vitals [06/01/21 1830]   BP (Non-Invasive) 119/66   Heart Rate 82   Respiratory Rate 16   Temperature 36.8 C (98.2 F)   SpO2 95 %   Weight 63.5 kg (140 lb)   Height 1.829 m (6')     Physical Exam  Vitals and nursing note reviewed.   Constitutional:       General: He is not in acute distress.     Appearance: Normal appearance. He is normal weight. He is not ill-appearing, toxic-appearing or  diaphoretic.   Cardiovascular:      Rate and Rhythm: Normal rate and regular rhythm.      Pulses: Normal pulses.      Heart sounds: Normal heart sounds. No murmur heard.    No friction rub. No gallop.   Pulmonary:      Effort: No respiratory distress.      Breath sounds: Normal breath sounds. No stridor. No wheezing or rales.   Abdominal:      General: Abdomen is flat. Bowel sounds are normal. There is no distension.      Palpations: Abdomen is soft.      Tenderness: There is no abdominal tenderness.   Musculoskeletal:      Comments: Right lateral ankle swelling and tenderness, erythema that is warm to the touch, no lymphangitis, no defects in the skin   Skin:     General: Skin is warm and dry.   Neurological:      Mental Status: He is alert and oriented to person, place, and time.   Psychiatric:         Mood and Affect: Mood normal.         Behavior: Behavior normal.       Patient Data   Labs Ordered/Reviewed - No data to display  No orders to display  Medical Decision Making      Medical Decision Making  Ddx and work up:  Differential includes acute fracture that was not seen on initial radiographs, lateral ankle sprain, given the patient's history of bacteremia and drug abuse osteomyelitis/infection.  Will perform infectious workup including CT of the ankle      Patient was signed out to ED evening provider pending workup.      Amount and/or Complexity of Data Reviewed  Labs: ordered. Decision-making details documented in ED Course.  Radiology: ordered. Decision-making details documented in ED Course.                  Disposition pending at the time of sign out. Care of Bryan [name] was signed out to Dr. Manya Silvas at [phone] following a discussion of the patient's course. Please refer to their Course Note for further details of the patient's ED course.         Dennard Schaumann, DO

## 2021-06-01 NOTE — Discharge Instructions (Addendum)
Your evaluated in the emergency department for right ankle pain and redness, the initial workup including CT scan and lab work including sedimentation rate are not consistent with osteomyelitis, you received treatment for cellulitis with combination antibiotics Bactrim and clindamycin, please keep your appointment with your orthopedic doctor on 06/12/2021 to decide whether not you need an MRI of your ankle if 1 was not done recently to rule out bone infection/osteomyelitis.

## 2021-06-01 NOTE — ED Provider Notes (Signed)
This is a continuation of care note please review earlier note by Dr.Rutkowski for initial evaluation management details.      Patient was evaluated in this emergency department on 05/26/2021 please review that note separately.  Patient return for continued pain redness around the right ankle, workup for osteomyelitis is so far negative with no indication of periosteal reaction on CT scan and a sed rate of 20.  White count is slightly elevated at 12,000 thousand, will treat this as possible cellulitis with Bactrim and clindamycin, patient has an appointment with his orthopedic doctor to discuss further management of his chronic ongoing right ankle problem on 06/12/2021.  Patient was advised an MRI will definitively rule out osteomyelitis prior to fusion surgery.  I will leave that decision to patient's orthopedic doctor.

## 2021-06-01 NOTE — ED Nurses Note (Signed)
Patient discharged home with family.  AVS reviewed with patient.  A written copy of the AVS and discharge instructions was given to the patient.  Questions sufficiently answered as needed.  Patient encouraged to follow up with PCP as indicated.  In the event of an emergency, patient instructed to call 911 or go to the nearest emergency room.

## 2021-06-01 NOTE — ED Triage Notes (Signed)
Pt arrives to ER with c/o right ankle pain. No known injury stated since last ER visit. Pt states ankle "is just not getting better and throbbing".

## 2021-06-02 LAB — C-REACTIVE PROTEIN(CRP),INFLAMMATION: CRP INFLAMMATION: 111.8 mg/L — ABNORMAL HIGH (ref <8.0)

## 2021-06-05 ENCOUNTER — Encounter (HOSPITAL_COMMUNITY): Payer: Self-pay

## 2021-06-05 ENCOUNTER — Emergency Department (HOSPITAL_COMMUNITY): Payer: Medicare Other

## 2021-06-05 ENCOUNTER — Other Ambulatory Visit: Payer: Self-pay

## 2021-06-05 ENCOUNTER — Emergency Department
Admission: EM | Admit: 2021-06-05 | Discharge: 2021-06-05 | Discharge status: Against Medical Advice | Payer: Medicare Other | Attending: Emergency Medicine | Admitting: Emergency Medicine

## 2021-06-05 DIAGNOSIS — Z5329 Procedure and treatment not carried out because of patient's decision for other reasons: Secondary | ICD-10-CM

## 2021-06-05 DIAGNOSIS — R7989 Other specified abnormal findings of blood chemistry: Secondary | ICD-10-CM

## 2021-06-05 DIAGNOSIS — L03115 Cellulitis of right lower limb: Secondary | ICD-10-CM | POA: Insufficient documentation

## 2021-06-05 DIAGNOSIS — Z79899 Other long term (current) drug therapy: Secondary | ICD-10-CM | POA: Insufficient documentation

## 2021-06-05 DIAGNOSIS — L0390 Cellulitis, unspecified: Secondary | ICD-10-CM

## 2021-06-05 DIAGNOSIS — F17210 Nicotine dependence, cigarettes, uncomplicated: Secondary | ICD-10-CM

## 2021-06-05 DIAGNOSIS — M79604 Pain in right leg: Secondary | ICD-10-CM

## 2021-06-05 LAB — CBC WITH DIFF
BASOPHIL %: 1 % (ref 0–3)
EOSINOPHIL %: 1 % (ref 0–7)
HCT: 31.1 % — ABNORMAL LOW (ref 43.5–53.7)
HGB & HCT check: 0.7
HGB: 10.6 g/dL — ABNORMAL LOW (ref 14.1–18.1)
LYMPHOCYTE %: 17 % (ref 10–50)
MCH: 27.3 pg (ref 27.0–31.2)
MCHC: 34 g/dL (ref 31.8–35.4)
MCV: 80.4 fL (ref 80.0–97.0)
MONOCYTE %: 7 % (ref 0–12)
NEUTROPHIL #: 9.2 10*3/uL
NEUTROPHIL %: 74 % (ref 37–80)
PLATELETS: 629 10*3/uL — ABNORMAL HIGH (ref 142–424)
RBC: 3.86 10*6/uL — ABNORMAL LOW (ref 4.69–6.13)
RDW: 14 % (ref 11.6–14.9)
WBC: 12.5 10*3/uL — ABNORMAL HIGH (ref 4.6–10.2)

## 2021-06-05 LAB — COMPREHENSIVE METABOLIC PANEL, NON-FASTING
ALBUMIN: 3 g/dL — ABNORMAL LOW (ref 3.5–5.0)
ALKALINE PHOSPHATASE: 96 U/L (ref 45–115)
ALT (SGPT): 26 U/L (ref 10–55)
ANION GAP: 11 mmol/L (ref 4–13)
AST (SGOT): 20 U/L (ref 8–45)
BILIRUBIN TOTAL: 0.3 mg/dL (ref 0.3–1.3)
BUN/CREA RATIO: 36 — ABNORMAL HIGH (ref 6–22)
BUN: 25 mg/dL (ref 8–25)
CALCIUM: 9.5 mg/dL (ref 8.5–10.0)
CHLORIDE: 95 mmol/L — ABNORMAL LOW (ref 96–111)
CO2 TOTAL: 31 mmol/L — ABNORMAL HIGH (ref 22–30)
CREATININE: 0.7 mg/dL — ABNORMAL LOW (ref 0.75–1.35)
ESTIMATED GFR: >90 mL/min/BSA (ref >=60)
GLUCOSE: 102 mg/dL (ref 65–125)
POTASSIUM: 4.2 mmol/L (ref 3.5–5.1)
PROTEIN TOTAL: 7.8 g/dL (ref 6.4–8.3)
SODIUM: 137 mmol/L (ref 136–145)

## 2021-06-05 LAB — LACTIC ACID LEVEL W/ REFLEX FOR LEVEL >2.0: LACTIC ACID: 1 mmol/L (ref 0.5–2.2)

## 2021-06-05 LAB — CREATINE KINASE (CK), TOTAL, SERUM: CREATINE KINASE: 262 U/L — ABNORMAL HIGH (ref 45–225)

## 2021-06-05 LAB — SCAN DIFFERENTIAL
PLATELET ESTIMATE: INCREASED
RBC MORPHOLOGY COMMENT: NORMAL
WBC MORPHOLOGY COMMENT: NORMAL

## 2021-06-05 LAB — SEDIMENTATION RATE: ERYTHROCYTE SEDIMENTATION RATE (ESR): 111 mm/hr — ABNORMAL HIGH (ref 0–20)

## 2021-06-05 LAB — C-REACTIVE PROTEIN(CRP),INFLAMMATION: CRP INFLAMMATION: 196.5 mg/L — ABNORMAL HIGH (ref <8.0)

## 2021-06-05 LAB — D-DIMER (TRIAGE BACK UP ONLY): D-DIMER (TRIAGE BRX/WTZ/BRN/HRS/STF): 962 ng/mL — ABNORMAL HIGH (ref 0.00–400.00)

## 2021-06-05 MED ORDER — VANCOMYCIN 1,000 MG INTRAVENOUS INJECTION
15.0000 mg/kg | INTRAVENOUS | Status: DC
Start: 2021-06-05 — End: 2021-06-05
  Administered 2021-06-05: 0 mg via INTRAVENOUS

## 2021-06-05 MED ORDER — SODIUM CHLORIDE 0.9 % IV BOLUS
1000.0000 mL | INJECTION | Status: AC
Start: 2021-06-05 — End: 2021-06-05
  Administered 2021-06-05: 1000 mL via INTRAVENOUS

## 2021-06-05 MED ORDER — CEFEPIME 2 GRAM/50 ML IN DEXTROSE 5 % INTRAVENOUS PIGGYBACK
2.0000 g | INJECTION | INTRAVENOUS | Status: DC
Start: 2021-06-05 — End: 2021-06-05
  Administered 2021-06-05: 0 g via INTRAVENOUS

## 2021-06-05 MED ORDER — KETOROLAC 30 MG/ML (1 ML) INJECTION SOLUTION
30.0000 mg | INTRAMUSCULAR | Status: AC
Start: 2021-06-05 — End: 2021-06-05
  Administered 2021-06-05: 30 mg via INTRAVENOUS
  Filled 2021-06-05: qty 1

## 2021-06-05 NOTE — Discharge Instructions (Signed)
It was recommended today that you stay in the hospital for this infection.  If you change your mind in regard to being admitted to the hospital or symptoms change or become worse or you develop fevers or other signs of systemic illness, return to the emergency department immediately.  Take the full course of antibiotics have been prescribed.

## 2021-06-05 NOTE — ED Nurses Note (Signed)
Patient discharged home with family after signing AMA form.  AVS reviewed with patient/care giver.  A written copy of the AVS and discharge instructions was given to the patient/care giver.  Questions sufficiently answered as needed.  Patient/care giver encouraged to follow up with PCP as indicated.  In the event of an emergency, patient/care giver instructed to call 911 or go to the nearest emergency room.

## 2021-06-05 NOTE — ED Provider Notes (Signed)
It was requested by the caring physician that I perform discharge instructions on this patient.  Patient does have a right lower extremity cellulitis surrounding the foot, ankle, lower leg.  He has been on outpatient antibiotics and not showing signs of improvement.  It was requested the stay in the hospital for IV antibiotics however patient adamantly refuses.  I did confirm this and he states he wants to go home against medical advice.  Patient of sound mind to make this decision.  He was made aware by being discharged against medical advice that this could be cause worsening infection, cause permanent disability, or even death.  Patient is understanding and voices this back to me.  He was instructed to continue taking the antibiotics which he has been prescribed including clindamycin and Bactrim and take the full course.  Patient is return if symptoms should change or worsen or new symptoms should develop, any signs of systemic infection should occur.  Of note, venous Doppler study was negative obtained here in the emergency department today.  Please see Dr. Artelia Laroche for the remainder details on this patient as she did perform the entirety of the medical decision making on this patient.          Procedures

## 2021-06-05 NOTE — ED Provider Notes (Signed)
Bryan [name]   18-Oct-1975   46 y.o.   male   [id number]     Chief Complaint:   Chief Complaint   Patient presents with   . Ankle Pain        HPI: This is a 46 y.o. male who presents to the emergency department for evaluation.  Patient was recently seen in the emergency department for worsening pain and swelling to his right ankle.  He was diagnosed with cellulitis and started on clindamycin and Bactrim.  Patient reports he has been taking the medication.  On the morning of presentation, patient noted increasing swelling and the redness has also increased compared to most recent ED visit.  His pain is moderately controlled with his pain medication at home.  ?   Past Medical History:   Past Medical History:   Diagnosis Date   . Anxiety    . Back pain    . COPD (chronic obstructive pulmonary disease) (CMS HCC)    . Depression    . Osteochondrosis     right foot      Past Surgical History:   Past Surgical History:   Procedure Laterality Date   . Ankle surgery     . Ankle surgery Right    . Hx hernia repair     . Hx lumbar spine surgery     . Hx rotator cuff repair Right       Social History:   Social History     Tobacco Use   . Smoking status: Every Day     Packs/day: 0.25     Years: 30.00     Pack years: 7.50     Types: Cigarettes   . Smokeless tobacco: Never   . Tobacco comments:     smokes 3 cigarettes a day   Vaping Use   . Vaping Use: Never used   Substance Use Topics   . Alcohol use: Not Currently   . Drug use: Yes     Frequency: 7.0 times per week     Types: Marijuana      Social History     Substance and Sexual Activity   Drug Use Yes   . Frequency: 7.0 times per week   . Types: Marijuana      Allergies:   Allergies   Allergen Reactions   . Latex Rash   . Adhesive Rash   . Tramadol Rash   . Meloxicam           Review of Systems   Constitutional: Positive for chills. Negative for fever.   Gastrointestinal: Negative for nausea and vomiting.   Musculoskeletal: Positive for joint pain.   Skin: Positive for rash.    Neurological: Negative for sensory change.          BP (!) 175/85   Pulse (!) 116   Temp 36.6 C (97.9 F)   Resp 14   Ht 1.829 m (6')   Wt 63.5 kg (140 lb)   SpO2 98%   BMI 18.99 kg/m        Physical Exam  Vitals and nursing note reviewed.   Constitutional:       General: He is not in acute distress.     Appearance: He is normal weight.      Comments: Patient appears uncomfortable   HENT:      Head: Normocephalic and atraumatic.      Mouth/Throat:      Mouth: Mucous membranes are moist.  Eyes:      Extraocular Movements: Extraocular movements intact.      Pupils: Pupils are equal, round, and reactive to light.   Cardiovascular:      Rate and Rhythm: Regular rhythm. Tachycardia present.      Pulses: Normal pulses.      Heart sounds: Normal heart sounds.   Pulmonary:      Effort: Pulmonary effort is normal.      Breath sounds: Normal breath sounds.   Musculoskeletal:      Cervical back: Normal range of motion.      Right lower leg: Swelling (distal) and tenderness present.      Right ankle: Swelling present. Tenderness present. Decreased range of motion. Normal pulse.      Left ankle: Normal.      Right foot: Normal capillary refill. Swelling and tenderness present. Normal pulse.   Skin:     General: Skin is warm and dry.      Capillary Refill: Capillary refill takes less than 2 seconds.      Findings: Erythema (right foot, ankle and distal tib/fib) present. No abscess or wound.   Neurological:      General: No focal deficit present.      Mental Status: He is alert and oriented to person, place, and time.      Motor: No weakness.   Psychiatric:         Mood and Affect: Mood is anxious.         Behavior: Behavior is cooperative.        Abnormal Labs Reviewed   COMPREHENSIVE METABOLIC PANEL, NON-FASTING - Abnormal; Notable for the following components:       Result Value    CHLORIDE 95 (*)     CO2 TOTAL 31 (*)     CREATININE 0.70 (*)     BUN/CREA RATIO 36 (*)     ALBUMIN 3.0 (*)     All other components  within normal limits   CREATINE KINASE (CK), TOTAL, SERUM - Abnormal; Notable for the following components:    CREATINE KINASE 262 (*)     All other components within normal limits   D-DIMER (TRIAGE) - Abnormal; Notable for the following components:    D-DIMER (TRIAGE BRX/WTZ/BRN) 962.00 (*)     All other components within normal limits   SEDIMENTATION RATE - Abnormal; Notable for the following components:    ERYTHROCYTE SEDIMENTATION RATE (ESR) 111 (*)     All other components within normal limits   CBC WITH DIFF - Abnormal; Notable for the following components:    WBC 12.5 (*)     RBC 3.86 (*)     HGB 10.6 (*)     HCT 31.1 (*)     PLATELETS 629 (*)     All other components within normal limits         ?   Medical Decision Making: Patient was triaged, vital signs were obtained, patient was placed in a room. I did examine the patient. After examining the patient, he appeared uncomfortable.  Patient did have increased swelling noted to the right distal lower leg, ankle and foot.  This is increased in size from his visit on 02/02 and despite use of antibiotics.  Patient was initially tachycardic but does appear anxious on exam.  He had a recent CT scan so that was not repeated in the emergency department.  Labs were ordered.  His white blood cell count is similar to previous at 12.5.  Lactic acid was normal.  He did have increased sedimentation rate.  D-dimer was also ordered to evaluate for possible DVT and this was elevated at 962.    Patient was given Toradol and a 1L bolus of fluids.  I discussed results of testing with patient and his mother.  I did recommend admission to the hospital for cellulitis with failure of outpatient treatment.  Patient was hesitant to stay in the hospital but was ultimately agreeable.  I spoke with Dr. Prudencio Burly who requested patient receive a CT scan of the ankle to evaluate for potential abscess due to his history of drug abuse.  I did order IV antibiotics, vancomycin and cefepime to be  given to the patient.    CT came to get patient and he was refusing the test.  He was also refusing admission.  I discussed with him my concerns and reiterated that he would benefit from staying in the hospital.  He continued to declined the admission.  He was agreeable to have the ultrasound of the leg performed to evaluate for possible DVT.  The order had been placed and patient was taken to ultrasound for the study.  Patient was signed out to Dr. Audie Box pending completion of the ultrasound.  If patient continues to decline admission, he will most likely be leaving AMA    Differential diagnosis includes but is not limited to cellulitis, necrotizing fasciitis, sepsis, osteomyelitis, DVT, septic thrombus    ?   Clinical Impression:   Clinical Impression   Pain and swelling of lower extremity, right (Primary)   Cellulitis, unspecified cellulitis site   Elevated d-dimer      ?   Disposition:  Still a patient    [place], DO             Procedures

## 2021-06-05 NOTE — ED Nurses Note (Signed)
Pt requesting to leave AMA. Patient had discussion with Dr.O'Neil  regarding the risks of leaving AMA and benefits of staying for treatment. The patient is of sound mind to make their own decisions and exhibited understanding. The patient was able to repeat back to this RN and Dr.O'Neil the risks and benefits. Encouraged patient to return immediately to the Emergency Department if they change their mind or have a change of condition. AMA paperwork signed.

## 2021-06-05 NOTE — ED Nurses Note (Signed)
Called Doppler for study per Dr. Artelia Laroche request.

## 2021-06-05 NOTE — ED Nurses Note (Signed)
Called Hospitalist Dr. Marlane Mingle per Dr. Alger Memos request, transfer call

## 2021-06-05 NOTE — ED Triage Notes (Signed)
Pt c/o right ankle swelling and pain. Pt reports he has taken antibiotics as prescribed without relief.

## 2021-06-06 LAB — ADULT ROUTINE BLOOD CULTURE, SET OF 2 BOTTLES (BACTERIA AND YEAST)
BLOOD CULTURE, ROUTINE: NO GROWTH
BLOOD CULTURE, ROUTINE: NO GROWTH

## 2021-06-10 LAB — ADULT ROUTINE BLOOD CULTURE, SET OF 2 BOTTLES (BACTERIA AND YEAST)
BLOOD CULTURE, ROUTINE: NO GROWTH
BLOOD CULTURE, ROUTINE: NO GROWTH

## 2021-06-12 ENCOUNTER — Encounter (INDEPENDENT_AMBULATORY_CARE_PROVIDER_SITE_OTHER): Payer: Self-pay | Admitting: Foot & Ankle Surgery

## 2021-06-12 ENCOUNTER — Ambulatory Visit (INDEPENDENT_AMBULATORY_CARE_PROVIDER_SITE_OTHER): Payer: Medicare Other | Admitting: Foot & Ankle Surgery

## 2021-06-12 ENCOUNTER — Other Ambulatory Visit: Payer: Self-pay

## 2021-06-12 DIAGNOSIS — M25571 Pain in right ankle and joints of right foot: Secondary | ICD-10-CM

## 2021-06-12 DIAGNOSIS — G8929 Other chronic pain: Secondary | ICD-10-CM

## 2021-06-12 DIAGNOSIS — M19071 Primary osteoarthritis, right ankle and foot: Secondary | ICD-10-CM

## 2021-06-12 DIAGNOSIS — L03115 Cellulitis of right lower limb: Secondary | ICD-10-CM

## 2021-06-12 MED ORDER — CEPHALEXIN 500 MG CAPSULE
500.0000 mg | ORAL_CAPSULE | Freq: Three times a day (TID) | ORAL | 0 refills | Status: AC
Start: 2021-06-12 — End: 2021-06-22

## 2021-06-12 MED ORDER — IBUPROFEN 800 MG TABLET
800.0000 mg | ORAL_TABLET | Freq: Three times a day (TID) | ORAL | 0 refills | Status: DC | PRN
Start: 2021-06-12 — End: 2021-08-03

## 2021-06-12 NOTE — Progress Notes (Signed)
[facility] [address]  [place] [address]            Name: Bryan [name] MRN:  [id number]   Date: 06/12/2021 Age: 46 y.o.         Chief Complaint:   Chief Complaint   Patient presents with   . Foot Pain     Pain, swelling right lower leg and foot. Previous surgery on foot 8-10 months ago per pt.       Subjective  Bryan [name] is a 46 y.o. male who presents to clinic for follow-up of painful right ankle.  Recently increased swelling prompted visit to the ER.  Had a CT scan had vein study started on p.o. Bactrim.  Still having significant pain and swelling.  He says pain with applied pressure from walking and standing      Objective    On physical examination Bryan [name] is seated comfortably in the examination room in no apparent distress.  Alert and oriented to time and place. Mood and affect are normal and appropriate to situation.  He  appears well developed, well nourished, with good attention to hygiene and body habitus.    Skin:  There is faint erythema right lower leg foot and ankle as compared to the contralateral limb.  No calor no crepitus as compared to the contralateral limb.  No open lesion no drainage  Musculoskeletal:  Passive motion right ankle limited in dorsiflexion and plantar flexion.  Pain elicited with passive motion of the ankle on the right  Neurological:  He does respond to pain stimuli right lower extremity  Vascular:  Severe edema right foot ankle and lower leg.  CFT is immediate to all toes    Imaging Studies:  Review of radiographs of the right foot in the right ankle from May 26, 2021 my interpretation is as follows:  There is a nondisplaced fracture through the proximal midportion of the 2nd metatarsal with no angular misalignment.  There is significant severe joint space narrowing at the ankle joint with osteophyte formation along the posterior margin.  Review of the CT scan from June 01, 2021 my interpretation is as follows:  Joint space narrowing of the ankle joint with subchondral cyst formation    Assessment/ Plan  Bryan [name] was seen today for foot pain.    Diagnoses and all orders for this visit:    Arthritis of right ankle  -     DME - OTHER; Future    Chronic pain of right ankle  -     DME - OTHER; Future    Cellulitis of right lower leg    Other orders  -     cephalexin (KEFLEX) 500 mg Oral Capsule; Take 1 Capsule (500 mg total) by mouth Three times a day for 10 days  -     Ibuprofen (MOTRIN) 800 mg Oral Tablet; Take 1 Tablet (800 mg total) by mouth Three times a day as needed for Pain        1. Today I explained the etiology, prognosis, and treatment options for ankle arthritis right with severe swelling and overlying cellulitis.    2. I discussed treatment options including offloading, rest, elevation, p.o. antibiotics.    3. Today I recommended additional antibiotic order written for Keflex  4. I reviewed the CT scan films with him as well as the radiographs answered all questions fully.  I discussed treatment options with him to include conservative care, brace,  custom brace, [place] brace.  I also discussed surgery with him as an option arthrodesis right ankle.  He wants to try the brace 1st.  Order written for an [place]  5. Order written for ibuprofen.  He says able to take ibuprofen no problems with this medication in the past  6. Follow-up with me in 3 weeks to check his progress.        Bryan [name], DPM        This note was partially created using M*Modal fluency direct system (voice recognition software ) and is inherently subject to errors including those of syntax and "sound- alike" substitutions which may escape proofreading.  In such instances, , original meaning may be extrapolated by contextual derivation.

## 2021-06-13 NOTE — ED Provider Notes (Signed)
Beauregard Jarrells   December 01, 1975   46 y.o.   male   [id number]     Chief Complaint:   Chief Complaint   Patient presents with   . Ankle Pain        HPI: This is a 46 y.o. male who presents to the emergency department for evaluation.  Patient arrived by private vehicle and was wearing a walking boot when he arrived.  He has a history of previous fracture and surgeries of his right ankle and foot.  Patient states that he rolled his right ankle about 3 days prior to presentation.  He is having significant pain diffusely over the foot and ankle.  He is concerned about possible fracture.  Patient did take Tylenol at home.  He has noted progressive swelling of the ankle and foot along with increasing pain.  ?   Past Medical History:   Past Medical History:   Diagnosis Date   . Anxiety    . Back pain    . COPD (chronic obstructive pulmonary disease) (CMS HCC)    . Depression    . Osteochondrosis     right foot      Past Surgical History:   Past Surgical History:   Procedure Laterality Date   . Ankle surgery     . Ankle surgery Right    . Hx hernia repair     . Hx lumbar spine surgery     . Hx rotator cuff repair Right       Social History:   Social History     Tobacco Use   . Smoking status: Every Day     Packs/day: 0.25     Years: 30.00     Pack years: 7.50     Types: Cigarettes   . Smokeless tobacco: Never   . Tobacco comments:     smokes 3 cigarettes a day   Vaping Use   . Vaping Use: Never used   Substance Use Topics   . Alcohol use: Not Currently   . Drug use: Yes     Frequency: 7.0 times per week     Types: Marijuana      Social History     Substance and Sexual Activity   Drug Use Yes   . Frequency: 7.0 times per week   . Types: Marijuana      Allergies:   Allergies   Allergen Reactions   . Latex Rash   . Adhesive Rash   . Tramadol Rash   . Meloxicam    . Vistaril [Hydroxyzine Hcl] Swelling          Review of Systems   Constitutional: Negative for chills and fever.   Musculoskeletal: Positive for joint pain. Negative for  back pain and neck pain.   Skin: Negative for rash.   Neurological: Negative for dizziness and sensory change.          BP 137/75   Pulse 100   Temp 36.8 C (98.3 F)   Resp 18   Ht 1.829 m (6')   Wt 63.5 kg (140 lb)   SpO2 100%   BMI 18.99 kg/m        Physical Exam  Vitals and nursing note reviewed.   Constitutional:       General: He is not in acute distress.     Appearance: He is normal weight.      Comments: Patient appears uncomfortable secondary to pain   HENT:  Head: Normocephalic and atraumatic.   Cardiovascular:      Rate and Rhythm: Regular rhythm. Tachycardia present.      Pulses: Normal pulses.   Pulmonary:      Effort: Pulmonary effort is normal. No respiratory distress.   Musculoskeletal:      Cervical back: Normal range of motion.      Right knee: Normal.      Left knee: Normal.      Right ankle: Swelling present. Tenderness (diffusely) present. Decreased range of motion. Anterior drawer test negative. Normal pulse.      Left ankle: Normal.      Right foot: Decreased range of motion. Normal capillary refill. Swelling and tenderness present. Normal pulse.   Skin:     General: Skin is warm and dry.      Capillary Refill: Capillary refill takes less than 2 seconds.      Findings: Erythema present. No laceration or wound.      Comments: Healed surgical scars noted on right ankle and foot   Neurological:      Mental Status: He is alert.   Psychiatric:         Behavior: Behavior is cooperative.        * XR FOOT RIGHT   Final Result   There is an incompletely united fracture at the base of the second metatarsal which is nonacute. Other findings as above.         Radiologist location ID: [id number]         XR ANKLE RIGHT   Final Result   Extensive old trauma, postoperative change and arthritic change present precluding exclusion of subtle acute bony abnormalities. Lack of significant soft tissue swelling mitigates against an acute process however.            Radiologist location ID: [id number]                ?   Medical Decision Making: Patient was triaged, vital signs were obtained, patient was placed in a room. I did examine the patient. After examining the patient, he did appear uncomfortable secondary to his symptoms.  He has obvious history of trauma and surgery to the right foot and ankle.  There was swelling noted with no fluctuance or focal fluid collection.  X-rays were obtained and were negative for acute injury but were consistent with chronic trauma.  There did appear to be a injury to the 2nd metacarpal which was not completely united and did not appear acute.  Patient was given Naprosyn initially for pain.  Prior to discharge, patient was given a Percocet for pain after a discussion of why he was on Suboxone.  Patient is not currently on this medication.  I reviewed appropriate dosing recommendations for Tylenol and ibuprofen or Naprosyn.  I recommended close follow-up with his podiatrist.  We discussed signs and symptoms to watch for return to the emergency department.  Patient's mother was present during the visit.  Patient all ready has a walking boot from previous surgeries.  ?   Clinical Impression:   Clinical Impression   Right ankle sprain (Primary)   Metatarsal bone fracture - right 2nd   Osteopenia of ankle      ?   Disposition: Discharged    Ramah Langhans E Malone-Prioleau, DO             Procedures

## 2021-07-03 ENCOUNTER — Encounter (INDEPENDENT_AMBULATORY_CARE_PROVIDER_SITE_OTHER): Payer: Self-pay | Admitting: Foot & Ankle Surgery

## 2021-07-11 ENCOUNTER — Encounter (HOSPITAL_COMMUNITY): Payer: Self-pay

## 2021-07-11 ENCOUNTER — Emergency Department
Admission: EM | Admit: 2021-07-11 | Discharge: 2021-07-12 | Discharge status: Against Medical Advice | Payer: Medicare Other | Attending: Emergency Medicine | Admitting: Emergency Medicine

## 2021-07-11 ENCOUNTER — Other Ambulatory Visit: Payer: Self-pay

## 2021-07-11 DIAGNOSIS — L03115 Cellulitis of right lower limb: Secondary | ICD-10-CM | POA: Insufficient documentation

## 2021-07-11 DIAGNOSIS — M19071 Primary osteoarthritis, right ankle and foot: Secondary | ICD-10-CM | POA: Insufficient documentation

## 2021-07-11 DIAGNOSIS — F17210 Nicotine dependence, cigarettes, uncomplicated: Secondary | ICD-10-CM

## 2021-07-11 DIAGNOSIS — M869 Osteomyelitis, unspecified: Secondary | ICD-10-CM | POA: Insufficient documentation

## 2021-07-11 DIAGNOSIS — Z5329 Procedure and treatment not carried out because of patient's decision for other reasons: Secondary | ICD-10-CM

## 2021-07-11 DIAGNOSIS — J449 Chronic obstructive pulmonary disease, unspecified: Secondary | ICD-10-CM | POA: Insufficient documentation

## 2021-07-11 DIAGNOSIS — D638 Anemia in other chronic diseases classified elsewhere: Secondary | ICD-10-CM

## 2021-07-11 LAB — CBC WITH DIFF
BASOPHIL %: 1 % (ref 0–3)
EOSINOPHIL %: 4 % (ref 0–7)
HCT: 28.1 % — ABNORMAL LOW (ref 43.5–53.7)
HGB & HCT check: 0.7
HGB: 9.6 g/dL — ABNORMAL LOW (ref 14.1–18.1)
LYMPHOCYTE %: 32 % (ref 10–50)
MCH: 26.4 pg — ABNORMAL LOW (ref 27.0–31.2)
MCHC: 34 g/dL (ref 31.8–35.4)
MCV: 77.6 fL — ABNORMAL LOW (ref 80.0–97.0)
MONOCYTE %: 10 % (ref 0–12)
NEUTROPHIL #: 4.7 10*3/uL
NEUTROPHIL %: 53 % (ref 37–80)
PLATELETS: 336 10*3/uL (ref 142–424)
RBC: 3.62 10*6/uL — ABNORMAL LOW (ref 4.69–6.13)
RDW: 15.4 % — ABNORMAL HIGH (ref 11.6–14.9)
WBC: 8.9 10*3/uL (ref 4.6–10.2)

## 2021-07-11 LAB — VENOUS BLOOD GAS
(T) PCO2: 30.3 mm/Hg — ABNORMAL HIGH (ref 23.0–29.0)
BASE EXCESS: 4.7 mmol/L — ABNORMAL HIGH (ref <=2.0)
BICARBONATE (VENOUS): 29 mmol/L — ABNORMAL HIGH (ref 22.0–26.0)
O2 SATURATION (VENOUS): 97.1 % — ABNORMAL HIGH (ref 40.0–85.0)
PCO2 (VENOUS): 42 mm/Hg (ref 41–51)
PH (VENOUS): 7.46 — ABNORMAL HIGH (ref 7.31–7.41)
PO2 (VENOUS): 88 mm/Hg — ABNORMAL HIGH (ref 35–50)
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
## 2021-07-12 ENCOUNTER — Emergency Department (HOSPITAL_COMMUNITY): Payer: Medicare Other

## 2021-07-12 DIAGNOSIS — R0602 Shortness of breath: Secondary | ICD-10-CM

## 2021-07-12 LAB — BASIC METABOLIC PANEL
ANION GAP: 9 mmol/L (ref 4–13)
BUN/CREA RATIO: 27 — ABNORMAL HIGH (ref 6–22)
BUN: 19 mg/dL (ref 8–25)
CALCIUM: 8.7 mg/dL (ref 8.5–10.0)
CHLORIDE: 99 mmol/L (ref 96–111)
CO2 TOTAL: 27 mmol/L (ref 22–30)
CREATININE: 0.71 mg/dL — ABNORMAL LOW (ref 0.75–1.35)
ESTIMATED GFR: >90 mL/min/BSA (ref >=60)
GLUCOSE: 122 mg/dL (ref 65–125)
POTASSIUM: 3.9 mmol/L (ref 3.5–5.1)
SODIUM: 135 mmol/L — ABNORMAL LOW (ref 136–145)

## 2021-07-12 LAB — HEPATIC FUNCTION PANEL
ALBUMIN: 2.6 g/dL — ABNORMAL LOW (ref 3.5–5.0)
ALKALINE PHOSPHATASE: 71 U/L (ref 45–115)
ALT (SGPT): 30 U/L (ref 10–55)
AST (SGOT): 16 U/L (ref 8–45)
BILIRUBIN DIRECT: 0.1 mg/dL (ref 0.1–0.4)
BILIRUBIN TOTAL: 0.1 mg/dL — ABNORMAL LOW (ref 0.3–1.3)
PROTEIN TOTAL: 7 g/dL (ref 6.4–8.3)

## 2021-07-12 LAB — URIC ACID: URIC ACID: 2.8 mg/dL — ABNORMAL LOW (ref 3.5–7.5)

## 2021-07-12 LAB — D-DIMER (TRIAGE): D-DIMER (TRIAGE BRX/WTZ/BRN): 157 ng/mL (ref 0.00–400.00)

## 2021-07-12 LAB — ECG 12 LEAD
Atrial Rate: 97 {beats}/min
Calculated P Axis: 52 degrees
Calculated R Axis: 5 degrees
Calculated T Axis: 20 degrees
PR Interval: 142 ms
QRS Duration: 88 ms
QT Interval: 354 ms
QTC Calculation: 449 ms
Ventricular rate: 97 {beats}/min

## 2021-07-12 LAB — C-REACTIVE PROTEIN(CRP),INFLAMMATION: CRP INFLAMMATION: 130.3 mg/L — ABNORMAL HIGH (ref <8.0)

## 2021-07-12 LAB — IRON TRANSFERRIN AND TIBC
IRON (TRANSFERRIN) SATURATION: 6 % — ABNORMAL LOW (ref 15–50)
IRON: 18 ug/dL — ABNORMAL LOW (ref 55–175)
TOTAL IRON BINDING CAPACITY: 291 ug/dL (ref 252–504)
TRANSFERRIN: 208 mg/dL (ref 180–360)

## 2021-07-12 LAB — TROPONIN-I: TROPONIN I: <7 ng/L (ref <=30)

## 2021-07-12 LAB — FERRITIN: FERRITIN: 166 ng/mL (ref 20–300)

## 2021-07-12 LAB — CREATINE KINASE (CK), TOTAL, SERUM: CREATINE KINASE: 32 U/L — ABNORMAL LOW (ref 45–225)

## 2021-07-12 LAB — PT/INR
INR: 1.01 (ref 0.00–3.50)
PROTHROMBIN TIME: 10.1 seconds (ref 10.0–11.0)

## 2021-07-12 LAB — LACTIC ACID LEVEL W/ REFLEX FOR LEVEL >2.0: LACTIC ACID: 1.7 mmol/L (ref 0.5–2.2)

## 2021-07-12 LAB — SEDIMENTATION RATE: ERYTHROCYTE SEDIMENTATION RATE (ESR): 87 mm/hr — ABNORMAL HIGH (ref 0–20)

## 2021-07-12 NOTE — ED Nurses Note (Signed)
Pt used the bathroom shortly after arrival before Dr. Had seen patient. Pt made aware of order for urine sample and verbalized understanding. Will collect urine specimen when pt provides.

## 2021-07-12 NOTE — ED Nurses Note (Signed)
Pt requesting to go home. Told patient that he will not be able to get an outpatient MRI as quickly as he would if he stays inpatient and gets it in the morning as an ED patient. Pt states he will come back to ER if need be, but does not want to stay over night. Spoke with patient about getting him a hospital bed and making him more comfortable for the night. Pt states he wants to go home. Notified provider. Pt signed AMA form and is discharged at this time.

## 2021-07-12 NOTE — Discharge Instructions (Signed)
You were evaluated in the emergency department for persistent swelling and inflammation of the right ankle as well as foot and right leg.  Your labs and right leg examination is extremely concerning for osteomyelitis in the right ankle.  Your offered an MRI of the right ankle in a few hours but you decided to leave against medical advise and possibly return tomorrow.  Antibiotic therapy depends on the diagnosis and osteomyelitis is treated very different from simple cellulitis.  Please return for your MRI as soon as possible in order to confirm the diagnosis and establish treatment.  You are at risk of losing your leg.

## 2021-07-12 NOTE — ED Nurses Note (Signed)
Attempted 3X to get PIV access on pt for CT. Unable to get PIV access. Notified provider. Provider changed CT orders and said ok to hold off on PIV now.

## 2021-07-16 LAB — ADULT ROUTINE BLOOD CULTURE, SET OF 2 BOTTLES (BACTERIA AND YEAST): BLOOD CULTURE, ROUTINE: NO GROWTH

## 2021-07-17 LAB — ADULT ROUTINE BLOOD CULTURE, SET OF 2 BOTTLES (BACTERIA AND YEAST): BLOOD CULTURE, ROUTINE: NO GROWTH

## 2021-07-26 ENCOUNTER — Encounter (HOSPITAL_COMMUNITY): Payer: Self-pay

## 2021-07-26 ENCOUNTER — Emergency Department (HOSPITAL_COMMUNITY): Payer: Medicare Other

## 2021-07-26 ENCOUNTER — Emergency Department
Admission: EM | Admit: 2021-07-26 | Discharge: 2021-07-26 | Disposition: A | Discharge status: Other Acute Inpatient Hospital | Payer: Medicare Other | Attending: Emergency Medicine | Admitting: Emergency Medicine

## 2021-07-26 ENCOUNTER — Other Ambulatory Visit: Payer: Self-pay

## 2021-07-26 DIAGNOSIS — F17210 Nicotine dependence, cigarettes, uncomplicated: Secondary | ICD-10-CM | POA: Insufficient documentation

## 2021-07-26 DIAGNOSIS — D649 Anemia, unspecified: Secondary | ICD-10-CM | POA: Insufficient documentation

## 2021-07-26 DIAGNOSIS — M868X7 Other osteomyelitis, ankle and foot: Secondary | ICD-10-CM | POA: Insufficient documentation

## 2021-07-26 DIAGNOSIS — M86071 Acute hematogenous osteomyelitis, right ankle and foot: Secondary | ICD-10-CM

## 2021-07-26 DIAGNOSIS — M7989 Other specified soft tissue disorders: Secondary | ICD-10-CM | POA: Insufficient documentation

## 2021-07-26 DIAGNOSIS — M47896 Other spondylosis, lumbar region: Secondary | ICD-10-CM | POA: Insufficient documentation

## 2021-07-26 LAB — CBC WITH DIFF
BASOPHIL %: 1 % (ref 0–3)
EOSINOPHIL %: 5 % (ref 0–7)
HCT: 31.1 % — ABNORMAL LOW (ref 43.5–53.7)
HGB & HCT check: -0.2
HGB: 10.3 g/dL — ABNORMAL LOW (ref 14.1–18.1)
LYMPHOCYTE %: 29 % (ref 10–50)
MCH: 26 pg — ABNORMAL LOW (ref 27.0–31.2)
MCHC: 33.2 g/dL (ref 31.8–35.4)
MCV: 78.4 fL — ABNORMAL LOW (ref 80.0–97.0)
MONOCYTE %: 10 % (ref 0–12)
NEUTROPHIL #: 4.7 10*3/uL
NEUTROPHIL %: 55 % (ref 37–80)
PLATELETS: 446 10*3/uL — ABNORMAL HIGH (ref 142–424)
RBC: 3.96 10*6/uL — ABNORMAL LOW (ref 4.69–6.13)
RDW: 15.4 % — ABNORMAL HIGH (ref 11.6–14.9)
WBC: 8.5 10*3/uL (ref 4.6–10.2)

## 2021-07-26 LAB — URINE DRUG SCREEN
AMPHETAMINES URINE: POSITIVE — AB
BARBITURATES URINE: NEGATIVE
BENZODIAZEPINES URINE: NEGATIVE
BUPRENORPHINE URINE: NEGATIVE
CANNABINOIDS URINE: POSITIVE — AB
COCAINE METABOLITES URINE: NEGATIVE
METHADONE URINE: NEGATIVE
METHAMPHETAMINES URINE: POSITIVE — AB
OPIATES URINE: NEGATIVE
OXYCODONE URINE: NEGATIVE
PHENCYCLIDINE: NEGATIVE
PROPOXYPHENE URINE: NEGATIVE
SPECIFIC GRAVITY: 1.02
TRICYCLIC ANTIDEPRESSANTS URINE: POSITIVE — AB

## 2021-07-26 LAB — PT/INR
INR: 1.06 (ref 0.00–3.50)
PROTHROMBIN TIME: 10.6 seconds (ref 10.0–11.0)

## 2021-07-26 LAB — COMPREHENSIVE METABOLIC PANEL, NON-FASTING
ALBUMIN: 2.8 g/dL — ABNORMAL LOW (ref 3.5–5.0)
ALKALINE PHOSPHATASE: 73 U/L (ref 45–115)
ALT (SGPT): 26 U/L (ref 10–55)
ANION GAP: 10 mmol/L (ref 4–13)
AST (SGOT): 16 U/L (ref 8–45)
BILIRUBIN TOTAL: 0.1 mg/dL — ABNORMAL LOW (ref 0.3–1.3)
BUN/CREA RATIO: 27 — ABNORMAL HIGH (ref 6–22)
BUN: 17 mg/dL (ref 8–25)
CALCIUM: 9.2 mg/dL (ref 8.5–10.0)
CHLORIDE: 98 mmol/L (ref 96–111)
CO2 TOTAL: 29 mmol/L (ref 22–30)
CREATININE: 0.63 mg/dL — ABNORMAL LOW (ref 0.75–1.35)
ESTIMATED GFR: >90 mL/min/BSA (ref >=60)
GLUCOSE: 107 mg/dL (ref 65–125)
POTASSIUM: 4.1 mmol/L (ref 3.5–5.1)
PROTEIN TOTAL: 7.7 g/dL (ref 6.4–8.3)
SODIUM: 137 mmol/L (ref 136–145)

## 2021-07-26 LAB — ECG 12 LEAD
Atrial Rate: 82 {beats}/min
Calculated P Axis: 54 degrees
Calculated R Axis: 31 degrees
Calculated T Axis: 35 degrees
PR Interval: 144 ms
QRS Duration: 100 ms
QT Interval: 394 ms
QTC Calculation: 460 ms
Ventricular rate: 82 {beats}/min

## 2021-07-26 LAB — URINALYSIS, MACRO/MICRO
BILIRUBIN: NEGATIVE mg/dL
BLOOD: NEGATIVE mg/dL
GLUCOSE: NEGATIVE mg/dL
KETONES: NEGATIVE mg/dL
LEUKOCYTES: NEGATIVE WBCs/uL
NITRITE: NEGATIVE
PH: 6.5 (ref 5.0–8.5)
PROTEIN: NEGATIVE mg/dL
SPECIFIC GRAVITY: 1.02 (ref 1.005–1.020)
UROBILINOGEN: 0.2 mg/dL

## 2021-07-26 LAB — SEDIMENTATION RATE: ERYTHROCYTE SEDIMENTATION RATE (ESR): 74 mm/hr — ABNORMAL HIGH (ref 0–20)

## 2021-07-26 LAB — C-REACTIVE PROTEIN(CRP),INFLAMMATION: CRP INFLAMMATION: 71.7 mg/L — ABNORMAL HIGH (ref <8.0)

## 2021-07-26 LAB — LACTIC ACID LEVEL W/ REFLEX FOR LEVEL >2.0: LACTIC ACID: 2.8 mmol/L — ABNORMAL HIGH (ref 0.5–2.2)

## 2021-07-26 LAB — LACTIC ACID TIMED: LACTIC ACID: 0.9 mmol/L (ref 0.5–2.2)

## 2021-07-26 LAB — TROPONIN-I: TROPONIN I: <7 ng/L (ref <=30)

## 2021-07-26 MED ORDER — VANCOMYCIN 1.5 GRAM INTRAVENOUS SOLUTION
20.0000 mg/kg | INTRAVENOUS | Status: AC
Start: 2021-07-26 — End: 2021-07-26
  Administered 2021-07-26: 21:00:00 0 mg via INTRAVENOUS
  Administered 2021-07-26: 19:00:00 1500 mg via INTRAVENOUS
  Filled 2021-07-26: qty 15

## 2021-07-26 MED ORDER — GADOTERIDOL 279.3 MG/ML INTRAVENOUS SOLUTION
15.0000 mL | INTRAVENOUS | Status: AC
Start: 2021-07-26 — End: 2021-07-26
  Administered 2021-07-26: 15:00:00 15 mL via INTRAVENOUS

## 2021-07-26 MED ORDER — SODIUM CHLORIDE 0.9 % IV BOLUS
1000.0000 mL | INJECTION | Status: AC
Start: 2021-07-26 — End: 2021-07-26
  Administered 2021-07-26: 1000 mL via INTRAVENOUS
  Administered 2021-07-26: 20:00:00 0 mL via INTRAVENOUS

## 2021-07-26 MED ORDER — CEFEPIME 2 GRAM/50 ML IN DEXTROSE 5 % INTRAVENOUS PIGGYBACK
2.0000 g | INJECTION | INTRAVENOUS | Status: AC
Start: 2021-07-26 — End: 2021-07-26
  Administered 2021-07-26: 19:00:00 0 g via INTRAVENOUS
  Administered 2021-07-26: 18:00:00 2 g via INTRAVENOUS
  Filled 2021-07-26: qty 50

## 2021-07-26 NOTE — ED Nurses Note (Signed)
Report called to Lucy at [facility] for bed # 250.  Appropriate documents sent with transferring unit.

## 2021-07-26 NOTE — ED Nurses Note (Signed)
ERIC FROM [facility] CALLED WITH PT BED.  PT GOING TO 240 WITH REPORT [id number].  PT ACCEPTED DR. WALTZ.     PT BED NOT READY TILL 2300.

## 2021-07-26 NOTE — ED Triage Notes (Signed)
PT. C/O RIGHT LOWER LEG PAIN AND SWELLING. PT.  SCENE HERE RECENTLY AND TOLD He COULD HAVE OSTEOMYELITIS. PT. SIGNED OUT AMA AND WOULD NOT STAT FOR A MRI. RIGHT LEG IS RED, SWOLLEN, WARM TO TOUCH.

## 2021-07-26 NOTE — ED Nurses Note (Signed)
Urinal given to patient.

## 2021-07-26 NOTE — ED Provider Notes (Signed)
Bryan [name]  [birth year]  46 y.o.  male    Chief Complaint:   Chief Complaint   Patient presents with   . Leg Pain   . Leg Swelling       HPI: This is a 46 y.o. male who presents to the emergency department with chief complaint of right lower leg pain and swelling.  Patient was recently seen in signed out AMA did not want wait for MRI.  States that increasing swelling pain to the lower extremity.  Says he has a history of an abscess in his back.  He he has also had some increasing back pain.  States he has had with home          Past Medical History:   Past Medical History:   Diagnosis Date   . Anxiety    . Back pain    . COPD (chronic obstructive pulmonary disease) (CMS HCC)    . Depression    . Osteochondrosis     right foot       Past Surgical History:   Past Surgical History:   Procedure Laterality Date   . Ankle surgery     . Ankle surgery Right    . Hx hernia repair     . Hx lumbar spine surgery     . Hx rotator cuff repair Right        Social History:   Social History     Tobacco Use   . Smoking status: Every Day     Packs/day: 0.25     Years: 30.00     Pack years: 7.50     Types: Cigarettes   . Smokeless tobacco: Never   . Tobacco comments:     smokes 3 cigarettes a day   Vaping Use   . Vaping Use: Never used   Substance Use Topics   . Alcohol use: Not Currently   . Drug use: Yes     Frequency: 7.0 times per week     Types: Marijuana      Social History     Substance and Sexual Activity   Drug Use Yes   . Frequency: 7.0 times per week   . Types: Marijuana       Allergies:   Allergies   Allergen Reactions   . Latex Rash   . Adhesive Rash   . Tramadol Rash   . Meloxicam    . Vistaril [Hydroxyzine Hcl] Swelling         Review of Systems: Other than pertinent positives discussed in HPI, all other systems reviewed and are negative.      Physical Exam    General:  Patient alert and oriented x4.  No acute distress.  BP 115/73   Pulse 67   Temp 37.1 C (98.7 F)   Resp 12   Ht 1.829 m (6')   Wt 72.6 kg (160  lb)   SpO2 93%   BMI 21.70 kg/m         HEENT:  Head:  Normocephalic, atraumatic.  Eyes: Normal conjunctiva. Oropharynx: Oral mucosa is moist.     Neck: Supple.  No lymphadenopathy.    Respiratory:  Chest clear to auscultation bilaterally. No rales, rhonchi, or wheezes.  No respiratory distress.     Cardiovascular:  Heart regular rate and rhythm without murmurs, rubs, or gallops.      Abdomen: Soft, non-tender, bowel sounds positive.      Extremities:  Normal pulses. No clubbing, cyanosis, right  lower extremity has +3 edema with erythema to the dorsum the foot    MS:  No ROM deficit.  Normal strength.     Neuro: Alert and oriented x4.  No focal motor or sensory deficits.      Skin:  Warm and dry. No rashes or lesions.        Medical Decision Making:   Patient was triaged, vital signs were obtained, patient was  placed in a room.  I did examine the patient.  After examining the patient had IV established labs were obtained.  The patient's labs showed a CBC with the anemia with hemoglobin 10.3 matter crit 31.1 which is near baseline for patient patient does not have any leukocytosis metabolic panel shows no significant electrolyte abnormalities lactic acid 2.8 sed rate was increased to 74 troponin I less than 7 urine drug screen positive for amphetamines cannabinoids and tricyclics urinalysis shows no signs urinary tract infection.  Chest x-ray shows no acute pulmonary process.  MRI lumbar spine showed degenerative changes hardware in good location no signs of abscess formation MRI of the ankle shows cellulitis significant swelling and possible osteomyelitis in the calcaneus, talus and navicular bone.  Patient was started on cefepime and vancomycin given 1 L fluid bolus.  I did contact [place] [facility] call for transfer and they advised patient could be except at [facility].  I then spoke to the hospitalist at [facility] and patient is accepted to Dr. Caprice Beaver is service.             Clinical  Impression:   Clinical Impression   None     Osteomyelitis of the right foot      Disposition:  Transfer             Procedures

## 2021-07-26 NOTE — ED Nurses Note (Signed)
Report given to nightshift RN

## 2021-07-26 NOTE — ED Nurses Note (Signed)
[facility] on follow up with Transfer request, spoke with Lequita Halt she said they don't have any beds available, she send TX to [facility]. Waiting on them to call back

## 2021-07-26 NOTE — ED Attending Note (Addendum)
BLS TONES TO [facility]      [address] [place] DISPATCH TONED  [address] Bryan [name] FROM [facility] CALLED AND ACCEPTED TX AS SOON AS THE OTHER CREW GET BACK.

## 2021-07-27 ENCOUNTER — Inpatient Hospital Stay
Admission: AD | Admit: 2021-07-27 | Discharge: 2021-08-03 | DRG: 540 | Disposition: A | Discharge status: Skilled Nursing Facility | Payer: Medicare Other | Source: Other Acute Inpatient Hospital | Attending: Internal Medicine | Admitting: Internal Medicine

## 2021-07-27 ENCOUNTER — Inpatient Hospital Stay (HOSPITAL_COMMUNITY): Payer: Medicare Other | Admitting: Internal Medicine

## 2021-07-27 ENCOUNTER — Encounter (HOSPITAL_COMMUNITY): Payer: Self-pay | Admitting: Internal Medicine

## 2021-07-27 ENCOUNTER — Inpatient Hospital Stay (HOSPITAL_COMMUNITY): Payer: Medicare Other

## 2021-07-27 DIAGNOSIS — B1920 Unspecified viral hepatitis C without hepatic coma: Secondary | ICD-10-CM | POA: Diagnosis present

## 2021-07-27 DIAGNOSIS — M009 Pyogenic arthritis, unspecified: Secondary | ICD-10-CM | POA: Diagnosis present

## 2021-07-27 DIAGNOSIS — F319 Bipolar disorder, unspecified: Secondary | ICD-10-CM | POA: Diagnosis present

## 2021-07-27 DIAGNOSIS — R700 Elevated erythrocyte sedimentation rate: Secondary | ICD-10-CM | POA: Diagnosis present

## 2021-07-27 DIAGNOSIS — F419 Anxiety disorder, unspecified: Secondary | ICD-10-CM | POA: Diagnosis present

## 2021-07-27 DIAGNOSIS — F418 Other specified anxiety disorders: Secondary | ICD-10-CM

## 2021-07-27 DIAGNOSIS — F4310 Post-traumatic stress disorder, unspecified: Secondary | ICD-10-CM | POA: Diagnosis present

## 2021-07-27 DIAGNOSIS — L03115 Cellulitis of right lower limb: Secondary | ICD-10-CM

## 2021-07-27 DIAGNOSIS — Z79899 Other long term (current) drug therapy: Secondary | ICD-10-CM

## 2021-07-27 DIAGNOSIS — J449 Chronic obstructive pulmonary disease, unspecified: Secondary | ICD-10-CM

## 2021-07-27 DIAGNOSIS — F17210 Nicotine dependence, cigarettes, uncomplicated: Secondary | ICD-10-CM | POA: Diagnosis present

## 2021-07-27 DIAGNOSIS — Z885 Allergy status to narcotic agent status: Secondary | ICD-10-CM

## 2021-07-27 DIAGNOSIS — F17200 Nicotine dependence, unspecified, uncomplicated: Secondary | ICD-10-CM | POA: Insufficient documentation

## 2021-07-27 DIAGNOSIS — Z791 Long term (current) use of non-steroidal anti-inflammatories (NSAID): Secondary | ICD-10-CM

## 2021-07-27 DIAGNOSIS — F840 Autistic disorder: Secondary | ICD-10-CM | POA: Diagnosis present

## 2021-07-27 DIAGNOSIS — M19071 Primary osteoarthritis, right ankle and foot: Secondary | ICD-10-CM

## 2021-07-27 DIAGNOSIS — R079 Chest pain, unspecified: Secondary | ICD-10-CM

## 2021-07-27 DIAGNOSIS — M14671 Charcot's joint, right ankle and foot: Secondary | ICD-10-CM | POA: Diagnosis present

## 2021-07-27 DIAGNOSIS — M869 Osteomyelitis, unspecified: Principal | ICD-10-CM

## 2021-07-27 DIAGNOSIS — F603 Borderline personality disorder: Secondary | ICD-10-CM | POA: Diagnosis present

## 2021-07-27 DIAGNOSIS — Z20822 Contact with and (suspected) exposure to covid-19: Secondary | ICD-10-CM | POA: Diagnosis present

## 2021-07-27 DIAGNOSIS — D649 Anemia, unspecified: Secondary | ICD-10-CM | POA: Diagnosis present

## 2021-07-27 DIAGNOSIS — M86171 Other acute osteomyelitis, right ankle and foot: Principal | ICD-10-CM | POA: Diagnosis present

## 2021-07-27 DIAGNOSIS — Z888 Allergy status to other drugs, medicaments and biological substances status: Secondary | ICD-10-CM

## 2021-07-27 LAB — COMPREHENSIVE METABOLIC PANEL, NON-FASTING
ALBUMIN: 2.6 g/dL — ABNORMAL LOW (ref 3.5–5.0)
ALKALINE PHOSPHATASE: 77 U/L (ref 45–115)
ALT (SGPT): 29 U/L (ref 10–55)
ANION GAP: 7 mmol/L (ref 4–13)
AST (SGOT): 18 U/L (ref 8–45)
BILIRUBIN TOTAL: 0.1 mg/dL — ABNORMAL LOW (ref 0.3–1.3)
BUN/CREA RATIO: 20 (ref 6–22)
BUN: 13 mg/dL (ref 8–25)
CALCIUM: 8.9 mg/dL (ref 8.5–10.0)
CHLORIDE: 100 mmol/L (ref 96–111)
CO2 TOTAL: 29 mmol/L (ref 22–30)
CREATININE: 0.65 mg/dL — ABNORMAL LOW (ref 0.75–1.35)
ESTIMATED GFR: >90 mL/min/BSA (ref >=60)
GLUCOSE: 81 mg/dL (ref 65–125)
POTASSIUM: 3.7 mmol/L (ref 3.5–5.1)
PROTEIN TOTAL: 7.3 g/dL (ref 6.4–8.3)
SODIUM: 136 mmol/L (ref 136–145)

## 2021-07-27 LAB — CBC WITH DIFF
BASOPHIL #: 0.1 10*3/uL (ref 0.00–0.30)
BASOPHIL %: 1 % (ref 0–1)
EOSINOPHIL #: 0.3 10*3/uL (ref 0.00–0.50)
EOSINOPHIL %: 5 % — ABNORMAL HIGH (ref 0–4)
HCT: 34.8 % — ABNORMAL LOW (ref 42.0–52.0)
HGB: 11.4 g/dL — ABNORMAL LOW (ref 13.5–18.0)
LYMPHOCYTE #: 2.4 10*3/uL (ref 0.90–4.80)
LYMPHOCYTE %: 34 % (ref 23–35)
MCH: 25.5 pg — ABNORMAL LOW (ref 28.0–33.0)
MCHC: 32.7 g/dL (ref 32.0–37.0)
MCV: 78.1 fL (ref 78.0–100.0)
MONOCYTE #: 0.6 10*3/uL (ref 0.30–0.90)
MONOCYTE %: 9 % (ref 0–12)
NEUTROPHIL #: 3.6 10*3/uL (ref 1.70–7.00)
NEUTROPHIL %: 51 % (ref 50–70)
PLATELETS: 455 10*3/uL — ABNORMAL HIGH (ref 130–400)
RBC: 4.46 10*6/uL — ABNORMAL LOW (ref 4.70–5.40)
RDW: 15.5 % (ref 9.9–16.5)
WBC: 7.1 10*3/uL (ref 4.5–11.0)

## 2021-07-27 LAB — HIV1/HIV2 SCREEN, COMBINED ANTIGEN AND ANTIBODY: HIV SCREEN, COMBINED ANTIGEN & ANTIBODY: NEGATIVE

## 2021-07-27 LAB — COVID-19 ~~LOC~~ MOLECULAR LAB TESTING
INFLUENZA VIRUS TYPE A: NOT DETECTED
INFLUENZA VIRUS TYPE B: NOT DETECTED
RESPIRATORY SYNCTIAL VIRUS (RSV): NOT DETECTED
SARS-CoV-2: NOT DETECTED

## 2021-07-27 LAB — C-REACTIVE PROTEIN(CRP),INFLAMMATION: CRP INFLAMMATION: 77.2 mg/L — ABNORMAL HIGH (ref <8.0)

## 2021-07-27 LAB — SEDIMENTATION RATE: ERYTHROCYTE SEDIMENTATION RATE (ESR): 70 mm/hr — ABNORMAL HIGH (ref 0–15)

## 2021-07-27 LAB — HEPATITIS C ANTIBODY SCREEN WITH REFLEX TO HCV PCR: HCV ANTIBODY QUALITATIVE: REACTIVE — AB

## 2021-07-27 LAB — ADULT ROUTINE BLOOD CULTURE, SET OF 2 BOTTLES (BACTERIA AND YEAST): BLOOD CULTURE, ROUTINE: NO GROWTH

## 2021-07-27 MED ORDER — VANCOMYCIN IV - PHARMACIST TO DOSE PER PROTOCOL
Freq: Every day | Status: DC | PRN
Start: 2021-07-27 — End: 2021-07-29

## 2021-07-27 MED ORDER — ENOXAPARIN 40 MG/0.4 ML SUBCUTANEOUS SYRINGE
40.0000 mg | INJECTION | SUBCUTANEOUS | Status: DC
Start: 2021-07-27 — End: 2021-08-03
  Administered 2021-07-27 – 2021-07-28 (×2): 0 mg via SUBCUTANEOUS
  Administered 2021-07-28 – 2021-08-01 (×5): 40 mg via SUBCUTANEOUS
  Administered 2021-08-02 – 2021-08-03 (×2): 0 mg via SUBCUTANEOUS
  Filled 2021-07-27 (×11): qty 0.4

## 2021-07-27 MED ORDER — FLU VACCINE QS 2022-23(6MOS UP)(PF) 60 MCG(15 MCGX4)/0.5 ML IM SYRINGE
0.5000 mL | INJECTION | Freq: Once | INTRAMUSCULAR | Status: DC
Start: 2021-07-27 — End: 2021-08-03
  Administered 2021-07-27: 0 mL via INTRAMUSCULAR
  Filled 2021-07-27: qty 0.5

## 2021-07-27 MED ORDER — QUETIAPINE 100 MG TABLET
200.0000 mg | ORAL_TABLET | Freq: Every evening | ORAL | Status: DC
Start: 2021-07-27 — End: 2021-08-03
  Administered 2021-07-27 – 2021-08-02 (×7): 200 mg via ORAL
  Filled 2021-07-27 (×7): qty 2

## 2021-07-27 MED ORDER — MAGNESIUM HYDROXIDE 400 MG/5 ML ORAL SUSPENSION
15.0000 mL | Freq: Every day | ORAL | Status: DC | PRN
Start: 2021-07-27 — End: 2021-08-03

## 2021-07-27 MED ORDER — ACETAMINOPHEN 325 MG TABLET
650.0000 mg | ORAL_TABLET | ORAL | Status: DC | PRN
Start: 2021-07-27 — End: 2021-08-03
  Administered 2021-07-27 – 2021-07-28 (×4): 650 mg via ORAL
  Filled 2021-07-27 (×4): qty 2

## 2021-07-27 MED ORDER — MORPHINE 4 MG/ML INJECTION WRAPPER
4.0000 mg | INJECTION | INTRAMUSCULAR | Status: DC | PRN
Start: 2021-07-27 — End: 2021-07-29
  Administered 2021-07-27 – 2021-07-29 (×7): 4 mg via INTRAVENOUS
  Filled 2021-07-27 (×7): qty 1

## 2021-07-27 MED ORDER — VANCOMYCIN 1,000 MG INTRAVENOUS INJECTION
INTRAVENOUS | Status: DC
Start: 2021-07-27 — End: 2021-07-27
  Filled 2021-07-27: qty 20

## 2021-07-27 MED ORDER — SODIUM CHLORIDE 0.9 % INTRAVENOUS SOLUTION
INTRAVENOUS | Status: DC
Start: 2021-07-27 — End: 2021-07-28
  Administered 2021-07-28: 0 mL via INTRAVENOUS

## 2021-07-27 MED ORDER — KETOROLAC 30 MG/ML (1 ML) INJECTION SOLUTION
30.0000 mg | Freq: Four times a day (QID) | INTRAMUSCULAR | Status: DC | PRN
Start: 2021-07-27 — End: 2021-07-28
  Administered 2021-07-27 (×2): 30 mg via INTRAVENOUS
  Administered 2021-07-28: 0 mg via INTRAVENOUS
  Filled 2021-07-27 (×3): qty 1

## 2021-07-27 MED ORDER — VANCOMYCIN 1,000 MG INTRAVENOUS INJECTION
1250.0000 mg | Freq: Three times a day (TID) | INTRAVENOUS | Status: DC
Start: 2021-07-27 — End: 2021-07-29
  Administered 2021-07-27: 0 mg via INTRAVENOUS
  Administered 2021-07-27: 1250 mg via INTRAVENOUS
  Administered 2021-07-27: 0 mg via INTRAVENOUS
  Administered 2021-07-27 – 2021-07-28 (×3): 1250 mg via INTRAVENOUS
  Administered 2021-07-28 (×2): 0 mg via INTRAVENOUS
  Administered 2021-07-28 (×2): 1250 mg via INTRAVENOUS
  Administered 2021-07-28 (×2): 0 mg via INTRAVENOUS
  Administered 2021-07-28: 1250 mg via INTRAVENOUS
  Administered 2021-07-29: 0 mg via INTRAVENOUS
  Administered 2021-07-29: 1250 mg via INTRAVENOUS
  Administered 2021-07-29: 0 mg via INTRAVENOUS
  Administered 2021-07-29: 1250 mg via INTRAVENOUS
  Filled 2021-07-27 (×12): qty 20

## 2021-07-27 MED ORDER — ONDANSETRON HCL (PF) 4 MG/2 ML INJECTION SOLUTION
4.0000 mg | Freq: Four times a day (QID) | INTRAMUSCULAR | Status: DC | PRN
Start: 2021-07-27 — End: 2021-08-03

## 2021-07-27 MED ORDER — QUETIAPINE 100 MG TABLET
100.0000 mg | ORAL_TABLET | Freq: Every morning | ORAL | Status: DC
Start: 2021-07-27 — End: 2021-08-03
  Administered 2021-07-27 – 2021-08-03 (×8): 100 mg via ORAL
  Filled 2021-07-27 (×8): qty 1

## 2021-07-27 MED ORDER — SODIUM CHLORIDE 0.9 % INTRAVENOUS SOLUTION
INTRAVENOUS | Status: DC
Start: 2021-07-27 — End: 2021-07-27
  Filled 2021-07-27: qty 250

## 2021-07-27 MED ORDER — SODIUM CHLORIDE 0.9 % INTRAVENOUS PIGGYBACK
2.0000 g | Freq: Two times a day (BID) | INTRAVENOUS | Status: DC
Start: 2021-07-27 — End: 2021-07-28
  Administered 2021-07-27 (×2): 0 g via INTRAVENOUS
  Administered 2021-07-27 (×2): 2 g via INTRAVENOUS
  Administered 2021-07-28: 0 g via INTRAVENOUS
  Filled 2021-07-27 (×5): qty 12.5

## 2021-07-27 MED ORDER — PAROXETINE 20 MG TABLET
20.0000 mg | ORAL_TABLET | Freq: Every morning | ORAL | Status: DC
Start: 2021-07-27 — End: 2021-08-03
  Administered 2021-07-27 – 2021-08-03 (×8): 20 mg via ORAL
  Filled 2021-07-27 (×8): qty 1

## 2021-07-27 MED ORDER — QUETIAPINE ER 50 MG TABLET,EXTENDED RELEASE 24 HR
100.0000 mg | ORAL_TABLET | Freq: Every evening | ORAL | Status: DC
Start: 2021-07-27 — End: 2021-07-27

## 2021-07-27 NOTE — Consults (Signed)
This patient was seen this evening for consult of the right ankle due to severe edema with possible osteomyelitis of the ankle joint and the talonavicular and calcaneal area.  The patient relates that he had the surgical procedure proximally 8 months ago to clean out the right ankle.  He relates that the ankle is often times swollen and red.  He also relates that the pain has been increasing during ambulation and just to touch the ankle.  He relates that the condition has been worsening since the previous surgery.  No further procedures have been performed to the right ankle since the procedure a months ago.  There has been a Infectious Disease consult for this patient.  The patient is currently on Maxipime IV and vancomycin IV.  MRI shows diffuse subcutaneous soft tissue swelling and joint effusion could be infectious or inflammatory.  No evidence of soft tissue abscess.  Marrow signal abnormalities.  Osteomyelitis could involve the anterior calcaneus, talus, navicular bones.  See MRI report for full results and explanations.  X-ray examination shows progressive erosive changes to the tibiotalar joint and collapse of the dome of the talus.  The findings are suggestive of worsened bony changes from septic arthritis.  Also noted are metallic fragments in the distal tibia and the distal fibula.  There are also noted metallic fragments in the tibiotalar joint.  The patient has had positive drug screens.  See H&P for further information.    Examination the patient reveals he is lying comfortably in bed in no apparent distress.  Examination of the right ankle reveals it is extremely swollen with slight erythema and increase in skin temp.  It also is severely painful with palpation.  Plus three pitting edema of the entire ankle area.  The edema also is involved in the lower leg and foot.  There are multiple tattoos to the right and left lower extremity.  There are no open areas at this time.  There are small faint scars  at the area of the medial and lateral anterior ankle.  These are from previous surgery.  Pain is also noted with range of motion at the ankle joint.  Labs from 07/27/2021 at 0143 show a WBC of 7.1, hemoglobin 11.4, hematocrit of 34.8, platelet count of 455, RBC of 4.46.  The sed rate is noted to be elevated at 70.  See the remainder labs for further values.    Osteomyelitis of the right ankle.  Cellulitis of the right lower extremity.  Septic arthritis right ankle.  Severe degenerative changes right ankle.    Recommend continue IV antibiotics.  Possible need for cultures of right ankle.  We will discuss this with the Infectious Disease.  Possible white blood cell labeled bone scan.  We will discuss this with Infectious Disease.  I will continue to follow this patient.

## 2021-07-27 NOTE — Ancillary Notes (Signed)
Called consult to Dr Nikki Dom. Maylon Cos  07/27/2021, 08:30

## 2021-07-27 NOTE — Care Plan (Signed)
Patient alert and oriented. Lungs clear respirations easy non labored infrequent loose non productive cough 97% on RA. Abdomen soft BS present. Voiding clear yellow in urinal. Right lower leg 3+edema red hot swollen tingling present.Patient slept most of the day. Bed in low position and call light within reach. Will continue to monitor.

## 2021-07-27 NOTE — H&P (Signed)
Internal Medicine  Hospitalist Admission H&P        Date of Service: 07/27/2021  Bryan [name], Bryan [name], 46 y.o. male  Date of Admission:  07/27/2021  Date of Birth:  05-Bryan-1977  PCP: No Pcp    Information Obtained from: Patient      Chief Complaint:  Swollen ankle      HPI: Bryan [name] is a 46 y.o., White male who presents as a transfer from [facility] for right ankle pain and swelling for "a long time" now. Patient states that he has had trouble with his ankle in the past but has left the ER because it was taking too long. Patient does state that has been on antibiotics before but is unable to tell me which ones. At [facility] the patient was found to be anemic with a HGB 10.3, HCT 31.3, RBC 3.96, MCV 78.4, which does appear to be his baseline. His Sed rate was elevated at 74, CRP of 71.7, lactic of 2.8 which did trend down. Patient was transferred to [facility] for ortho consult after MRI of right ankle showed osteomyelitis. Patient denies any fevers at home. No chest pain or shortness of breath. Patient denies illegal drug use, however, urine drug screen at [facility] is positive for amphetamines, methamphetamines, and cannabinoids.       Past medical history:       Past Medical History:   Diagnosis Date   . Anxiety    . Back pain    . COPD (chronic obstructive pulmonary disease) (CMS HCC)    . Depression    . Osteochondrosis     right foot              Past surgical history:     Past Surgical History:   Procedure Laterality Date   . ANKLE SURGERY     . ANKLE SURGERY Right     cleaned out arthritis   . HX HERNIA REPAIR     . HX LUMBAR SPINE SURGERY     . HX ROTATOR CUFF REPAIR Right               Medication prior to admission     Medications Prior to Admission     Prescriptions    acetaminophen (TYLENOL) 325 mg Oral Tablet    Take 1 Tablet (325 mg total) by mouth Every 4 hours as needed for Pain    HYDROcodone-acetaminophen (NORCO) 5-325 mg Oral Tablet    Take 1 Tablet by mouth Every 6 hours as  needed for Pain    Patient not taking:  Reported on 07/26/2021    Ibuprofen (MOTRIN) 800 mg Oral Tablet    Take 1 Tablet (800 mg total) by mouth Three times a day as needed for Pain    Patient not taking:  Reported on 07/26/2021    PARoxetine (PAXIL) 20 mg Oral Tablet    Take 1 Tablet (20 mg total) by mouth Every morning    QUEtiapine (SEROQUEL XR) 150 mg Oral Tablet Sustained Release 24 hr    Take 100 mg by mouth Every night    QUEtiapine (SEROQUEL) 200 mg Oral Tablet    Take 1 Tablet (200 mg total) by mouth Every night    traZODone (DESYREL) 150 mg Oral Tablet    1 Tablet (150 mg total) Every night             Allergies     Allergies   Allergen Reactions   . Latex Rash   .  Adhesive Rash   . Tramadol Rash   . Meloxicam    . Vistaril [Hydroxyzine Hcl] Swelling          Family History    Family Medical History:     Problem Relation (Age of Onset)    Cancer Father            Social History     reports that he has been smoking cigarettes. He has a 30.00 pack-year smoking history. He has never used smokeless tobacco. He reports current drug use. Frequency: 7.00 times per week. Drugs: Marijuana and Opioid. He reports that he does not drink alcohol.      ROS:     General: No fever, chills, malaise or weight change  Skin:  No rashes, bruises.  Head/Neck:  No headache, LOC, dizziness, stiffness.  Eyes:  No redness, change of vision.  Ears:  No ringing, no pain.  Nose:  No congestion, epistaxis, sinus pressure.  Throat/Mouth:  No soreness, dysphagia.  Chest/Lungs:  No SOB, pleuritic pain, DOE, cough, sputum, hemoptysis.  CV:  No Chest pain, chest pressure, palpatations,pedal edema.  GI:  No nausea, vomiting, diarrhea, constipation, hematemisis, melena, hematochezia.  GU:  No dysuria, hematuria, flank pain, discharge.  Musculoskeletal:  SWELLING TO RLE   Psychiatric:  No depression, SI, thoughts of violence.  Neurologic:  No dizziness, seizures, numbness, tingling, weakness, imbalance.  Endocrine:  No heat or cold intolerance,  no history of diabetes or thyroid disease  The pertinent positives and negatives are as detailed above and in HPI, otherwise a full ROS otherwise negative.    Vital signs:    Temperature: 36.1 C (97 F) Heart Rate: 66 BP (Non-Invasive): 115/73   Respiratory Rate: 18 SpO2: 93 %       I personally reviewed all vitals    Physical Exam:     General:  in no acute distress   Eyes: pupils equal and round, reactive to light and accomodation, conjunctival clear  HENT: head atraumatic and normocephalic, mucous membranes moist  Neck: no JVD, no thyroid enlargement, no lymphadenopathy   Lungs: clear to auscultation bilaterally, no wheeze, rhonchi or rales evident   Cardiovascular: regular rate and rhythm S1 S2, no murmur detected, no friction rub evident    Abdomen: soft, BS x 4 present, non-tender, non-distended  Extremities/vascular: no edema evident, + 2 pulses bilaterally   Skin: swelling, edema to RLE.  Innumerable tattoos.  Neurologic: CN II-XII grossly intact and no focal deficit  Musculoskeletal:  Significant swelling of the right ankle  Lymphatics: no lymphadenopathy   Psych: normal affect     Labs:    Results for orders placed or performed during the hospital encounter of 07/27/21 (from the past 24 hour(s))   COMPREHENSIVE METABOLIC PANEL, NON-FASTING   Result Value Ref Range    SODIUM 136 136 - 145 mmol/L    POTASSIUM 3.7 3.5 - 5.1 mmol/L    CHLORIDE 100 96 - 111 mmol/L    CO2 TOTAL 29 22 - 30 mmol/L    ANION GAP 7 4 - Bryan mmol/L    BUN Bryan 8 - 25 mg/dL    CREATININE 0.65 (L) 0.75 - 1.35 mg/dL    BUN/CREA RATIO 20 6 - 22    ESTIMATED GFR >90 >=60 mL/min/BSA    ALBUMIN 2.6 (L) 3.5 - 5.0 g/dL     CALCIUM 8.9 8.5 - 10.0 mg/dL    GLUCOSE 81 65 - 125 mg/dL    ALKALINE PHOSPHATASE 77 45 -  115 U/L    ALT (SGPT) 29 10 - 55 U/L    AST (SGOT)  18 8 - 45 U/L    BILIRUBIN TOTAL 0.1 (L) 0.3 - 1.3 mg/dL    PROTEIN TOTAL 7.3 6.4 - 8.3 g/dL   C-REACTIVE PROTEIN(CRP),INFLAMMATION   Result Value Ref Range    CRP INFLAMMATION 77.2 (H)  <8.0 mg/L   SEDIMENTATION RATE   Result Value Ref Range    ERYTHROCYTE SEDIMENTATION RATE (ESR) 70 (H) 0 - 15 mm/hr   CBC WITH DIFF   Result Value Ref Range    WBC 7.1 4.5 - 11.0 x10^3/uL    RBC 4.46 (L) 4.70 - 5.40 x10^6/uL    HGB 11.4 (L) Bryan.5 - 18.0 g/dL    HCT 34.8 (L) 42.0 - 52.0 %    MCV 78.1 78.0 - 100.0 fL    MCH 25.5 (L) 28.0 - 33.0 pg    MCHC 32.7 32.0 - 37.0 g/dL    RDW 15.5 9.9 - 16.5 %    PLATELETS 455 (H) 130 - 400 x10^3/uL    NEUTROPHIL % 51 50 - 70 %    LYMPHOCYTE % 34 23 - 35 %    MONOCYTE % 9 0 - 12 %    EOSINOPHIL % 5 (H) 0 - 4 %    BASOPHIL % 1 0 - 1 %    NEUTROPHIL # 3.60 1.70 - 7.00 x10^3/uL    LYMPHOCYTE # 2.40 0.90 - 4.80 x10^3/uL    MONOCYTE # 0.60 0.30 - 0.90 x10^3/uL    EOSINOPHIL # 0.30 0.00 - 0.50 x10^3/uL    BASOPHIL # 0.10 0.00 - 0.30 x10^3/uL   COVID-19 SCREENING - Direct Admit/Transfer/ED Admit - NON-PUI   Result Value Ref Range    SARS-CoV-2 Not Detected Not Detected    INFLUENZA VIRUS TYPE A Not Detected Not Detected    INFLUENZA VIRUS TYPE B Not Detected Not Detected    RESPIRATORY SYNCTIAL VIRUS (RSV) Not Detected Not Detected       I personally reviewed all labs       Imaging Studies:   XR ANKLE RIGHT   Final Result   Progressive erosive changes at the tibiotalar joint with collapse of the dome of the talus. The findings are suggestive of worsening bony changes from septic arthritis.                   Radiologist location ID: [id number]             I personally reviewed all imaging       DNR Status:  Full Code      Problem List:     Active Hospital Problems    Diagnosis   . Primary Problem: Osteomyelitis (CMS HCC)           Assessment/Plan: Patient admitted to 2N, Orthopedics consulted, continue Vanc and Cefepime. Blood cultures are pending. Will monitor for signs of opiate/methamphetamine withdrawal. PRN pain medication. Trend labs.         DVT/PE Prophylaxis: Lovenox     I independently of the faculty provider spent a total of 30 minutes in direct/indirect care of this  patient including initial evaluation, review of laboratory, radiology, diagnostic studies, review of medical record, order entry and coordination of care.      Mechele Claude, APRN,FNP-BC      ======================  Patient seen and examined in conjunction with Mrs. Mechele Claude, APRN, FNP-BC as part of a shared service with an APP.  I saw patient on same date of service 07/27/2021.    My substantive findings are:  Bryan [name] who has followed with Dr. Fanny Bien and was last seen on 2/Bryan/23.  At that time he was offered a brace and oral antibiotic therapy with Keflex.  Patient denies history of drug use other than cannabis but his drug screen was positive for amphetamines and methamphetamines as well as tricyclic antidepressants.  He reports history of blood infection from dirty tattoo needle.  He has had PICC line antibiotics before, not for his ankle.  He has severe bony destruction and septic joint.  I am not certain what surgical procedures would be available for him but will consult Dr. Nikki Dom.  Could consider washout procedure and bone biopsy to help guide antibiotic therapy.  I think that he probably needs IV antibiotics and will consult with Infectious Diseases and consider PICC line if blood cultures are negative.  General:  Alert and oriented x3, well-developed well-nourished, no acute distress  HEENT:  Normocephalic atraumatic pupils equally round reactive sclerae anicteric, external ears and nares are patent, oropharynx clear  Neck:  Supple.  No JVD.  No thyromegaly.  No carotid bruit  Heart:  Regular rate and rhythm S1-S2 no murmur  Lungs:  Clear to auscultation bilaterally  Abdomen:  Soft nontender nondistended bowel sounds x4 normal  Extremities:  Right ankle deformity severe edema then diffuse tenderness  Neurologic:  Cranial nerves 2-12 are intact, no focal sensory or motor deficit  Musculoskeletal:  Right ankle significant swelling  Vascular:  Pulses equal upper and lower  extremities  Psychiatric:  Normal affect  Skin:  Warm and dry.  Swelling of the right ankle, innumerable tattoos.  Medical decision making that of high complexity.  I independently of the APP spent a total of (45) minutes in direct/indirect care of this patient including initial evaluation, review of laboratory, radiology, diagnostic studies, review of medical record, order entry and coordination of care.     Bryan Noa, DO [place]

## 2021-07-27 NOTE — Nurses Notes (Signed)
Patient's room noted to smell like cigarette smoke. This nurse reminded patient of no smoking policy. Nicotine patch or gum discussed. Patient stated, "if it's not a cigarette, I don't want it." House supervisor also in to review no smoking policy with patient

## 2021-07-27 NOTE — Care Management Notes (Signed)
Tobacco tx note:  This pt smokes 1/2 pack of cigarettes daily.  He is refusing nicotine replacement and is NOT interested in quitting at this time.

## 2021-07-27 NOTE — Pharmacy (Addendum)
[place] / [facility]  Therapeutic Drug Monitoring: Vancomycin  07/27/2021      Patient name: Bryan [name], Bryan [name]  Date of Birth:  May 02, 1975    Actual Weight:  Weight: 77.5 kg (170 lb 14.4 oz) (07/27/21 0016)     BMI:  BMI (Calculated): 23.23 (07/27/21 0016)      Date RPh Current regimen (including mg/kg) Indication &  Organism AUC or trough based dosing Target Levels^ SCr (mg/dL) CrCl* (mL/min) Infectious Laboratory Markers (as applicable)   Measured level(s)   (mcg/mL) Calculated AUC (if AUC based monitoring) Plan & predicted AUC/trough if initial dosing (including when levels are due) Comments   3/30 JJI 1250 mg Q8 [facility]      AUC 400-600 0.63 162.3 WBC:  [place]cal:  CRP:          528  Pk 3/31 @ 0800  Tr 3/31 @ 1200    3/31           Pk @ 1000  Tr @ 1400   Had to change times on vancomycin due to patient losing iv access, so 3/30 2100 dose was 2 hrs late and changed schedule to 0700/1500/2300.                                                                                    Katherina Mires levels depends on dosing and monitoring method, AUC vs. trough based. For AUC based dosing units are mg*h/L. For trough based dosing units are mcg/mL.     *Creatinine clearance is estimated by using the Cockcroft-Gault equation for adult patients and the Carol Ada for pediatric patients.    The decision to discontinue vancomycin therapy will be determined by the primary service.  Please contact the pharmacist with any questions regarding this patient's medication regimen.

## 2021-07-27 NOTE — Care Plan (Signed)
Patient admits from [facility] ER. Alert, oriented, cooperative. Respirations easy and even on room air. Ambulates with cane and ortho shell boot. Gait steady and slow. Right ankle +3 edema, warm to touch, reddened. Pedal pulse to rt foot weak but palpable. Patient states that he has had trouble with his ankle since surgery several years ago. Patient also verbalized that he completed treatment for polysubstance abuse 18 months ago but recently took some percocet for increased pain to his ankle. His is a 1/2 pack a day smoker. Denies need for patch or gum at this time. Patient welcomed by staff and oriented to room and unit. Safety policies reviewed. Verbalized understanding. Successful return demonstration of call light use.  Serafina Mitchell, RN  07/27/2021, 01:27      Problem: Opioid Dependence or Withdrawal  Goal: Withdrawal Symptoms Managed  07/27/2021 0122 by Serafina Mitchell, RN  Outcome: Ongoing (see interventions/notes)  07/27/2021 0122 by Serafina Mitchell, RN  Outcome: Ongoing (see interventions/notes)  Intervention: Promote Psychosocial Wellbeing  Recent Flowsheet Documentation  Taken 07/27/2021 0039 by Serafina Mitchell, RN  Environmental Support: calm environment promoted  Intervention: Partner to Facilitate Behavior Change  Recent Flowsheet Documentation  Taken 07/27/2021 0039 by Serafina Mitchell, RN  Supportive Measures:   active listening utilized   positive reinforcement provided   verbalization of feelings encouraged     Problem: Fall Injury Risk  Goal: Absence of Fall and Fall-Related Injury  07/27/2021 0122 by Serafina Mitchell, RN  Outcome: Ongoing (see interventions/notes)  07/27/2021 0122 by Serafina Mitchell, RN  Outcome: Ongoing (see interventions/notes)  Intervention: Identify and Manage Contributors  Recent Flowsheet Documentation  Taken 07/27/2021 0039 by Serafina Mitchell, RN  Self-Care Promotion:   independence encouraged   BADL personal objects within reach   adaptive equipment use encouraged  Intervention:  Promote Injury-Free Environment  Recent Flowsheet Documentation  Taken 07/27/2021 0039 by Serafina Mitchell, RN  Safety Promotion/Fall Prevention:   activity supervised   fall prevention program maintained   nonskid shoes/slippers when out of bed     Problem: Skin or Soft Tissue Infection  Goal: Absence of Infection Signs and Symptoms  Outcome: Ongoing (see interventions/notes)
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
## 2021-07-27 NOTE — Ancillary Notes (Signed)
Called consult to Dr Doyle Askew office. [name]  07/27/2021, 08:11

## 2021-07-28 DIAGNOSIS — F17200 Nicotine dependence, unspecified, uncomplicated: Secondary | ICD-10-CM | POA: Insufficient documentation

## 2021-07-28 LAB — CBC WITH DIFF
BASOPHIL #: 0.1 10*3/uL (ref 0.00–0.30)
BASOPHIL %: 1 % (ref 0–1)
EOSINOPHIL #: 0.1 10*3/uL (ref 0.00–0.50)
EOSINOPHIL %: 1 % (ref 0–4)
HCT: 31.2 % — ABNORMAL LOW (ref 42.0–52.0)
HGB: 10.5 g/dL — ABNORMAL LOW (ref 13.5–18.0)
LYMPHOCYTE #: 1.7 10*3/uL (ref 0.90–4.80)
LYMPHOCYTE %: 21 % — ABNORMAL LOW (ref 23–35)
MCH: 26.1 pg — ABNORMAL LOW (ref 28.0–33.0)
MCHC: 33.5 g/dL (ref 32.0–37.0)
MCV: 77.8 fL — ABNORMAL LOW (ref 78.0–100.0)
MONOCYTE #: 0.5 10*3/uL (ref 0.30–0.90)
MONOCYTE %: 6 % (ref 0–12)
NEUTROPHIL #: 6.1 10*3/uL (ref 1.70–7.00)
NEUTROPHIL %: 72 % — ABNORMAL HIGH (ref 50–70)
PLATELETS: 436 10*3/uL — ABNORMAL HIGH (ref 130–400)
RBC: 4.02 10*6/uL — ABNORMAL LOW (ref 4.70–5.40)
RDW: 15.2 % (ref 9.9–16.5)
WBC: 8.4 10*3/uL (ref 4.5–11.0)

## 2021-07-28 LAB — HEPATITIS B SURFACE ANTIGEN: HBV SURFACE ANTIGEN QUALITATIVE: NEGATIVE

## 2021-07-28 LAB — HEPATIC FUNCTION PANEL
ALBUMIN: 2.3 g/dL — ABNORMAL LOW (ref 3.5–5.0)
ALKALINE PHOSPHATASE: 64 U/L (ref 45–115)
ALT (SGPT): 25 U/L (ref 10–55)
AST (SGOT): 14 U/L (ref 8–45)
BILIRUBIN DIRECT: 0.1 mg/dL (ref 0.1–0.4)
BILIRUBIN TOTAL: 0.1 mg/dL — ABNORMAL LOW (ref 0.3–1.3)
PROTEIN TOTAL: 7.1 g/dL (ref 6.4–8.3)

## 2021-07-28 LAB — BASIC METABOLIC PANEL
ANION GAP: -2 mmol/L — ABNORMAL LOW (ref 4–13)
BUN/CREA RATIO: 18 (ref 6–22)
BUN: 13 mg/dL (ref 8–25)
CALCIUM: 7.7 mg/dL — ABNORMAL LOW (ref 8.5–10.0)
CHLORIDE: 118 mmol/L — ABNORMAL HIGH (ref 96–111)
CO2 TOTAL: 23 mmol/L (ref 22–30)
CREATININE: 0.74 mg/dL — ABNORMAL LOW (ref 0.75–1.35)
ESTIMATED GFR: >90 mL/min/BSA (ref >=60)
GLUCOSE: 89 mg/dL (ref 65–125)
POTASSIUM: 2.9 mmol/L — ABNORMAL LOW (ref 3.5–5.1)
SODIUM: 139 mmol/L (ref 136–145)

## 2021-07-28 LAB — HEPATITIS B CORE IGM, AB: HBV CORE IGM ANTIBODY QUALITATIVE: NEGATIVE

## 2021-07-28 LAB — HEPATITIS A (HAV) IGM ANTIBODY: HAV IGM: NEGATIVE

## 2021-07-28 MED ORDER — POTASSIUM CHLORIDE 10 MEQ/100ML IN STERILE WATER INTRAVENOUS PIGGYBACK
10.0000 meq | INJECTION | Freq: Once | INTRAVENOUS | Status: AC
Start: 2021-07-28 — End: 2021-07-28
  Administered 2021-07-28: 10 meq via INTRAVENOUS
  Administered 2021-07-28: 0 meq via INTRAVENOUS
  Filled 2021-07-28: qty 100

## 2021-07-28 MED ORDER — TRAZODONE 100 MG TABLET
200.0000 mg | ORAL_TABLET | Freq: Every evening | ORAL | Status: DC
Start: 2021-07-28 — End: 2021-08-03
  Administered 2021-07-28 – 2021-08-02 (×6): 200 mg via ORAL
  Filled 2021-07-28 (×6): qty 2

## 2021-07-28 MED ORDER — LOPERAMIDE 2 MG CAPSULE
2.0000 mg | ORAL_CAPSULE | ORAL | Status: DC | PRN
Start: 2021-07-28 — End: 2021-08-03
  Administered 2021-07-28 – 2021-07-29 (×2): 2 mg via ORAL
  Filled 2021-07-28 (×2): qty 1

## 2021-07-28 MED ORDER — SODIUM CHLORIDE 0.9 % (FLUSH) INJECTION SYRINGE
10.0000 mL | INJECTION | Freq: Three times a day (TID) | INTRAMUSCULAR | Status: DC
Start: 2021-07-28 — End: 2021-08-03
  Administered 2021-07-28: 0 mL via INTRAVENOUS
  Administered 2021-07-28: 10 mL via INTRAVENOUS
  Administered 2021-07-28 – 2021-07-29 (×2): 0 mL via INTRAVENOUS
  Administered 2021-07-29: 10 mL via INTRAVENOUS
  Administered 2021-07-29 – 2021-07-30 (×2): 0 mL via INTRAVENOUS
  Administered 2021-07-30 (×2): 10 mL via INTRAVENOUS
  Administered 2021-07-31: 0 mL via INTRAVENOUS
  Administered 2021-07-31 (×2): 10 mL via INTRAVENOUS
  Administered 2021-08-01 – 2021-08-03 (×8): 0 mL via INTRAVENOUS

## 2021-07-28 MED ORDER — SODIUM CHLORIDE 0.9 % INTRAVENOUS PIGGYBACK
2.0000 g | Freq: Two times a day (BID) | INTRAVENOUS | Status: DC
Start: 2021-07-28 — End: 2021-08-01
  Administered 2021-07-28: 2 g via INTRAVENOUS
  Administered 2021-07-28 – 2021-07-29 (×3): 0 g via INTRAVENOUS
  Administered 2021-07-30: 2 g via INTRAVENOUS
  Administered 2021-07-30 (×2): 0 g via INTRAVENOUS
  Administered 2021-07-30: 2 g via INTRAVENOUS
  Administered 2021-07-31: 0 g via INTRAVENOUS
  Administered 2021-07-31 (×2): 2 g via INTRAVENOUS
  Administered 2021-07-31 – 2021-08-01 (×2): 0 g via INTRAVENOUS
  Administered 2021-08-01: 2 g via INTRAVENOUS
  Filled 2021-07-28 (×12): qty 12.5

## 2021-07-28 MED ORDER — LIDOCAINE (PF) 20 MG/ML (2 %) INJECTION SOLUTION
10.0000 mL | Freq: Once | INTRAMUSCULAR | Status: AC
Start: 2021-07-28 — End: 2021-07-28
  Administered 2021-07-28: 3 mL via INTRADERMAL
  Filled 2021-07-28: qty 10

## 2021-07-28 MED ORDER — IBUPROFEN 600 MG TABLET
600.0000 mg | ORAL_TABLET | Freq: Four times a day (QID) | ORAL | Status: DC | PRN
Start: 2021-07-28 — End: 2021-08-03
  Administered 2021-07-28 – 2021-08-02 (×3): 600 mg via ORAL
  Filled 2021-07-28 (×3): qty 1

## 2021-07-28 MED ORDER — SODIUM CHLORIDE 0.9 % (FLUSH) INJECTION SYRINGE
20.0000 mL | INJECTION | INTRAMUSCULAR | Status: DC | PRN
Start: 2021-07-28 — End: 2021-08-03

## 2021-07-28 MED ORDER — ALTEPLASE 2MG/2ML SOLUTION FOR CATH CLEARANCE
2.0000 mg | Freq: Every day | INTRAMUSCULAR | Status: DC | PRN
Start: 2021-07-28 — End: 2021-08-03
  Administered 2021-08-01: 2 mg
  Filled 2021-07-28: qty 2

## 2021-07-28 MED ORDER — POTASSIUM CHLORIDE ER 20 MEQ TABLET,EXTENDED RELEASE(PART/CRYST)
40.0000 meq | ORAL_TABLET | Freq: Once | ORAL | Status: AC
Start: 2021-07-28 — End: 2021-07-28
  Administered 2021-07-28: 40 meq via ORAL
  Filled 2021-07-28: qty 2

## 2021-07-28 NOTE — Progress Notes (Signed)
The patient was seen this evening for follow-up visit for right ankle cellulitis with possible osteomyelitis, possible Charcot ankle.  The patient has no new complaints at this time.  We discussed the procedure for this evening with his aspiration of joint fluid for cultures.  The patient agrees to have this procedure performed tonight.  I explained all the risks, complications and benefits of the planned procedure.  The patient understands all the risks, complications and benefits of the planned procedure.  The consent form was signed and witnessed and is on the chart.  The patient wishes to proceed with today's planned procedure.  We will proceed with today's planned procedure.    Examination of the patient reveals that he is lying comfortably in bed in no apparent distress.  Examination of the right ankle reveals it is still swollen but has no erythema at this time.  There is decreased pain of the right ankle with palpation and range of motion.  The right ankle is sterile slightly more warm than the left ankle.  No other changes noted.    Osteomyelitis of the right ankle.  Cellulitis of the right lower extremity.  Charcot ankle right ankle.  Septic arthritis right ankle.  Severe degenerative changes right ankle.    The right ankle was prepped with Betadine.  The area was anesthetized with 3 cc of 2% lidocaine plain.  A 18 gauge needle was then introduced into the ankle.  Aspiration of approximally 1-2 mL of serosanguineous fluid was expressed.  No purulent fluid was expressed.  The aspirate was then put in culture tubes and sent to the lab for examination.  The patient tolerated the procedure well.  The Betadine was cleansed from the skin.  A large adhesive bandage was placed over the aspirate site.    Continue current IV antibiotics.  I will continue to follow this patient  We will determine the next step with this individual depending on the culture results.  Possible need for further surgical intervention to  the right ankle.

## 2021-07-28 NOTE — Care Plan (Signed)
Patient alert and oriented. Lungs clear respirations easy no labored. Abdomen soft BS present. Voiding clear yellow in urinal. Right leg ,ankle and rt. foot edema noted patient states swelling has gone down. . Patient lost iv access this morning PICC line placed today. Medicated for pain. Bed in low position and call light within reach. Will continue to monitor.

## 2021-07-28 NOTE — Care Plan (Signed)
Pt has slept at short intervals. Lost IV access and had to get a mid line to right upper arm. He has requested the fluids be stopped due to the noise of the pump. He is eating and drinking well. Medicated for pain as needed. The RLE remains red, hot, and edematous. Call bell and personal items in reach.

## 2021-07-28 NOTE — Procedures (Signed)
The patient was prepped at the bedside for this procedure.  A consent form was signed and witnessed and is on the chart.  Local anesthesia was used for this procedure.    Preoperative diagnosis:  Osteomyelitis, cellulitis, Charcot ankle right ankle.    Postoperative diagnosis:  Same    Procedure:  Needle aspiration of fluid of right ankle for culture.    Findings:  No purulent fluid was noted in the aspirate of the right ankle.  The fluid was serosanguineous.    Surgeon:  Dr. Geraldo Pitter    Assistant:  Nursing staff    Anesthesia:  Local anesthesia with the 3 cc of 2% lidocaine plain in the form of a local block on the ankle.    Hemostasis:  Direct pressure    Estimated blood loss:  1 mL    Complications: None    Cultures:  Needle aspirate for aerobic and anaerobic cultures.    Disposition of patient:  Remained in bed.    Condition:  Stable    Procedure in detail:  The patient remained in bed in a supine position.  The patient's right ankle was prepped with Betadine.  A total of 3 cc of 2% lidocaine plain was utilized to the for local anesthesia of the anterior lateral right ankle.  An 18 gauge needle was inserted into the area the ankle that was anesthetized.  The ankle joint was identified and the needle was advanced into the ankle joint.  Aspiration of approximally 1-2 mL of serosanguineous fluid was expressed out of the right ankle.  No purulent fluid was identified or expressed.  The fluid aspirate was then put in cultures tube and sent to the lab for examination.  The patient tolerated the procedure well.  The area of Betadine was cleansed with the alcohol.  A large adhesive bandage was then placed over the area of the aspirate site.  The patient's foot and ankle was then elevated on a pillow.

## 2021-07-28 NOTE — Nurses Notes (Signed)
D. r Goodwin in preformed needle aspiration of right ankle Band-Aid applied specimen delivered to lab.

## 2021-07-28 NOTE — Progress Notes (Signed)
Hospitalist Progress Note    Bryan [name]       46 y.o.       Date of service: 07/28/2021  Date of Admission:  07/27/2021    Hospital Day:  LOS: 1 day       Assessment/Plan:   Active Hospital Problems    Diagnosis   . Primary Problem: Osteomyelitis (CMS HCC)     Septic arthritis right ankle, Charcot arthropathy in differential diagnosis.  Follow cultures.  Continue with broad-spectrum antibiotics.  Appreciate input from Infectious Diseases and Podiatry.  Patient may need bone biopsy or cultures from the ankle/washout to help guide antimicrobial therapy.  PICC line has been ordered.  Pain control as per orders.  Will order Motrin and morphine for breakthrough.         DVT/PE Prophylaxis: Enoxaparin      Disposition Planning: Home discharge        CC:  Ankle pain      Subjective: Patient denies any new complaint, ankle denies chest pain, SOB, abdominal pain, fever, chills, nausea or vomiting      Medications reviewed.  acetaminophen (TYLENOL) tablet, 650 mg, Oral, Q4H PRN  alteplase (CATHFLO ACTIVASE) 2 mg/2 mL injection, 2 mg, Intra-Catheter, Daily PRN  cefepime (MAXIPIME) 2 g in NS 100 mL IVPB minibag, 2 g, Intravenous, Q12H  enoxaparin PF (LOVENOX) 40 mg/0.4 mL SubQ injection, 40 mg, Subcutaneous, Q24H  influenza virus vaccine (PF) IM injection (FLUARIX for ages 6 months through adult), 0.5 mL, IntraMUSCULAR, Once  ketorolac (TORADOL) 30 mg/mL injection, 30 mg, Intravenous, Q6H PRN  magnesium hydroxide (MILK OF MAGNESIA) 400mg  per 79mL oral liquid, 15 mL, Oral, Daily PRN  morphine 4 mg/mL injection, 4 mg, Intravenous, Q4H PRN  NS flush syringe, 10 mL, Intravenous, Q8HRS  NS flush syringe, 20 mL, Intravenous, Q1 MIN PRN  NS premix infusion, , Intravenous, Continuous  ondansetron (ZOFRAN) 2 mg/mL injection, 4 mg, Intravenous, Q6H PRN  PARoxetine (PAXIL) tablet, 20 mg, Oral, QAM  QUEtiapine (SEROQUEL) tablet, 200 mg, Oral, NIGHTLY  QUEtiapine (SEROQUEL) tablet, 100 mg, Oral, Daily after Breakfast  traZODone  (DESYREL) tablet, 200 mg, Oral, NIGHTLY  vancomycin (VANCOCIN) 1,250 mg in NS 250 mL IVPB, 1,250 mg, Intravenous, Q8H  Vancomycin IV - Pharmacist to Dose per Protocol, , Does not apply, Daily PRN        I/O last 24 hours:     Intake/Output Summary (Last 24 hours) at 07/28/2021 1210  Last data filed at 07/28/2021 0855  Gross per 24 hour   Intake 1125 ml   Output 400 ml   Net 725 ml         Filed Vitals:    07/27/21 2013 07/27/21 2106 07/27/21 2132 07/28/21 0534   BP: (!) 141/72   (!) 135/98   Pulse: 70   77   Resp: 18 18  18    Temp: 36.7 C (98.1 F)  36.7 C (98.1 F) 36.7 C (98 F)   SpO2: 98%   98%     I have personally reviewed the vitals.      Physical Exam:    General: chronically ill appearing, NAD, vitals signs reviewed   HEENT: NCAT, pupils equal, sclerae anicteric, mucus membranes moist    Pulm: CTA bilaterally, no wheeze or rhonchi evident  Cardiac: RRR S1 S2, no murmur  GI: soft, BS x4 normal, non-tender, non-distended   Skin:  Significant for a ankle deformity swelling, erythema, warmth, tattoos everywhere  Neurologic: grossly intact, no focal findings  Psych: normal mood, normal affect   Vascular: equal pulses, grossly normal    Labs: I personally reviewed all labs.   No results found for any visits on 07/27/21 (from the past 24 hour(s)).    Imaging: I personally reviewed all imaging.   XR ANKLE RIGHT   Final Result   Progressive erosive changes at the tibiotalar joint with collapse of the dome of the talus. The findings are suggestive of worsening bony changes from septic arthritis.                   Radiologist location ID: [id number]             Cedric Fishman, DO

## 2021-07-29 LAB — CBC WITH DIFF
BASOPHIL #: 0 10*3/uL (ref 0.00–0.30)
BASOPHIL %: 0 % (ref 0–1)
EOSINOPHIL #: 0.2 10*3/uL (ref 0.00–0.50)
EOSINOPHIL %: 2 % (ref 0–4)
HCT: 35.8 % — ABNORMAL LOW (ref 42.0–52.0)
HGB: 11.8 g/dL — ABNORMAL LOW (ref 13.5–18.0)
LYMPHOCYTE #: 3.4 10*3/uL (ref 0.90–4.80)
LYMPHOCYTE %: 25 % (ref 23–35)
MCH: 25.8 pg — ABNORMAL LOW (ref 28.0–33.0)
MCHC: 32.9 g/dL (ref 32.0–37.0)
MCV: 78.2 fL (ref 78.0–100.0)
MONOCYTE #: 0.7 10*3/uL (ref 0.30–0.90)
MONOCYTE %: 5 % (ref 0–12)
NEUTROPHIL #: 9.1 10*3/uL — ABNORMAL HIGH (ref 1.70–7.00)
NEUTROPHIL %: 67 % (ref 50–70)
PLATELETS: 521 10*3/uL — ABNORMAL HIGH (ref 130–400)
RBC: 4.58 10*6/uL — ABNORMAL LOW (ref 4.70–5.40)
RDW: 15.4 % (ref 9.9–16.5)
WBC: 13.4 10*3/uL — ABNORMAL HIGH (ref 4.5–11.0)

## 2021-07-29 LAB — BASIC METABOLIC PANEL
ANION GAP: 9 mmol/L (ref 4–13)
BUN/CREA RATIO: 18 (ref 6–22)
BUN: 12 mg/dL (ref 8–25)
CALCIUM: 8.9 mg/dL (ref 8.5–10.0)
CHLORIDE: 106 mmol/L (ref 96–111)
CO2 TOTAL: 24 mmol/L (ref 22–30)
CREATININE: 0.65 mg/dL — ABNORMAL LOW (ref 0.75–1.35)
ESTIMATED GFR: >90 mL/min/BSA (ref >=60)
GLUCOSE: 85 mg/dL (ref 65–125)
POTASSIUM: 4.3 mmol/L (ref 3.5–5.1)
SODIUM: 139 mmol/L (ref 136–145)

## 2021-07-29 LAB — VANCOMYCIN, TROUGH: VANCOMYCIN TROUGH: 9.3 ug/mL — ABNORMAL LOW (ref 10.0–20.0)

## 2021-07-29 LAB — C-REACTIVE PROTEIN(CRP),INFLAMMATION: CRP INFLAMMATION: 21.9 mg/L — ABNORMAL HIGH (ref <8.0)

## 2021-07-29 LAB — VANCOMYCIN PEAK: VANCOMYCIN PEAK: 16.3 ug/mL — ABNORMAL LOW (ref 30.0–40.0)

## 2021-07-29 LAB — CREATINE KINASE (CK), TOTAL, SERUM: CREATINE KINASE: 38 U/L — ABNORMAL LOW (ref 45–225)

## 2021-07-29 MED ORDER — HYDROCODONE 5 MG-ACETAMINOPHEN 325 MG TABLET
1.0000 | ORAL_TABLET | ORAL | Status: DC | PRN
Start: 2021-07-29 — End: 2021-08-03
  Administered 2021-07-29 – 2021-08-03 (×26): 1 via ORAL
  Filled 2021-07-29 (×26): qty 1

## 2021-07-29 MED ORDER — SODIUM CHLORIDE 0.9 % INTRAVENOUS SOLUTION
6.0000 mg/kg | INTRAVENOUS | Status: DC
Start: 2021-07-29 — End: 2021-08-01
  Administered 2021-07-29: 0 mg via INTRAVENOUS
  Administered 2021-07-29: 450 mg via INTRAVENOUS
  Administered 2021-07-30: 0 mg via INTRAVENOUS
  Administered 2021-07-30 – 2021-07-31 (×2): 450 mg via INTRAVENOUS
  Administered 2021-07-31: 0 mg via INTRAVENOUS
  Filled 2021-07-29 (×4): qty 9

## 2021-07-29 MED ORDER — SACCHAROMYCES BOULARDII 250 MG CAPSULE
250.0000 mg | ORAL_CAPSULE | Freq: Every day | ORAL | Status: DC
Start: 2021-07-29 — End: 2021-08-03
  Administered 2021-07-29 – 2021-08-03 (×6): 250 mg via ORAL
  Filled 2021-07-29 (×6): qty 1

## 2021-07-29 MED ORDER — MORPHINE 4 MG/ML INJECTION WRAPPER
4.0000 mg | INJECTION | INTRAMUSCULAR | Status: DC | PRN
Start: 2021-07-29 — End: 2021-08-03
  Administered 2021-07-29 – 2021-08-03 (×23): 4 mg via INTRAVENOUS
  Filled 2021-07-29 (×23): qty 1

## 2021-07-29 NOTE — Progress Notes (Signed)
Hospitalist Progress Note    Bryan [name]       46 y.o.       Date of service: 07/29/2021  Date of Admission:  07/27/2021    Hospital Day:  LOS: 2 days       Assessment/Plan:   Active Hospital Problems    Diagnosis   . Primary Problem: Osteomyelitis (CMS HCC)   . Tobacco use disorder          Septic right ankle arthritis/osteomyelitis/Charcot arthropathy in differential diagnosis.  Status post aspiration, follow cultures.  Continue with broad-spectrum antibiotics and PICC line has been established.  Pain control as per orders, Motrin, add Norco for pain, morphine only for breakthrough pain.    Tobacco abuse disorder, smoking cessation counseling.    DVT/PE Prophylaxis: Enoxaparin      Disposition Planning: Home discharge  with outpatient antibiotics versus home health       CC: Osteomyelitis (CMS HCC), ankle pain      Subjective: Patient denies chest pain, SOB, abdominal pain, fever, chills, nausea or vomiting.  Does complain of ongoing pain in the ankle.  Appreciate input from consultants.      Medications reviewed.  acetaminophen (TYLENOL) tablet, 650 mg, Oral, Q4H PRN  alteplase (CATHFLO ACTIVASE) 2 mg/2 mL injection, 2 mg, Intra-Catheter, Daily PRN  cefepime (MAXIPIME) 2 g in NS 100 mL IVPB minibag, 2 g, Intravenous, Q12H  enoxaparin PF (LOVENOX) 40 mg/0.4 mL SubQ injection, 40 mg, Subcutaneous, Q24H  HYDROcodone-acetaminophen (NORCO) 5-325 mg per tablet, 1 Tablet, Oral, Q4H PRN  ibuprofen (MOTRIN) tablet, 600 mg, Oral, Q6H PRN  influenza virus vaccine (PF) IM injection (FLUARIX for ages 6 months through adult), 0.5 mL, IntraMUSCULAR, Once  loperamide (IMODIUM) capsule, 2 mg, Oral, Q4H PRN  magnesium hydroxide (MILK OF MAGNESIA) 400mg  per 6mL oral liquid, 15 mL, Oral, Daily PRN  morphine 4 mg/mL injection, 4 mg, Intravenous, Q4H PRN  NS flush syringe, 10 mL, Intravenous, Q8HRS  NS flush syringe, 20 mL, Intravenous, Q1 MIN PRN  ondansetron (ZOFRAN) 2 mg/mL injection, 4 mg, Intravenous, Q6H PRN  PARoxetine  (PAXIL) tablet, 20 mg, Oral, QAM  QUEtiapine (SEROQUEL) tablet, 200 mg, Oral, NIGHTLY  QUEtiapine (SEROQUEL) tablet, 100 mg, Oral, Daily after Breakfast  saccharomyces boulardii (FLORASTOR) capsule, 250 mg, Oral, Daily  traZODone (DESYREL) tablet, 200 mg, Oral, NIGHTLY  vancomycin (VANCOCIN) 1,250 mg in NS 250 mL IVPB, 1,250 mg, Intravenous, Q8H  Vancomycin IV - Pharmacist to Dose per Protocol, , Does not apply, Daily PRN        I/O last 24 hours:     Intake/Output Summary (Last 24 hours) at 07/29/2021 1123  Last data filed at 07/29/2021 0812  Gross per 24 hour   Intake 1969 ml   Output 700 ml   Net 1269 ml         Filed Vitals:    07/28/21 2029 07/29/21 0035 07/29/21 0358 07/29/21 0457   BP: 117/73  136/82    Pulse: 65  93    Resp: 18 16 18 18    Temp: 37.3 C (99.1 F)  36.4 C (97.5 F)    SpO2: 100%  100%      I have personally reviewed the vitals.      Physical Exam:    General:  Comfortable appearing, NAD, vitals signs reviewed   HEENT: NCAT, pupils equal, sclerae anicteric, mucus membranes moist    Pulm: CTA bilaterally, no wheeze or rhonchi evident  Cardiac: RRR S1 S2, no murmur  GI: soft, BS x4 normal, non-tender, non-distended   MSK:  Right ankle edema   Skin:  Warm and dry, mild erythema of the right ankle  Neurologic: grossly intact, no focal findings   Psych: normal mood, normal affect   Vascular: equal pulses, grossly normal    Labs: I personally reviewed all labs.   Results for orders placed or performed during the hospital encounter of 07/27/21 (from the past 24 hour(s))   BASIC METABOLIC PANEL - AM ONCE   Result Value Ref Range    SODIUM 139 136 - 145 mmol/L    POTASSIUM 2.9 (L) 3.5 - 5.1 mmol/L    CHLORIDE 118 (H) 96 - 111 mmol/L    CO2 TOTAL 23 22 - 30 mmol/L    ANION GAP -2 (L) 4 - 13 mmol/L    CALCIUM 7.7 (L) 8.5 - 10.0 mg/dL    GLUCOSE 89 65 - 008 mg/dL    BUN 13 8 - 25 mg/dL    CREATININE 6.76 (L) 0.75 - 1.35 mg/dL    BUN/CREA RATIO 18 6 - 22    ESTIMATED GFR >90 >=60 mL/min/BSA   HEPATITIS B CORE  IGM, AB   Result Value Ref Range    HBV CORE IGM ANTIBODY QUALITATIVE Negative Negative   HEPATIC FUNCTION PANEL - AM ONCE   Result Value Ref Range    ALBUMIN 2.3 (L) 3.5 - 5.0 g/dL     ALKALINE PHOSPHATASE 64 45 - 115 U/L    ALT (SGPT) 25 10 - 55 U/L    AST (SGOT)  14 8 - 45 U/L    BILIRUBIN TOTAL 0.1 (L) 0.3 - 1.3 mg/dL    BILIRUBIN DIRECT 0.1 0.1 - 0.4 mg/dL    PROTEIN TOTAL 7.1 6.4 - 8.3 g/dL   CBC WITH DIFF   Result Value Ref Range    WBC 8.4 4.5 - 11.0 x10^3/uL    RBC 4.02 (L) 4.70 - 5.40 x10^6/uL    HGB 10.5 (L) 13.5 - 18.0 g/dL    HCT 19.5 (L) 09.3 - 52.0 %    MCV 77.8 (L) 78.0 - 100.0 fL    MCH 26.1 (L) 28.0 - 33.0 pg    MCHC 33.5 32.0 - 37.0 g/dL    RDW 26.7 9.9 - 12.4 %    PLATELETS 436 (H) 130 - 400 x10^3/uL    NEUTROPHIL % 72 (H) 50 - 70 %    LYMPHOCYTE % 21 (L) 23 - 35 %    MONOCYTE % 6 0 - 12 %    EOSINOPHIL % 1 0 - 4 %    BASOPHIL % 1 0 - 1 %    NEUTROPHIL # 6.10 1.70 - 7.00 x10^3/uL    LYMPHOCYTE # 1.70 0.90 - 4.80 x10^3/uL    MONOCYTE # 0.50 0.30 - 0.90 x10^3/uL    EOSINOPHIL # 0.10 0.00 - 0.50 x10^3/uL    BASOPHIL # 0.10 0.00 - 0.30 x10^3/uL   C-REACTIVE PROTEIN(CRP),INFLAMMATION   Result Value Ref Range    CRP INFLAMMATION 21.9 (H) <8.0 mg/L   BASIC METABOLIC PANEL - AM ONCE   Result Value Ref Range    SODIUM 139 136 - 145 mmol/L    POTASSIUM 4.3 3.5 - 5.1 mmol/L    CHLORIDE 106 96 - 111 mmol/L    CO2 TOTAL 24 22 - 30 mmol/L    ANION GAP 9 4 - 13 mmol/L    CALCIUM 8.9 8.5 - 10.0 mg/dL    GLUCOSE 85  65 - 125 mg/dL    BUN 12 8 - 25 mg/dL    CREATININE 1.610.65 (L) 0.75 - 1.35 mg/dL    BUN/CREA RATIO 18 6 - 22    ESTIMATED GFR >90 >=60 mL/min/BSA   CBC WITH DIFF   Result Value Ref Range    WBC 13.4 (H) 4.5 - 11.0 x10^3/uL    RBC 4.58 (L) 4.70 - 5.40 x10^6/uL    HGB 11.8 (L) 13.5 - 18.0 g/dL    HCT 09.635.8 (L) 04.542.0 - 52.0 %    MCV 78.2 78.0 - 100.0 fL    MCH 25.8 (L) 28.0 - 33.0 pg    MCHC 32.9 32.0 - 37.0 g/dL    RDW 40.915.4 9.9 - 81.116.5 %    PLATELETS 521 (H) 130 - 400 x10^3/uL    NEUTROPHIL % 67 50 - 70 %     LYMPHOCYTE % 25 23 - 35 %    MONOCYTE % 5 0 - 12 %    EOSINOPHIL % 2 0 - 4 %    BASOPHIL % 0 0 - 1 %    NEUTROPHIL # 9.10 (H) 1.70 - 7.00 x10^3/uL    LYMPHOCYTE # 3.40 0.90 - 4.80 x10^3/uL    MONOCYTE # 0.70 0.30 - 0.90 x10^3/uL    EOSINOPHIL # 0.20 0.00 - 0.50 x10^3/uL    BASOPHIL # 0.00 0.00 - 0.30 x10^3/uL   VANCOMYCIN PEAK   Result Value Ref Range    VANCOMYCIN PEAK 16.3 (L) 30.0 - 40.0 ug/mL       Imaging: I personally reviewed all imaging.   XR ANKLE RIGHT   Final Result   Progressive erosive changes at the tibiotalar joint with collapse of the dome of the talus. The findings are suggestive of worsening bony changes from septic arthritis.                   Radiologist location ID: [id number]             Cedric Fishmanhomas Waltz, DO

## 2021-07-29 NOTE — Nurses Notes (Signed)
Pt has been alert and oriented throughout shift. Medicated multiply times throughout shift, see MAR.Denies any n/v or sob. PRN imodium given. RLE red, +2 edema. Up independently , able to make needs known. Will continue to monitor.

## 2021-07-29 NOTE — Care Plan (Signed)
nutrition late entry:  high risk notification for decreased dietary intake.  pt currently eating better, 75% of meals. monitor need for nutrition supplements

## 2021-07-29 NOTE — Care Plan (Addendum)
Patient alert & oriented x4. Denies SOB. Lungs are clear bilaterally on RA. Refused 2300 vancomycin & 0200 maxipime due to diarrhea. Patient given PRN imodium and educated on importance of antibiotics with his current diagnosis. Patient states he "just wants to let things settle down before taking more."  C/o pain to RLE. PRN meds given per MAR. RLE appears red with 2+ edema. Up ad lib, has cane & ortho boot at bedside. Able to make needs known. Bed locked & in low position. Call light & personal items within reach.

## 2021-07-30 DIAGNOSIS — M86171 Other acute osteomyelitis, right ankle and foot: Principal | ICD-10-CM

## 2021-07-30 DIAGNOSIS — M25571 Pain in right ankle and joints of right foot: Secondary | ICD-10-CM

## 2021-07-30 DIAGNOSIS — M00871 Arthritis due to other bacteria, right ankle and foot: Secondary | ICD-10-CM

## 2021-07-30 LAB — BASIC METABOLIC PANEL
ANION GAP: 8 mmol/L (ref 4–13)
BUN/CREA RATIO: 26 — ABNORMAL HIGH (ref 6–22)
BUN: 16 mg/dL (ref 8–25)
CALCIUM: 9.2 mg/dL (ref 8.5–10.0)
CHLORIDE: 103 mmol/L (ref 96–111)
CO2 TOTAL: 26 mmol/L (ref 22–30)
CREATININE: 0.62 mg/dL — ABNORMAL LOW (ref 0.75–1.35)
ESTIMATED GFR: >90 mL/min/BSA (ref >=60)
GLUCOSE: 103 mg/dL (ref 65–125)
POTASSIUM: 3.5 mmol/L (ref 3.5–5.1)
SODIUM: 137 mmol/L (ref 136–145)

## 2021-07-30 LAB — CBC WITH DIFF
BASOPHIL #: 0.1 10*3/uL (ref 0.00–0.30)
BASOPHIL %: 1 % (ref 0–1)
EOSINOPHIL #: 0.3 10*3/uL (ref 0.00–0.50)
EOSINOPHIL %: 3 % (ref 0–4)
HCT: 34.1 % — ABNORMAL LOW (ref 42.0–52.0)
HGB: 11.6 g/dL — ABNORMAL LOW (ref 13.5–18.0)
LYMPHOCYTE #: 3 10*3/uL (ref 0.90–4.80)
LYMPHOCYTE %: 29 % (ref 23–35)
MCH: 26.2 pg — ABNORMAL LOW (ref 28.0–33.0)
MCHC: 33.9 g/dL (ref 32.0–37.0)
MCV: 77.2 fL — ABNORMAL LOW (ref 78.0–100.0)
MONOCYTE #: 0.7 10*3/uL (ref 0.30–0.90)
MONOCYTE %: 6 % (ref 0–12)
NEUTROPHIL #: 6.3 10*3/uL (ref 1.70–7.00)
NEUTROPHIL %: 61 % (ref 50–70)
PLATELETS: 465 10*3/uL — ABNORMAL HIGH (ref 130–400)
RBC: 4.41 10*6/uL — ABNORMAL LOW (ref 4.70–5.40)
RDW: 15.6 % (ref 9.9–16.5)
WBC: 10.4 10*3/uL (ref 4.5–11.0)

## 2021-07-30 LAB — STERILE SITE CULTURE AND GRAM STAIN, AEROBIC: FLC: NO GROWTH

## 2021-07-30 NOTE — Progress Notes (Signed)
Hospitalist Progress Note    Bryan [name]       46 y.o.       Date of service: 07/30/2021  Date of Admission:  07/27/2021    Hospital Day:  LOS: 3 days       Assessment/Plan:   Active Hospital Problems    Diagnosis   . Primary Problem: Osteomyelitis (CMS HCC)   . Tobacco use disorder     Septic right ankle arthritis/osteomyelitis/Charcot arthropathy in differential diagnosis.  Joint cultures showing no growth after 2 days.  Continue with broad-spectrum antibiotics and PICC line has been established.  Nuclear white tagging study is ordered.  Pain control as per orders, Motrin, add Norco for pain, morphine only for breakthrough pain.    Vancomycin discontinued in favor of the daptomycin because it was difficult to get high enough levels with vancomycin.    Tobacco abuse disorder, smoking cessation counseling.        DVT/PE Prophylaxis: Enoxaparin      Disposition Planning: Home discharge        CC: Osteomyelitis (CMS HCC)      Subjective: Right foot erythema and edema is improving. PICC line is in place to LUE.  Reports continued pain but reports it is well controlled with current medications.  Patient denies chest pain, SOB, abdominal pain, fever, chills, nausea or vomiting.      Medications reviewed.  acetaminophen (TYLENOL) tablet, 650 mg, Oral, Q4H PRN  alteplase (CATHFLO ACTIVASE) 2 mg/2 mL injection, 2 mg, Intra-Catheter, Daily PRN  cefepime (MAXIPIME) 2 g in NS 100 mL IVPB minibag, 2 g, Intravenous, Q12H  DAPTOmycin (CUBICIN) 450 mg in NS 50 mL IVPB, 6 mg/kg (Adjusted), Intravenous, Q24H  enoxaparin PF (LOVENOX) 40 mg/0.4 mL SubQ injection, 40 mg, Subcutaneous, Q24H  HYDROcodone-acetaminophen (NORCO) 5-325 mg per tablet, 1 Tablet, Oral, Q4H PRN  ibuprofen (MOTRIN) tablet, 600 mg, Oral, Q6H PRN  influenza virus vaccine (PF) IM injection (FLUARIX for ages 6 months through adult), 0.5 mL, IntraMUSCULAR, Once  loperamide (IMODIUM) capsule, 2 mg, Oral, Q4H PRN  magnesium hydroxide (MILK OF MAGNESIA) 400mg  per  68mL oral liquid, 15 mL, Oral, Daily PRN  morphine 4 mg/mL injection, 4 mg, Intravenous, Q4H PRN  NS flush syringe, 10 mL, Intravenous, Q8HRS  NS flush syringe, 20 mL, Intravenous, Q1 MIN PRN  ondansetron (ZOFRAN) 2 mg/mL injection, 4 mg, Intravenous, Q6H PRN  PARoxetine (PAXIL) tablet, 20 mg, Oral, QAM  QUEtiapine (SEROQUEL) tablet, 200 mg, Oral, NIGHTLY  QUEtiapine (SEROQUEL) tablet, 100 mg, Oral, Daily after Breakfast  saccharomyces boulardii (FLORASTOR) capsule, 250 mg, Oral, Daily  traZODone (DESYREL) tablet, 200 mg, Oral, NIGHTLY        I/O last 24 hours:     Intake/Output Summary (Last 24 hours) at 07/30/2021 1259  Last data filed at 07/30/2021 0228  Gross per 24 hour   Intake 921 ml   Output --   Net 921 ml         Filed Vitals:    07/30/21 0734 07/30/21 0934 07/30/21 1041 07/30/21 1202   BP:  119/82     Pulse:  72     Resp: 16 16 16 16    Temp:  36.9 C (98.4 F)     SpO2:  100%       I have personally reviewed the vitals.      Physical Exam:    General: 46 year old, alert and oriented,  vitals signs reviewed   HEENT: NCAT, pupils equal, sclerae anicteric, mucus  membranes moist    Pulm: CTA bilaterally, no wheeze or rhonchi evident  Cardiac: RRR S1 S2, no murmur  GI: soft, BS x4 normal, non-tender, non-distended   MSK: Right ankle swelling remains but is improved, decreased RLE muscle tone, no deformity    Skin: no rash evident, warm and dry  Neurologic: grossly intact, no focal findings   Psych: normal mood, normal affect   Vascular: equal pulses, grossly normal    Labs: I personally reviewed all labs.   Results for orders placed or performed during the hospital encounter of 07/27/21 (from the past 24 hour(s))   VANCOMYCIN, TROUGH   Result Value Ref Range    VANCOMYCIN TROUGH 9.3 (L) 10.0 - 20.0 ug/mL    DOSE DATE 07/29/2021     DOSE TIME  3:00 PM    BASIC METABOLIC PANEL - AM ONCE   Result Value Ref Range    SODIUM 137 136 - 145 mmol/L    POTASSIUM 3.5 3.5 - 5.1 mmol/L    CHLORIDE 103 96 - 111 mmol/L    CO2 TOTAL  26 22 - 30 mmol/L    ANION GAP 8 4 - 13 mmol/L    CALCIUM 9.2 8.5 - 10.0 mg/dL    GLUCOSE 103 65 - 125 mg/dL    BUN 16 8 - 25 mg/dL    CREATININE 0.62 (L) 0.75 - 1.35 mg/dL    BUN/CREA RATIO 26 (H) 6 - 22    ESTIMATED GFR >90 >=60 mL/min/BSA   CBC WITH DIFF   Result Value Ref Range    WBC 10.4 4.5 - 11.0 x10^3/uL    RBC 4.41 (L) 4.70 - 5.40 x10^6/uL    HGB 11.6 (L) 13.5 - 18.0 g/dL    HCT 34.1 (L) 42.0 - 52.0 %    MCV 77.2 (L) 78.0 - 100.0 fL    MCH 26.2 (L) 28.0 - 33.0 pg    MCHC 33.9 32.0 - 37.0 g/dL    RDW 15.6 9.9 - 16.5 %    PLATELETS 465 (H) 130 - 400 x10^3/uL    NEUTROPHIL % 61 50 - 70 %    LYMPHOCYTE % 29 23 - 35 %    MONOCYTE % 6 0 - 12 %    EOSINOPHIL % 3 0 - 4 %    BASOPHIL % 1 0 - 1 %    NEUTROPHIL # 6.30 1.70 - 7.00 x10^3/uL    LYMPHOCYTE # 3.00 0.90 - 4.80 x10^3/uL    MONOCYTE # 0.70 0.30 - 0.90 x10^3/uL    EOSINOPHIL # 0.30 0.00 - 0.50 x10^3/uL    BASOPHIL # 0.10 0.00 - 0.30 x10^3/uL       Imaging: I personally reviewed all imaging.   XR ANKLE RIGHT   Final Result   Progressive erosive changes at the tibiotalar joint with collapse of the dome of the talus. The findings are suggestive of worsening bony changes from septic arthritis.                   Radiologist location ID: [id number]             I independently of the faculty provider spent a total of 30 minutes in direct/indirect care of this patient including initial evaluation, review of laboratory, radiology, diagnostic studies, review of medical record, order entry and coordination of care.     Hedy Jacob, NP     Patient seen and examined on rounds with Mrs. Hedy Jacob, NP as part of  a shared service with an APP.    My substantive findings are as detailed above.  PICC line established.  Continue with daptomycin as we could not get acceptable levels for vancomycin.  Continue with cefepime.    General:  Alert and oriented x3, well-developed well-nourished, no acute distress  HEENT:  Normal  Heart:  Regular rate and rhythm S1-S2 no  murmur  Lungs:  Clear to auscultation bilaterally  Abdomen:  Soft nontender nondistended bowel sounds x4 normal  Extremities:  Right ankle deformity and swelling less erythema today  Neurologic:  Cranial nerves 2-12 are intact, no focal sensory or motor deficit  Musculoskeletal:  Significant right ankle swelling, less erythema  Vascular:  Pulses equal upper and lower extremities  Psychiatric:  Normal affect  Skin:  Less erythema today at the ankle on the right    I independently of the APP spent a total of (20) minutes in direct/indirect care of this patient including initial evaluation, review of laboratory, radiology, diagnostic studies, review of medical record, order entry and coordination of care.     Chrissie Noa, DO [place]

## 2021-07-30 NOTE — Care Plan (Signed)
Patient is A&Ox4. Patient for a nuclear med scan for Monday. Double lumen PICC line to left upper arm, dressing changed 07/28/21. On IV antibiotics currently. Swelling to right foot and ankle 2-3+, slightly red and warm. Patient's personal items and call light within reach. Marva Panda, RN  07/30/2021, 18:27

## 2021-07-30 NOTE — Nurses Notes (Signed)
Patient c/o 9/10 pain after Norco PO. Patient is not able to get PO or IV pain meds at this time, as they are not due. Patient willing to try Motrin PO. Will continue to monitor. Marva Panda, RN  07/30/2021, 16:49

## 2021-07-30 NOTE — Care Plan (Signed)
Pt is alert and oriented, up independent in room using cane to ambulate. Medicated for pain as needed. Slept at short intervals. PICC to left upper arm is patent. Lungs are clear. The RLE is +2 and much less red, warm to touch. Able to make needs known. Call bell and personal items are in reach.

## 2021-07-31 DIAGNOSIS — M79661 Pain in right lower leg: Secondary | ICD-10-CM

## 2021-07-31 DIAGNOSIS — M4327 Fusion of spine, lumbosacral region: Secondary | ICD-10-CM

## 2021-07-31 LAB — BASIC METABOLIC PANEL
ANION GAP: 9 mmol/L (ref 4–13)
BUN/CREA RATIO: 26 — ABNORMAL HIGH (ref 6–22)
BUN: 18 mg/dL (ref 8–25)
CALCIUM: 9.3 mg/dL (ref 8.5–10.0)
CHLORIDE: 101 mmol/L (ref 96–111)
CO2 TOTAL: 26 mmol/L (ref 22–30)
CREATININE: 0.7 mg/dL — ABNORMAL LOW (ref 0.75–1.35)
ESTIMATED GFR: >90 mL/min/BSA (ref >=60)
GLUCOSE: 114 mg/dL (ref 65–125)
POTASSIUM: 3.2 mmol/L — ABNORMAL LOW (ref 3.5–5.1)
SODIUM: 136 mmol/L (ref 136–145)

## 2021-07-31 LAB — CBC WITH DIFF
BASOPHIL #: 0.1 10*3/uL (ref 0.00–0.30)
BASOPHIL %: 1 % (ref 0–1)
EOSINOPHIL #: 0.4 10*3/uL (ref 0.00–0.50)
EOSINOPHIL %: 4 % (ref 0–4)
HCT: 35.2 % — ABNORMAL LOW (ref 42.0–52.0)
HGB: 11.7 g/dL — ABNORMAL LOW (ref 13.5–18.0)
LYMPHOCYTE #: 3.1 10*3/uL (ref 0.90–4.80)
LYMPHOCYTE %: 32 % (ref 23–35)
MCH: 26 pg — ABNORMAL LOW (ref 28.0–33.0)
MCHC: 33.1 g/dL (ref 32.0–37.0)
MCV: 78.6 fL (ref 78.0–100.0)
MONOCYTE #: 0.9 10*3/uL (ref 0.30–0.90)
MONOCYTE %: 9 % (ref 0–12)
NEUTROPHIL #: 5.3 10*3/uL (ref 1.70–7.00)
NEUTROPHIL %: 54 % (ref 50–70)
PLATELETS: 439 10*3/uL — ABNORMAL HIGH (ref 130–400)
RBC: 4.48 10*6/uL — ABNORMAL LOW (ref 4.70–5.40)
RDW: 15.7 % (ref 9.9–16.5)
WBC: 9.9 10*3/uL (ref 4.5–11.0)

## 2021-07-31 LAB — HEPATITIS C VIRUS (HCV) RNA DETECTION AND QUANTIFICATION, PCR, PLASMA
HCV QUANTITATIVE PCR: 678000 IU/ML — ABNORMAL HIGH
HCV QUANTITATIVE RNA LOG: 5.83 LOG10 — ABNORMAL HIGH

## 2021-07-31 LAB — ADULT ROUTINE BLOOD CULTURE, SET OF 2 BOTTLES (BACTERIA AND YEAST): BLOOD CULTURE, ROUTINE: NO GROWTH

## 2021-07-31 MED ORDER — NICOTINE 14 MG/24 HR DAILY TRANSDERMAL PATCH
14.0000 mg | MEDICATED_PATCH | Freq: Every day | TRANSDERMAL | Status: DC
Start: 2021-07-31 — End: 2021-08-03
  Administered 2021-07-31 – 2021-08-01 (×2): 14 mg via TRANSDERMAL
  Administered 2021-08-02 – 2021-08-03 (×2): 0 mg via TRANSDERMAL
  Filled 2021-07-31 (×3): qty 1

## 2021-07-31 MED ORDER — POTASSIUM CHLORIDE ER 20 MEQ TABLET,EXTENDED RELEASE(PART/CRYST)
20.0000 meq | ORAL_TABLET | Freq: Two times a day (BID) | ORAL | Status: AC
Start: 2021-07-31 — End: 2021-07-31
  Administered 2021-07-31 (×2): 20 meq via ORAL
  Filled 2021-07-31 (×2): qty 1

## 2021-07-31 NOTE — Nurses Notes (Signed)
Reported by staff that the room smelled of cigarette smoke. Harl Bowie RN (Press photographer) went to the room, explained the safety risk of smoking in the facility. Pt verbalized understanding and gave the cigarettes and lighter to the charge nurse.

## 2021-07-31 NOTE — Progress Notes (Signed)
Patient was seen this morning for follow-up of the right ankle cellulitis with possible osteomyelitis, possible Charcot ankle.  Patient has no complaints at this time.  We discussed the findings of the aspiration of the fluid of the right ankle.  No growth at this time from the cultures.  I discussed the need for the ordered testing of the WBC labeled bone scan.  The patient understands the need for this to determine whether it is infection or Charcot ankle.  I discussed with the patient that once we get this results we will be able to determine a plan of action for care for his ankle.    Examination the patient reveals he is lying comfortably in bed in no apparent distress.  Examination of the right ankle reveals it has continued swelling with no signs of erythema at this time.  The ankle is still slightly increase in temp compared to the left.  Pain is still noted with range of motion and palpation of the area of the ankle.  Labs from 07/31/2021 at 6:53 a.m. show a WBC of 9.9, hemoglobin 11.7, hematocrit of 35.2, platelet count of 439, RBC of 4.48.  See the remainder labs for further values.  See culture results for values.    Osteomyelitis of the right ankle.  Cellulitis of the right lower extremity.  Charcot ankle right ankle.  Septic arthritis right ankle.  Severe degenerative changes right ankle.    Continue current treatment plan with the IV antibiotics for this patient.  Waiting on WBC labeled bone scan.  I will continue to follow this patient.  Determination of further treatment plans with results from bone scan.

## 2021-07-31 NOTE — Telemedicine Consult (Signed)
TELEMEDICINE INFECTIOUS DISEASE FOLLOW UP    Patient Name: Bryan Bryan [name] Number: [id number]  Date of Service: 07/31/2021  Date of Birth: February 10, 1976    Telemedicine Documentation:  Patient/family aware of provider location: yes  Patient/family consent for telemedicine: yes  Examination observed and performed by: Rennis Chris, APRN,NP-C & Dr. Gloriajean Dell   Patient Location: [facility],  [address], [place], [place] [address]    Bryan [name] Day:  LOS: 4 days     Requesting Service: [name] [place] HOSPITALIST  Requesting Physician: Bevely Palmer, DO    Reason for Consultation: Osteomyelitis     Subjective: Bryan Bryan [name] denies any new complaints today. Reports left ankle is less tender and swollen today. Does think the antibiotics are helping him. Denies any fevers or chills. Tolerating antibiotics well, no diarrhea or rash.     Objective:  Physical Exam:  Current Vitals: BP 112/64   Pulse 71   Temp 35.9 C (96.6 F)   Resp 20   Ht 1.829 m (6')   Wt 77.5 kg (170 lb 14.4 oz)   SpO2 97%   BMI 23.18 kg/m       Vitals in last 24 hours: Temp  Avg: 36.4 C (97.6 F)  Min: 35.9 C (96.6 F)  Max: 37.2 C (99 F)  MAP (Non-Invasive)  Avg: 96 mmHG  Min: 95 mmHG  Max: 97 mmHG  Pulse  Avg: 72  Min: 69  Max: 77  Resp  Avg: 17.3  Min: 16  Max: 20  SpO2  Avg: 99.3 %  Min: 97 %  Max: 100 %  General: No acute distress.  Non-toxic appearing.  Lungs: Unlabored respirations. On room air.   Extremities: Right ankle with mild edema.   Skin: multiple tattoos   Neurologic: Alert and oriented X 3.  Moves all extremities without focal deficit.  Psychiatric: Appropriate affect and behavior.  Intact memory.  Fluent speech.     Inpatient Medications:  acetaminophen (TYLENOL) tablet, 650 mg, Oral, Q4H PRN  alteplase (CATHFLO ACTIVASE) 2 mg/2 mL injection, 2 mg, Intra-Catheter, Daily PRN  cefepime (MAXIPIME) 2 g in NS 100 mL IVPB minibag, 2 g, Intravenous, Q12H  DAPTOmycin (CUBICIN) 450 mg in NS 50 mL IVPB, 6 mg/kg (Adjusted), Intravenous,  Q24H  enoxaparin PF (LOVENOX) 40 mg/0.4 mL SubQ injection, 40 mg, Subcutaneous, Q24H  HYDROcodone-acetaminophen (NORCO) 5-325 mg per tablet, 1 Tablet, Oral, Q4H PRN  ibuprofen (MOTRIN) tablet, 600 mg, Oral, Q6H PRN  influenza virus vaccine (PF) IM injection (FLUARIX for ages 6 months through adult), 0.5 mL, IntraMUSCULAR, Once  loperamide (IMODIUM) capsule, 2 mg, Oral, Q4H PRN  magnesium hydroxide (MILK OF MAGNESIA) 400mg  per 29mL oral liquid, 15 mL, Oral, Daily PRN  morphine 4 mg/mL injection, 4 mg, Intravenous, Q4H PRN  nicotine (NICODERM CQ) transdermal patch (mg/24 hr), 14 mg, Transdermal, Daily  NS flush syringe, 10 mL, Intravenous, Q8HRS  NS flush syringe, 20 mL, Intravenous, Q1 MIN PRN  ondansetron (ZOFRAN) 2 mg/mL injection, 4 mg, Intravenous, Q6H PRN  PARoxetine (PAXIL) tablet, 20 mg, Oral, QAM  potassium chloride (K-DUR) extended release tablet, 20 mEq, Oral, 2x/day-Food  QUEtiapine (SEROQUEL) tablet, 200 mg, Oral, NIGHTLY  QUEtiapine (SEROQUEL) tablet, 100 mg, Oral, Daily after Breakfast  saccharomyces boulardii (FLORASTOR) capsule, 250 mg, Oral, Daily  traZODone (DESYREL) tablet, 200 mg, Oral, NIGHTLY        Current Antimicrobials:  Antibiotics (From admission, onward)      Start     Stop Route Frequency  07/29/21 1700  DAPTOmycin (CUBICIN) 450 mg in NS 50 mL IVPB  (DAPTOmycin (CUBICIN) IVPB - Prosthetic joint, osteomyelitis, empiric Gram positive coverage with CK level )         -- IV EVERY 24 HOURS    07/28/21 1400  cefepime (MAXIPIME) 2 g in NS 100 mL IVPB minibag         -- IV EVERY 12 HOURS    07/27/21 0800  cefepime (MAXIPIME) 2 g in NS 100 mL IVPB minibag  Status:  Discontinued         03/31 1006 IV EVERY 12 HOURS    07/27/21 0500  vancomycin (VANCOCIN) 1,250 mg in NS 250 mL IVPB  (vancomycin pharmacy to dose & IVPB)  Status:  Discontinued         04/01 1513 IV EVERY 8 HOURS    07/27/21 0243  vancomycin (VANCOCIN) 1,000 mg solution ---Cabinet Override  Status:  Discontinued        Note to  Pharmacy: Quay Burow: cabinet override    03/30 [phone]               Lines:  Patient Lines/Drains/Airways Status       Active Line / Dialysis Catheter / Dialysis Graft / Drain / Airway / Wound       Name Placement date Placement time Site Days    PICC Double Lumen Left 07/28/21  1345  --  2    PICC Double Lumen Lumen 1 Purple Lumen 2 Red Left;Brachial Vein Central 07/28/21  1340  4 FR  2                     Laboratory Studies:  CBC Differential   Recent Labs     07/29/21  1009 07/30/21  0604 07/31/21  0653   WBC 13.4* 10.4 9.9   HGB 11.8* 11.6* 11.7*   HCT 35.8* 34.1* 35.2*   PLTCNT 521* 465* 439*    Recent Labs     07/29/21  1009 07/30/21  0604 07/31/21  0653   PMNS 67 61 54   LYMPHOCYTES 25 29 32   MONOCYTES 5 6 9    EOSINOPHIL 2 3 4    BASOPHILS 0  0.00 1  0.10 1  0.10   PMNABS 9.10* 6.30 5.30   LYMPHSABS 3.40 3.00 3.10   MONOSABS 0.70 0.70 0.90   EOSABS 0.20 0.30 0.40      BMP LFTs   Recent Labs     07/31/21  0653   SODIUM 136   POTASSIUM 3.2*   CHLORIDE 101   CO2 26   BUN 18   CREATININE 0.70*   GLUCOSENF 114   ANIONGAP 9   BUNCRRATIO 26*   GFR >90   CALCIUM 9.3    No results found for this encounter   CoAgs Blood Gas:   No results found for this encounter No results found for this encounter    Cardiac Markers Lipid Panel   No results for input(s): TROPONINI, CKMB, MBINDEX, BNP in the last 72 hours. No results found for this encounter   Urine Analysis Other Labs   No results found for this encounter No results found for this encounter    Invalid input(s): PRL     Microbiology:  Bryan [name] Encounter on 07/27/21 (from the past 96 hour(s))   [facility], AEROBIC - JOINT/SYNOVIAL FLUID    Collection Time: 07/28/21  6:21 PM    Specimen: Aspirate; Other  Culture Result Status    FLC No Growth 2 Days Preliminary    GRAM STAIN 4+ Many RBCs (A) Preliminary    GRAM STAIN 2+ Few WBCs (A) Preliminary    GRAM STAIN No Organisms Seen (A) Preliminary   FUNGUS CULTURE    Collection Time: 07/28/21   6:21 PM    Specimen: Aspirate; Other   Culture Result Status    FUNGAL CULTURE No Growth 2 Days Preliminary       No results found for any visits on 07/27/21 (from the past 24 hour(s)).    Imaging Studies:     Assessment:    Hildon Savoy is a 46 y.o., White male who presents with right ankle pain and swelling     He has PMH of anxiety/depression, COPD, and s/p L3-S1 PSF/fusion performed in Decemeber 2020 due to osteomyelitis/discitis s/p 6 weeks of treatment of serratia. Also has right ankle ORIF in 2009, partial hardware removal in 2011-2012, and right ankle surgery by local podiatrist due to ankle swelling.  On 07/27/21 he presents to [name] ER with complaints of right lower leg pain and swelling. He has been seen multiple times in the ER for these and has signed out AMA, but has recently been on clindamycin, Bactrim, and Keflex. MRI of the right ankle showed diffuse subcutaneous soft tissue swelling and joint effusion could be infectious vs inflammatory, no soft tissue abscess. XR of the right ankle showed progressive erosive changes at the tibiotalar joint with collapse of the dome of the talus. Findings are suggestive of worsening bony changes from septic arthritis. Tox screen positive on admission. Podiatry has been consulted. ID consulted for osteomyelitis of the right ankle. He is s/p aspiration of right ankle with no purulent fluid noted on 3/31. No growth x 2 days. Planning for WBC tagged scan.     Recommendations:    - Will recommend continuing Daptomycin 6mg /kg + cefepime 2g every 12 hours x 14 days from 07/27/20 Day #4/14  - Then transition to PO Linezolid 600 mg BID + Levaquin 750 mg daily x 2-4 weeks based on CRP. This will complete a total of 4-6 weeks for osteomyelitis   - Hepatitis C treatment as outpatient - consider referring to ID Hep C clinic [facility])  -  Please continue to follow CBC with differential, BUN, creatinine, hepatic function panel, and CRP while the patient remains on  antimicrobial therapy.    ID will sign off at this time.  Please call or page the Infectious Diseases Service with any questions regarding this patient.    I independently of the faculty provider spent a total of (20) minutes in direct/indirect care of this patient including initial evaluation, review of laboratory, radiology, diagnostic studies, review of medical record, order entry and coordination of care.    Rennis Chris, APRN, NP-C  Infectious Diseases     I personally saw and evaluated the patient as part of a shared service with an APP.      My substantive findings are:    Ankle OM/Septic arthritis, responding to antibiotics.  No positive microbial cultures to guide therapy.  Will treat with combination IV/PO antibiotics - needs weekly monitoring of labs including CRP    I independently of the APP spent a total of 15 minutes in direct/indirect care of this patient including initial evaluation, review of laboratory, radiology, diagnostic studies, review of medical record, order entry and coordination of care.       Henley Blyth R. Gloriajean Dell MD  Pager [phone]  07/31/2021

## 2021-07-31 NOTE — Progress Notes (Signed)
Hospitalist Progress Note    Bryan [name]       46 y.o.       Date of service: 07/31/2021  Date of Admission:  07/27/2021    Hospital Day:  LOS: 4 days       Assessment/Plan:   Active Hospital Problems    Diagnosis   . Primary Problem: Osteomyelitis (CMS HCC)   . Tobacco use disorder     1. Septic right ankle arthritis/osteomyelitis/charcot arthropathy considered: Sterile site culture with no growth day 3.  White blood cell count of 9.9.  Afebrile.  Continues on broad-spectrum antibiotic with cefepime day 4.  Daptomycin day 1.  Awaiting NUC infection imaging.  Podiatry is consulted and appreciate input. Hx of fracture and repair.       2. Tobacco abuse disorder:  Encouraged cessation, nicotine replacement therapy offered.    3. Mood Disorder: continue home medications.       DVT/PE Prophylaxis: Enoxaparin      Disposition Planning: Home discharge        CC: Osteomyelitis (CMS HCC)      Subjective:  This is a 46 year old male who initially presented with pain.  Patient denies chest pain, SOB, abdominal pain, fever, chills, nausea or vomiting.  Continues to have right ankle pain.    Medications reviewed.  acetaminophen (TYLENOL) tablet, 650 mg, Oral, Q4H PRN  alteplase (CATHFLO ACTIVASE) 2 mg/2 mL injection, 2 mg, Intra-Catheter, Daily PRN  cefepime (MAXIPIME) 2 g in NS 100 mL IVPB minibag, 2 g, Intravenous, Q12H  DAPTOmycin (CUBICIN) 450 mg in NS 50 mL IVPB, 6 mg/kg (Adjusted), Intravenous, Q24H  enoxaparin PF (LOVENOX) 40 mg/0.4 mL SubQ injection, 40 mg, Subcutaneous, Q24H  HYDROcodone-acetaminophen (NORCO) 5-325 mg per tablet, 1 Tablet, Oral, Q4H PRN  ibuprofen (MOTRIN) tablet, 600 mg, Oral, Q6H PRN  influenza virus vaccine (PF) IM injection (FLUARIX for ages 6 months through adult), 0.5 mL, IntraMUSCULAR, Once  loperamide (IMODIUM) capsule, 2 mg, Oral, Q4H PRN  magnesium hydroxide (MILK OF MAGNESIA) 400mg  per 5mL oral liquid, 15 mL, Oral, Daily PRN  morphine 4 mg/mL injection, 4 mg, Intravenous, Q4H  PRN  nicotine (NICODERM CQ) transdermal patch (mg/24 hr), 14 mg, Transdermal, Daily  NS flush syringe, 10 mL, Intravenous, Q8HRS  NS flush syringe, 20 mL, Intravenous, Q1 MIN PRN  ondansetron (ZOFRAN) 2 mg/mL injection, 4 mg, Intravenous, Q6H PRN  PARoxetine (PAXIL) tablet, 20 mg, Oral, QAM  potassium chloride (K-DUR) extended release tablet, 20 mEq, Oral, 2x/day-Food  QUEtiapine (SEROQUEL) tablet, 200 mg, Oral, NIGHTLY  QUEtiapine (SEROQUEL) tablet, 100 mg, Oral, Daily after Breakfast  saccharomyces boulardii (FLORASTOR) capsule, 250 mg, Oral, Daily  traZODone (DESYREL) tablet, 200 mg, Oral, NIGHTLY        I/O last 24 hours:     Intake/Output Summary (Last 24 hours) at 07/31/2021 0944  Last data filed at 07/31/2021 0800  Gross per 24 hour   Intake 560 ml   Output --   Net 560 ml         Filed Vitals:    07/30/21 2119 07/30/21 2313 07/31/21 0449 07/31/21 0542   BP:   112/64    Pulse:   71    Resp: 20 18 18 20    Temp:   35.9 C (96.6 F)    SpO2:   97%      I have personally reviewed the vitals.      Physical Exam:    General:, NAD, vitals signs reviewed   HEENT: NCAT, pupils  equal, sclerae anicteric, mucus membranes moist    Pulm: CTA bilaterally, no wheeze or rhonchi evident  Cardiac: RRR S1 S2, no murmur  GI: soft, BS x4 normal, non-tender, non-distended   MSK:  Right ankle with edema, there is old surgical scar, no erythema noted.    Skin: no rash evident, warm and dry  Neurologic: grossly intact, no focal findings   Psych: normal mood, normal affect   Vascular: equal pulses, grossly normal    Labs: I personally reviewed all labs.   Results for orders placed or performed during the hospital encounter of 07/27/21 (from the past 24 hour(s))   BASIC METABOLIC PANEL - AM ONCE   Result Value Ref Range    SODIUM 136 136 - 145 mmol/L    POTASSIUM 3.2 (L) 3.5 - 5.1 mmol/L    CHLORIDE 101 96 - 111 mmol/L    CO2 TOTAL 26 22 - 30 mmol/L    ANION GAP 9 4 - 13 mmol/L    CALCIUM 9.3 8.5 - 10.0 mg/dL    GLUCOSE 734 65 - 193 mg/dL     BUN 18 8 - 25 mg/dL    CREATININE 7.90 (L) 0.75 - 1.35 mg/dL    BUN/CREA RATIO 26 (H) 6 - 22    ESTIMATED GFR >90 >=60 mL/min/BSA   CBC WITH DIFF   Result Value Ref Range    WBC 9.9 4.5 - 11.0 x10^3/uL    RBC 4.48 (L) 4.70 - 5.40 x10^6/uL    HGB 11.7 (L) 13.5 - 18.0 g/dL    HCT 24.0 (L) 97.3 - 52.0 %    MCV 78.6 78.0 - 100.0 fL    MCH 26.0 (L) 28.0 - 33.0 pg    MCHC 33.1 32.0 - 37.0 g/dL    RDW 53.2 9.9 - 99.2 %    PLATELETS 439 (H) 130 - 400 x10^3/uL    NEUTROPHIL % 54 50 - 70 %    LYMPHOCYTE % 32 23 - 35 %    MONOCYTE % 9 0 - 12 %    EOSINOPHIL % 4 0 - 4 %    BASOPHIL % 1 0 - 1 %    NEUTROPHIL # 5.30 1.70 - 7.00 x10^3/uL    LYMPHOCYTE # 3.10 0.90 - 4.80 x10^3/uL    MONOCYTE # 0.90 0.30 - 0.90 x10^3/uL    EOSINOPHIL # 0.40 0.00 - 0.50 x10^3/uL    BASOPHIL # 0.10 0.00 - 0.30 x10^3/uL       Imaging: I personally reviewed all imaging.   XR ANKLE RIGHT   Final Result   Progressive erosive changes at the tibiotalar joint with collapse of the dome of the talus. The findings are suggestive of worsening bony changes from septic arthritis.                   Radiologist location ID: [id number]             I independently of the faculty provider spent a total of (15) minutes in direct/indirect care of this patient including initial evaluation, review of laboratory, radiology, diagnostic studies, review of medical record, order entry and coordination of care.      Herschell Dimes, NP     I personally saw and evaluated the patient as part of a shared service with an APP.    My substantive findings are:  MDM (complete) seen and examined; tells me "ankle is back at baseline"; always looks like this he says; continue Lovenox; smoking cessation  encouraged; no growth sterild cultures at 3 days; tagged wbc scan tomorrow   I independently of the APP spent a total of (16) minutes in direct/indirect care of this patient including initial evaluation, review of laboratory, radiology, diagnostic studies, review of medical record, order  entry and coordination of care.     Lamont Snowball, DO

## 2021-07-31 NOTE — Care Plan (Signed)
07/31/21 1036   Rehab Session   Document Type evaluation   Total PT Minutes: 15   Patient Effort good   General Information   Patient Profile Reviewed yes   Onset of Illness/Injury or Date of Surgery   (osteomyelitis right ankle)   Medical Lines PIV Line   Respiratory Status room air   Living Environment   Lives With parent(s)   Living Arrangements house   Home Assessment: No Problems Identified   Functional Level Prior   Ambulation 0 - independent   Transferring 0 - independent   Toileting 0 - independent   Bathing 0 - independent   Dressing 0 - independent   Eating 0 - independent   Communication 0 - understands/communicates without difficulty   Swallowing 0-->swallows foods/liquids without difficulty   Pre Treatment Status   Pre Treatment Patient Status Patient supine in bed   Cognitive Assessment/Interventions   Behavior/Mood Observations behavior appropriate to situation, WNL/WFL   Orientation Status oriented x 4   Attention WNL/WFL   Follows Commands WNL   Pain Assessment   Pretreatment Pain Rating 8/10   Posttreatment Pain Rating 8/10   Pain Location - Side Right   Pain Location ankle   Bed Mobility Assessment/Treatment   Supine-Sit Independence independent   Sit to Supine, Independence independent   Transfer Assessment/Treatment   Sit-Stand Independence independent   Stand-Sit Independence independent   Gait Assessment/Treatment   Independence  stand-by assistance   Distance in Feet 60 feet   Balance Skill Training   Sitting Balance: Static good balance   Sitting, Dynamic (Balance) good balance   Sit-to-Stand Balance good balance   Standing Balance: Static good balance   Standing Balance: Dynamic good balance   Post Treatment Status   Post Treatment Patient Status Patient supine in bed;Call light within reach;Telephone within reach   Plan of Care Review   Plan Of Care Reviewed With patient   Physical Therapy Clinical Impression   Rehab Potential good   Therapy Frequency minimum of 2x/week   Predicted  Duration of Therapy Intervention (days/wks) length of stay   Care Plan Goals   PT Rehab Goals Gait Training Goal   Gait Training  Goal, Distance to Achieve   Gait Training  Goal, Independence Level independent   Gait Training  Goal, Assist Device least restricted assistive device   Gait Training  Goal, Distance to Achieve 100 feet   Planned Therapy Interventions, PT Eval   Planned Therapy Interventions (PT) balance training;gait training;patient/family education

## 2021-07-31 NOTE — Care Plan (Signed)
Alert and oriented x4. Showered this evening. Up independently. Taking pain med q2hr, states pain is less than yesterday. Pain ranges from 9/10 to 7/10. PICC line intact left upper line. Reports numbness right toes and plantar area. Call light with in reach. Will continue to monitor

## 2021-08-01 ENCOUNTER — Other Ambulatory Visit: Payer: Self-pay

## 2021-08-01 ENCOUNTER — Inpatient Hospital Stay (HOSPITAL_COMMUNITY): Payer: Medicare Other

## 2021-08-01 DIAGNOSIS — F39 Unspecified mood [affective] disorder: Secondary | ICD-10-CM

## 2021-08-01 DIAGNOSIS — B1920 Unspecified viral hepatitis C without hepatic coma: Secondary | ICD-10-CM

## 2021-08-01 DIAGNOSIS — M009 Pyogenic arthritis, unspecified: Secondary | ICD-10-CM

## 2021-08-01 DIAGNOSIS — F17210 Nicotine dependence, cigarettes, uncomplicated: Secondary | ICD-10-CM

## 2021-08-01 DIAGNOSIS — M869 Osteomyelitis, unspecified: Secondary | ICD-10-CM

## 2021-08-01 DIAGNOSIS — M14671 Charcot's joint, right ankle and foot: Secondary | ICD-10-CM

## 2021-08-01 LAB — BASIC METABOLIC PANEL
ANION GAP: 10 mmol/L (ref 4–13)
BUN/CREA RATIO: 29 — ABNORMAL HIGH (ref 6–22)
BUN: 22 mg/dL (ref 8–25)
CALCIUM: 9.6 mg/dL (ref 8.5–10.0)
CHLORIDE: 103 mmol/L (ref 96–111)
CO2 TOTAL: 26 mmol/L (ref 22–30)
CREATININE: 0.76 mg/dL (ref 0.75–1.35)
ESTIMATED GFR: >90 mL/min/BSA (ref >=60)
GLUCOSE: 77 mg/dL (ref 65–125)
POTASSIUM: 3.9 mmol/L (ref 3.5–5.1)
SODIUM: 139 mmol/L (ref 136–145)

## 2021-08-01 LAB — HEPATIC FUNCTION PANEL
ALBUMIN: 3.1 g/dL — ABNORMAL LOW (ref 3.5–5.0)
ALKALINE PHOSPHATASE: 79 U/L (ref 45–115)
ALT (SGPT): 23 U/L (ref 10–55)
AST (SGOT): 13 U/L (ref 8–45)
BILIRUBIN DIRECT: 0.1 mg/dL (ref 0.1–0.4)
BILIRUBIN TOTAL: 0.2 mg/dL — ABNORMAL LOW (ref 0.3–1.3)
PROTEIN TOTAL: 8.1 g/dL (ref 6.4–8.3)

## 2021-08-01 LAB — CBC
HCT: 37 % — ABNORMAL LOW (ref 42.0–52.0)
HGB: 12 g/dL — ABNORMAL LOW (ref 13.5–18.0)
MCH: 25.3 pg — ABNORMAL LOW (ref 28.0–33.0)
MCHC: 32.4 g/dL (ref 32.0–37.0)
MCV: 78.3 fL (ref 78.0–100.0)
MPV: 7 fL
PLATELETS: 469 10*3/uL — ABNORMAL HIGH (ref 130–400)
RBC: 4.72 10*6/uL (ref 4.70–5.40)
RDW: 15.8 % (ref 9.9–16.5)
WBC: 12.7 10*3/uL — ABNORMAL HIGH (ref 4.5–11.0)

## 2021-08-01 LAB — C-REACTIVE PROTEIN(CRP),INFLAMMATION: CRP INFLAMMATION: 8 mg/L — ABNORMAL HIGH (ref <8.0)

## 2021-08-01 MED ORDER — SODIUM CHLORIDE 0.9 % INTRAVENOUS PIGGYBACK
2.0000 g | Freq: Two times a day (BID) | INTRAVENOUS | Status: AC
Start: 2021-08-02 — End: 2021-08-07

## 2021-08-01 MED ORDER — SODIUM CHLORIDE 0.9 % INTRAVENOUS SOLUTION
6.0000 mg/kg | INTRAVENOUS | Status: AC
Start: 2021-08-01 — End: 2021-08-12

## 2021-08-01 MED ORDER — SACCHAROMYCES BOULARDII 250 MG CAPSULE
250.0000 mg | ORAL_CAPSULE | Freq: Every day | ORAL | Status: DC
Start: 2021-08-02 — End: 2022-01-03

## 2021-08-01 MED ORDER — SODIUM CHLORIDE 0.9 % INTRAVENOUS PIGGYBACK
2.0000 g | Freq: Two times a day (BID) | INTRAVENOUS | Status: DC
Start: 2021-08-01 — End: 2021-08-03
  Administered 2021-08-01: 0 g via INTRAVENOUS
  Administered 2021-08-01 – 2021-08-02 (×3): 2 g via INTRAVENOUS
  Administered 2021-08-02 (×2): 0 g via INTRAVENOUS
  Administered 2021-08-02 – 2021-08-03 (×2): 2 g via INTRAVENOUS
  Administered 2021-08-03 (×2): 0 g via INTRAVENOUS
  Filled 2021-08-01 (×7): qty 12.5

## 2021-08-01 MED ORDER — SODIUM CHLORIDE 0.9 % INTRAVENOUS SOLUTION
6.0000 mg/kg | INTRAVENOUS | Status: DC
Start: 2021-08-01 — End: 2021-08-03
  Administered 2021-08-01: 450 mg via INTRAVENOUS
  Administered 2021-08-01: 0 mg via INTRAVENOUS
  Administered 2021-08-02: 450 mg via INTRAVENOUS
  Administered 2021-08-02: 0 mg via INTRAVENOUS
  Filled 2021-08-01 (×3): qty 9

## 2021-08-01 MED ORDER — LEVOFLOXACIN 500 MG TABLET
500.0000 mg | ORAL_TABLET | Freq: Every day | ORAL | 0 refills | Status: AC
Start: 2021-08-12 — End: 2021-08-26

## 2021-08-01 MED ORDER — NICOTINE 14 MG/24 HR DAILY TRANSDERMAL PATCH
14.0000 mg | MEDICATED_PATCH | Freq: Every day | TRANSDERMAL | Status: DC
Start: 2021-08-02 — End: 2022-01-03

## 2021-08-01 MED ORDER — QUETIAPINE 100 MG TABLET
100.0000 mg | ORAL_TABLET | Freq: Every morning | ORAL | Status: DC
Start: 2021-08-02 — End: 2021-12-30

## 2021-08-01 MED ORDER — LINEZOLID 600 MG TABLET
600.0000 mg | ORAL_TABLET | Freq: Two times a day (BID) | ORAL | 0 refills | Status: AC
Start: 2021-08-12 — End: 2021-08-26

## 2021-08-01 NOTE — Care Management Notes (Addendum)
Spoke with patient this morning regarding SNF for 10 days of IV abx. Patient has no preferences at this time.. freedom of choice form completed with no preference listed , allscripts referral sent to [facility] for review , patient has limited SNF to be referred to secondary to past drug use waiting on a response at this time .  Blain Pais, RN  08/01/2021, 11:18      [facility] has denied patient secondary to positive drug screen  As well as all [facility] , CM has reached out to [facility] and spoke with Howard Pouch admissions coordinator  Regarding possible acceptance. Victorino Dike will review information and give CM a call back as to whether they can accept or not . Per Victorino Dike with  [facility] patient can be accepted today  Or whenever patient is discharge ready  a [place] PASRR completed . When patient is discharge ready please call report and fax discharge summary to     [phone]  Fax [phone]     Blain Pais, RN  08/01/2021, 12:50

## 2021-08-01 NOTE — Care Plan (Signed)
Patient is alert and oriented, up independently in room. Cane and walking boot in room. Patient does endorse pain to right foot, medicated per MAR. Call bell in reach, patient is able to make needs known, will continue to monitor. Patient safety maintained.

## 2021-08-01 NOTE — Care Plan (Signed)
08/01/21 1136   Rehab Session   Document Type therapy progress note (daily note)   Total PT Minutes: 15   Patient Effort good   General Information   Patient Profile Reviewed yes   Respiratory Status room air   Pre Treatment Status   Pre Treatment Patient Status Patient supine in bed   Cognitive Assessment/Interventions   Behavior/Mood Observations behavior appropriate to situation, WNL/WFL   Pain Assessment   Pretreatment Pain Rating 7/10   Posttreatment Pain Rating 7/10   Pain Location - Side Right   Pain Location ankle   Bed Mobility Assessment/Treatment   Supine-Sit Independence independent   Sit to Supine, Independence independent   Transfer Assessment/Treatment   Sit-Stand Independence independent   Stand-Sit Independence independent   Gait Assessment/Treatment   Independence  independent   Distance in Feet 60 feet   Balance Skill Training   Sitting Balance: Static good balance   Sitting, Dynamic (Balance) good balance   Sit-to-Stand Balance good balance   Standing Balance: Static good balance   Standing Balance: Dynamic good balance   Post Treatment Status   Post Treatment Patient Status Patient sitting on edge of bed   Plan of Care Review   Plan Of Care Reviewed With patient

## 2021-08-01 NOTE — Care Plan (Signed)
Assessment completed per flow sheet. Alert and oriented x4. Affect appropriate. Lungs clear bilaterally, currently on RA. Heart regular rate and rhythm, no murmur noted. Abdomen soft and non-tender, bowl sounds x4. No difficulty with bowel or bladder. Skin warm and dry. Peripheral pulses palpable, R ankle and foot 2+ edema. PICC to L upper arm intact, red port flushes and draws blood, purple port occluded. One round of cath flo attempted with no result.  Patient has been medicated for pain several times throughout the shift, alternating between Norco and morphine. Patient has a nuclear med Research officer, [employer]. No other needs voiced at this time. Personal items and call bell within reach, able to make needs known. Safety measures maintained.    Gerre Pebbles, RN  08/01/2021, 18:35

## 2021-08-01 NOTE — Nurses Notes (Signed)
Per case management, patient was accepted to [facility] for SNF for continuation of IV ABX. This nurse went to inform patient of acceptance to which the patient replied "I'm not going." This patient states he does not understand why this was set up because "a doctor came in earlier to say I didn't need to go anywhere since I only have 8 days left, then they would switch me to oral ABX." This nurse explained the full course of IV ABX would be best with his type of infection. Patient stated he wanted to speak to a provider and his ID team to formulate a plan.   This nurse reached out to V.Lohr with case management to inform her of patient's decline for SNF. V.Lohr advised this nurse to speak Dr.Evick and S.Claybon Jabs, NP. Dr.Evick arrived on the floor at the same time and was updated on the situation.   Dr.Evick went back to speak to the patient and updated this nurse that he is "thinking about it".   [address]Cl[address]bon Jabs, NP informed this nurse patient could stay until Thursday to complete the nuclear medicine test.  Patient came to nurse's station asking to speak to someone regarding a plan and his ride is on his way so he may be leaving.    Danella Deis, RN  08/01/2021, 15:11

## 2021-08-01 NOTE — Discharge Summary (Signed)
[facility]  DISCHARGE SUMMARY    PATIENT NAME:  Bryan [name], Bryan [name]  MRN:  [id number]  DOB:  [birth year]    ENCOUNTER DATE:  07/27/2021  INPATIENT ADMISSION DATE: 07/27/2021  DISCHARGE DATE:  08/03/2021    ATTENDING PHYSICIAN: Lamont SnowballEvick, Jamie, DO  [facility]  PRIMARY CARE PHYSICIAN: No Pcp       No lay caregiver identified.      PRIMARY DISCHARGE DIAGNOSIS: Osteomyelitis (CMS [facility])  Active Hospital Problems    Diagnosis Date Noted   . Principal Problem: Osteomyelitis (CMS HCC) [M86.9] 07/27/2021   . Tobacco use disorder [F17.200] 07/28/2021      Resolved Hospital Problems   No resolved problems to display.     There are no active non-hospital problems to display for this patient.          Current Discharge Medication List      START taking these medications.      Details   cefepime 2 g in NS 100 mL infusion   2 g, Intravenous, EVERY 12 HOURS, Mix and infuse per policy of [facility].  Refills: 0     DAPTOmycin 450 mg in NS 59 mL infusion   6 mg/kg (450 mg), Intravenous, EVERY 24 HOURS, Mix and infuse per policy of [facility].  Refills: 0     levoFLOXacin 500 mg Tablet  Commonly known as: LEVAQUIN  Start taking on: August 12, 2021   500 mg, Oral, DAILY  Qty: 14 Tablet  Refills: 0     linezolid 600 mg Tablet  Commonly known as: ZYVOX  Start taking on: August 12, 2021   600 mg, Oral, 2 TIMES DAILY  Qty: 28 Tablet  Refills: 0     nicotine 14 mg/24 hr Patch 24 hr  Commonly known as: NICODERM CQ   14 mg, Transdermal, DAILY  Refills: 0     Saccharomyces boulardii 250 mg Capsule  Commonly known as: FLORASTOR   250 mg, Oral, DAILY  Refills: 0        CONTINUE these medications which have CHANGED during your visit.      Details   HYDROcodone-acetaminophen 5-325 mg Tablet  Commonly known as: NORCO  What changed:    when to take this   reasons to take this   1 Tablet, Oral, EVERY 4 HOURS PRN  Qty: 13 Tablet  Refills: 0     * QUEtiapine 200 mg Tablet  Commonly known as: SEROQUEL  What  changed:    Another medication with the same name was added. Make sure you understand how and when to take each.   Another medication with the same name was removed. Continue taking this medication, and follow the directions you see here.   200 mg, Oral, NIGHTLY  Refills: 0     * QUEtiapine 100 mg Tablet  Commonly known as: SEROQUEL  What changed: You were already taking a medication with the same name, and this prescription was added. Make sure you understand how and when to take each.  Replaces: QUEtiapine 150 mg Tablet Sustained Release 24 hr   100 mg, Oral, EVERY MORNING AFTER BREAKFAST  Refills: 0         * This list has 2 medication(s) that are the same as other medications prescribed for you. Read the directions carefully, and ask your doctor or other care provider to review them with you.            CONTINUE these medications -  NO CHANGES were made during your visit.      Details   acetaminophen 325 mg Tablet  Commonly known as: TYLENOL   325 mg, Oral, EVERY 4 HOURS PRN  Refills: 0     PARoxetine 20 mg Tablet  Commonly known as: PAXIL   20 mg, Oral, EVERY MORNING  Refills: 0     traZODone 150 mg Tablet  Commonly known as: DESYREL   150 mg, NIGHTLY  Refills: 0        STOP taking these medications.    Ibuprofen 800 mg Tablet  Commonly known as: MOTRIN     QUEtiapine 150 mg Tablet Sustained Release 24 hr  Commonly known as: SEROQUEL XR  Replaced by: QUEtiapine 100 mg Tablet  You also have another medication with the same name that you need to continue taking as instructed.          Discharge med list refreshed?  YES     Allergies   Allergen Reactions   . Latex Rash   . Adhesive Rash   . Tramadol Rash   . Meloxicam    . Vistaril [Hydroxyzine Hcl] Swelling     HOSPITAL PROCEDURE(S):   Orders Placed This Encounter   Procedures   . BEDSIDE  JOINT RELATED PROCEDURE       GENERAL: This is a 45 year oldmalein no acute distress,   EYES: Sclera anicteric  HEENT: Head atraumatic, normocephalic, oral mucosa  moist  HEART: Regular rate and rhythm,no murmurs  LUNGS: CTA bilaterally, no wheezes, rhonchi, or rales  ABDOMEN: Soft, nontender, positive bowel sounds  EXTREMITY: No peripheral edema  Skin: Warm, Dry and intact   MUSCULOSKELETAL:  Right leg swelling no redness observed.    NEURO: Alert and oriented x 3, CN II-XII grossly intact, moves all extremities, sensation all extremities    REASON FOR HOSPITALIZATION AND HOSPITAL COURSE   BRIEF HPI:  This is a 46 y.o., male admitted for [facility] for right ankle pain and swelling for "a long time" now. Patient states that he has had trouble with his ankle in the past but has left the ER because it was taking too long. Patient does state that has been on antibiotics before but is unable to tell me which ones. At [facility] the patient was found to be anemic with a HGB 10.3, HCT 31.3, RBC 3.96, MCV 78.4, which does appear to be his baseline. His Sed rate was elevated at 74, CRP of 71.7, lactic of 2.8 which did trend down. Patient was transferred to [facility] for ortho consult after MRI of right ankle showed osteomyelitis. Patient denies any fevers at home. No chest pain or shortness of breath. Patient denies illegal drug use, however, urine drug screen at [facility] is positive for amphetamines, methamphetamines, and cannabinoids.     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  He was treated with IV antibiotics with IV cefepime and vancomycin initially. Transitioned to daptomycin.  CK was WNL.  He had joint aspiration with out growth to date.  ID was consulted and recommended two weeks of Cefepime and Daptomycin with stop date of 4/14 than transition to oral Linezolid and Levaquin for 2-4 weeks pending trend of CRP.  He had a PICC line place.  SNF disposition recommended.  Podiatry was consulted.  Nuc infection imaging was completed and results pending.  Per podiatry pending  results may consider bone biopsy. He was having anxiety,  psychiatry was consulted and appreciate input.  We could add Seroquel 25 mg prn bid.  Continue Paxil and trazodone.    He will need to follow up with Dr. Geraldo Pitter in one week after discharge.      TRANSITION/POST DISCHARGE CARE/PENDING TESTS/REFERRALS:   PCP after SNF     [facility] 1 week     Outpatient Hepatitis C clinic consider referral     Complete course of IV antibiotics, transition to oral antibiotics.  Prescriptions provided for oral antibiotic transition.      CONDITION ON DISCHARGE:  A. Ambulation: Ambulation with assistive device uses cane  B. Self-care Ability: With partial assistance  C. Cognitive Status Alert and Oriented x 3  D. Code status at discharge:       LINES/DRAINS/WOUNDS AT DISCHARGE:   Patient Lines/Drains/Airways Status     Active Line / Dialysis Catheter / Dialysis Graft / Drain / Airway / Wound     Name Placement date Placement time Site Days    PICC Double Lumen Left 07/28/21  1345  --  5    PICC Double Lumen Lumen 1 Purple Lumen 2 Red Left;Brachial Vein Central 07/28/21  1340  4 FR  5                DISCHARGE DISPOSITION:  Skilled Nursing Unit  DISCHARGE INSTRUCTIONS:  Post-Discharge Follow Up Appointments     Follow up with Center, [facility], DPM    Phone: [phone]    Where: [address], [place] [address]    Follow up with Pcp, No in 1 week(s)    Wednesday Aug 02, 2021    [facility] NM INFECTION SCAN with [facility] NM 1 at 12:00 PM    Thursday Aug 03, 2021    [facility] NM INFECTION SCAN with [facility] NM 1 at 12:30 PM    Friday Aug 04, 2021    [facility] NM INFECTION SCAN with [facility] NM 1 at 11:30 AM    [facility] - Nuclear Medicine  [facility]  [address]  [place] [address]  [phone]           CBC     Release to patient Automated      COMPREHENSIVE METABOLIC PANEL, NON-FASTING     Release to patient Automated      C-REACTIVE PROTEIN(CRP),INFLAMMATION     Release to patient Automated      CREATINE KINASE (CK),  TOTAL, SERUM     Release to patient Automated      DISCHARGE INSTRUCTION - DIET     Diet: RESUME HOME DIET         I independently of the faculty provider spent a total of (22) minutes in direct/indirect care of this patient including initial evaluation, review of laboratory, radiology, diagnostic studies, review of medical record, order entry and coordination of care.    Herschell Dimes, NP    Copies sent to Care Team       Relationship Specialty Notifications Start End    Pcp, No PCP - General   06/07/20           Referring providers can utilize [email or web address] to access their referred [facility] patient's information.                  I personally saw and examined the patient. See Midlevel's note for additional details.    Lamont Snowball, DO 08/03/2021, 11:36

## 2021-08-01 NOTE — Progress Notes (Signed)
Hospitalist Progress Note    Evart Mcdonnell       46 y.o.       Date of service: 08/01/2021  Date of Admission:  07/27/2021    Hospital Day:  LOS: 5 days       Assessment/Plan:   Active Hospital Problems    Diagnosis   . Primary Problem: Osteomyelitis (CMS HCC)   . Tobacco use disorder     1. 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  White blood cell count of 12.7.  Afebrile.  Continues on broad-spectrum antibiotic with cefepime day 4.  Daptomycin day 2, monitor weekly CK.  Awaiting NUC infection imaging.  Podiatry is consulted and appreciate input. Hx of fracture and repair. ID consulted recommended Daptomycin and Cefepime for total of 14 days (4/14) than transition to oral Linezolid and Levaquin for 2-4 weeks.  PT consulted.      2. Tobacco abuse disorder:  Encouraged cessation, nicotine replacement therapy offered.    3. Mood Disorder: continue home medications.    4. Hepatitis C: outpatient treatment.         DVT/PE Prophylaxis: Enoxaparin      Disposition Planning: Home discharge        CC: Osteomyelitis (CMS HCC)      Subjective:  This is a 46 year old male who initially presented with pain.  Patient denies chest pain, SOB, abdominal pain, fever, chills, nausea or vomiting.  Continues to have right ankle pain.    Medications reviewed.  acetaminophen (TYLENOL) tablet, 650 mg, Oral, Q4H PRN  alteplase (CATHFLO ACTIVASE) 2 mg/2 mL injection, 2 mg, Intra-Catheter, Daily PRN  cefepime (MAXIPIME) 2 g in NS 100 mL IVPB minibag, 2 g, Intravenous, Q12H  DAPTOmycin (CUBICIN) 450 mg in NS 50 mL IVPB, 6 mg/kg (Adjusted), Intravenous, Q24H  enoxaparin PF (LOVENOX) 40 mg/0.4 mL SubQ injection, 40 mg, Subcutaneous, Q24H  HYDROcodone-acetaminophen (NORCO) 5-325 mg per tablet, 1 Tablet, Oral, Q4H PRN  ibuprofen (MOTRIN) tablet, 600 mg, Oral, Q6H PRN  influenza virus vaccine (PF) IM injection (FLUARIX for ages 6 months through adult), 0.5 mL,  IntraMUSCULAR, Once  loperamide (IMODIUM) capsule, 2 mg, Oral, Q4H PRN  magnesium hydroxide (MILK OF MAGNESIA) 400mg  per 41mL oral liquid, 15 mL, Oral, Daily PRN  morphine 4 mg/mL injection, 4 mg, Intravenous, Q4H PRN  nicotine (NICODERM CQ) transdermal patch (mg/24 hr), 14 mg, Transdermal, Daily  NS flush syringe, 10 mL, Intravenous, Q8HRS  NS flush syringe, 20 mL, Intravenous, Q1 MIN PRN  ondansetron (ZOFRAN) 2 mg/mL injection, 4 mg, Intravenous, Q6H PRN  PARoxetine (PAXIL) tablet, 20 mg, Oral, QAM  QUEtiapine (SEROQUEL) tablet, 200 mg, Oral, NIGHTLY  QUEtiapine (SEROQUEL) tablet, 100 mg, Oral, Daily after Breakfast  saccharomyces boulardii (FLORASTOR) capsule, 250 mg, Oral, Daily  traZODone (DESYREL) tablet, 200 mg, Oral, NIGHTLY        I/O last 24 hours:     Intake/Output Summary (Last 24 hours) at 08/01/2021 0923  Last data filed at 08/01/2021 0800  Gross per 24 hour   Intake 1040 ml   Output --   Net 1040 ml         Filed Vitals:    07/31/21 2234 08/01/21 0055 08/01/21 0333 08/01/21 0334   BP:   112/76    Pulse:   81    Resp: 18 18  18    Temp:   36.8 C (98.2 F)    SpO2:   99%  I have personally reviewed the vitals.      Physical Exam:    General:, NAD, vitals signs reviewed   HEENT: NCAT, pupils equal, sclerae anicteric, mucus membranes moist    Pulm: CTA bilaterally, no wheeze or rhonchi evident  Cardiac: RRR S1 S2, no murmur  GI: soft, BS x4 normal, non-tender, non-distended   MSK:  Right ankle with edema, there is old surgical scar, no erythema noted.    Skin: no rash evident, warm and dry  Neurologic: grossly intact, no focal findings   Psych: normal mood, normal affect   Vascular: equal pulses, grossly normal    Labs: I personally reviewed all labs.   Results for orders placed or performed during the hospital encounter of 07/27/21 (from the past 24 hour(s))   CBC   Result Value Ref Range    WBC 12.7 (H) 4.5 - 11.0 x10^3/uL    RBC 4.72 4.70 - 5.40 x10^6/uL    HGB 12.0 (L) 13.5 - 18.0 g/dL    HCT 16.137.0 (L)  09.642.0 - 52.0 %    MCV 78.3 78.0 - 100.0 fL    MCH 25.3 (L) 28.0 - 33.0 pg    MCHC 32.4 32.0 - 37.0 g/dL    RDW 04.515.8 9.9 - 40.916.5 %    PLATELETS 469 (H) 130 - 400 x10^3/uL    MPV 7.0 fL   BASIC METABOLIC PANEL   Result Value Ref Range    SODIUM 139 136 - 145 mmol/L    POTASSIUM 3.9 3.5 - 5.1 mmol/L    CHLORIDE 103 96 - 111 mmol/L    CO2 TOTAL 26 22 - 30 mmol/L    ANION GAP 10 4 - 13 mmol/L    CALCIUM 9.6 8.5 - 10.0 mg/dL    GLUCOSE 77 65 - 811125 mg/dL    BUN 22 8 - 25 mg/dL    CREATININE 9.140.76 7.820.75 - 1.35 mg/dL    BUN/CREA RATIO 29 (H) 6 - 22    ESTIMATED GFR >90 >=60 mL/min/BSA   HEPATIC FUNCTION PANEL - AM ONCE   Result Value Ref Range    ALBUMIN 3.1 (L) 3.5 - 5.0 g/dL     ALKALINE PHOSPHATASE 79 45 - 115 U/L    ALT (SGPT) 23 10 - 55 U/L    AST (SGOT)  13 8 - 45 U/L    BILIRUBIN TOTAL 0.2 (L) 0.3 - 1.3 mg/dL    BILIRUBIN DIRECT 0.1 0.1 - 0.4 mg/dL    PROTEIN TOTAL 8.1 6.4 - 8.3 g/dL   C-REACTIVE PROTEIN(CRP),INFLAMMATION   Result Value Ref Range    CRP INFLAMMATION 8.0 (H) <8.0 mg/L       Imaging: I personally reviewed all imaging.   XR ANKLE RIGHT   Final Result   Progressive erosive changes at the tibiotalar joint with collapse of the dome of the talus. The findings are suggestive of worsening bony changes from septic arthritis.                   Radiologist location ID: [id number]         [facility] INFECTION IMAGING    (Results Pending)       I independently of the faculty provider spent a total of (15) minutes in direct/indirect care of this patient including initial evaluation, review of laboratory, radiology, diagnostic studies, review of medical record, order entry and coordination of care.      Herschell DimesSarah Sampson, NP   I personally saw and evaluated the patient  as part of a shared service with an APP.    My substantive findings are:  MDM (complete) as above; patient states his foot is back to baseline; waiting nuc tagged wbc scan; id has been consulted; will need total of 14 days iv abx then po abx for 2-4 weeks;  has  PICC line; today is day 4 of 14 days of iv abx;   I independently of the APP spent a total of (16) minutes in direct/indirect care of this patient including initial evaluation, review of laboratory, radiology, diagnostic studies, review of medical record, order entry and coordination of care.     Lamont Snowball, DO

## 2021-08-02 ENCOUNTER — Inpatient Hospital Stay (HOSPITAL_COMMUNITY): Payer: Medicare Other

## 2021-08-02 LAB — BASIC METABOLIC PANEL
ANION GAP: 11 mmol/L (ref 4–13)
BUN/CREA RATIO: 32 — ABNORMAL HIGH (ref 6–22)
BUN: 25 mg/dL (ref 8–25)
CALCIUM: 9.5 mg/dL (ref 8.5–10.0)
CHLORIDE: 104 mmol/L (ref 96–111)
CO2 TOTAL: 25 mmol/L (ref 22–30)
CREATININE: 0.78 mg/dL (ref 0.75–1.35)
ESTIMATED GFR: >90 mL/min/BSA (ref >=60)
GLUCOSE: 99 mg/dL (ref 65–125)
POTASSIUM: 3.8 mmol/L (ref 3.5–5.1)
SODIUM: 140 mmol/L (ref 136–145)

## 2021-08-02 LAB — CBC
HCT: 37.8 % — ABNORMAL LOW (ref 42.0–52.0)
HGB: 12.3 g/dL — ABNORMAL LOW (ref 13.5–18.0)
MCH: 25.5 pg — ABNORMAL LOW (ref 28.0–33.0)
MCHC: 32.5 g/dL (ref 32.0–37.0)
MCV: 78.3 fL (ref 78.0–100.0)
MPV: 7.1 fL
PLATELETS: 464 10*3/uL — ABNORMAL HIGH (ref 130–400)
RBC: 4.83 10*6/uL (ref 4.70–5.40)
RDW: 16.1 % (ref 9.9–16.5)
WBC: 10.3 10*3/uL (ref 4.5–11.0)

## 2021-08-02 LAB — HEPATIC FUNCTION PANEL
ALBUMIN: 3.2 g/dL — ABNORMAL LOW (ref 3.5–5.0)
ALKALINE PHOSPHATASE: 78 U/L (ref 45–115)
ALT (SGPT): 22 U/L (ref 10–55)
AST (SGOT): 12 U/L (ref 8–45)
BILIRUBIN DIRECT: 0.1 mg/dL (ref 0.1–0.4)
BILIRUBIN TOTAL: 0.3 mg/dL (ref 0.3–1.3)
PROTEIN TOTAL: 8.2 g/dL (ref 6.4–8.3)

## 2021-08-02 LAB — STERILE SITE CULTURE AND GRAM STAIN, AEROBIC: GRAM STAIN: NONE SEEN — AB

## 2021-08-02 LAB — ANAEROBIC CULTURE

## 2021-08-02 LAB — C-REACTIVE PROTEIN(CRP),INFLAMMATION: CRP INFLAMMATION: 7.1 mg/L (ref <8.0)

## 2021-08-02 MED ORDER — TRIAMCINOLONE ACETONIDE 0.1 % TOPICAL CREAM
TOPICAL_CREAM | Freq: Three times a day (TID) | CUTANEOUS | Status: DC | PRN
Start: 2021-08-02 — End: 2021-08-03
  Filled 2021-08-02: qty 15

## 2021-08-02 NOTE — Care Plan (Signed)
Patient A&Ox4 and is currently on IV antibiotics. Nuclear med study was ordered for RLE. Awaiting results of this study. Patient was ambulating in hall, independently with use of cane. RLE has 1+ pulses and 2+ edema noted. Patient to go to SNF for IV antibiotics on discharge. Care plan reviewed. Call light and personal belongings in reach. Marva Panda, RN  08/02/2021, 17:50

## 2021-08-02 NOTE — Care Plan (Signed)
POC continued. Patient is alert and oriented, up independently in room, lungs clear on room air. PICC to LUA with purple port occluded, red port flushes and gives slow blood return. Purple port marked with tape to not be used. Patient still complaining of pain, 7-10/10, medicated per MAR. Call bell in reach, patient is able to make needs known. Patient safety maintained.Pleas Koch, RN  08/02/2021, 03:01

## 2021-08-02 NOTE — Care Plan (Signed)
08/02/21 1420   Rehab Session   Document Type therapy progress note (daily note)   Total PT Minutes: 15   Patient Effort good   General Information   Patient Profile Reviewed yes   Respiratory Status room air   Pre Treatment Status   Pre Treatment Patient Status Patient supine in bed   Cognitive Assessment/Interventions   Behavior/Mood Observations behavior appropriate to situation, WNL/WFL   Pain Assessment   Pre/Posttreatment Pain Comment c/o right ankle pain with gait   Bed Mobility Assessment/Treatment   Supine-Sit Independence independent   Sit to Supine, Independence independent   Transfer Assessment/Treatment   Sit-Stand Independence independent   Stand-Sit Independence independent   Gait Assessment/Treatment   Independence  stand-by assistance   Assistive Device  straight cane   Distance in Feet 150 feet   Balance Skill Training   Sitting Balance: Static good balance   Sitting, Dynamic (Balance) good balance   Sit-to-Stand Balance good balance   Standing Balance: Static good balance   Standing Balance: Dynamic good balance   Post Treatment Status   Post Treatment Patient Status Patient supine in bed;Call light within reach;Telephone within reach   Plan of Care Review   Plan Of Care Reviewed With patient

## 2021-08-02 NOTE — Progress Notes (Signed)
Hospitalist Progress Note    Bryan [name]       46 y.o.       Date of service: 08/02/2021  Date of Admission:  07/27/2021    Hospital Day:  LOS: 6 days       Assessment/Plan:   Active Hospital Problems    Diagnosis   . Primary Problem: Osteomyelitis (CMS HCC)   . Tobacco use disorder     1. 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  White blood cell count of 10.3.  Afebrile.   Awaiting NUC infection imaging.  Podiatry is consulted and appreciate input. Hx of fracture and repair. ID consulted recommended Daptomycin and Cefepime for total of 14 days (4/14) than transition to oral Linezolid and Levaquin for 2-4 weeks.  PT consulted.      2. Tobacco abuse disorder:  Encouraged cessation, nicotine replacement therapy offered.    3. Mood Disorder: continue home medications.    4. Hepatitis C: outpatient treatment.         DVT/PE Prophylaxis: Enoxaparin      Disposition Planning: Home discharge   VS SNF     He would be able to go to SNF tomorrow afternoon for IV antibiotics.       CC: Osteomyelitis (CMS HCC)      Subjective:  This is a 46 year old male who initially presented with pain.  His ankle is feeling improved.   Patient denies chest pain, SOB, abdominal pain, fever, chills, nausea or vomiting.  Continues to have right ankle pain.    Medications reviewed.  acetaminophen (TYLENOL) tablet, 650 mg, Oral, Q4H PRN  alteplase (CATHFLO ACTIVASE) 2 mg/2 mL injection, 2 mg, Intra-Catheter, Daily PRN  cefepime (MAXIPIME) 2 g in NS 100 mL IVPB minibag, 2 g, Intravenous, Q12H  DAPTOmycin (CUBICIN) 450 mg in NS 50 mL IVPB, 6 mg/kg (Adjusted), Intravenous, Q24H  enoxaparin PF (LOVENOX) 40 mg/0.4 mL SubQ injection, 40 mg, Subcutaneous, Q24H  HYDROcodone-acetaminophen (NORCO) 5-325 mg per tablet, 1 Tablet, Oral, Q4H PRN  ibuprofen (MOTRIN) tablet, 600 mg, Oral, Q6H PRN  influenza virus vaccine (PF) IM injection (FLUARIX for ages 6 months through adult),  0.5 mL, IntraMUSCULAR, Once  loperamide (IMODIUM) capsule, 2 mg, Oral, Q4H PRN  magnesium hydroxide (MILK OF MAGNESIA) 400mg  per 33mL oral liquid, 15 mL, Oral, Daily PRN  morphine 4 mg/mL injection, 4 mg, Intravenous, Q4H PRN  nicotine (NICODERM CQ) transdermal patch (mg/24 hr), 14 mg, Transdermal, Daily  NS flush syringe, 10 mL, Intravenous, Q8HRS  NS flush syringe, 20 mL, Intravenous, Q1 MIN PRN  ondansetron (ZOFRAN) 2 mg/mL injection, 4 mg, Intravenous, Q6H PRN  PARoxetine (PAXIL) tablet, 20 mg, Oral, QAM  QUEtiapine (SEROQUEL) tablet, 200 mg, Oral, NIGHTLY  QUEtiapine (SEROQUEL) tablet, 100 mg, Oral, Daily after Breakfast  saccharomyces boulardii (FLORASTOR) capsule, 250 mg, Oral, Daily  traZODone (DESYREL) tablet, 200 mg, Oral, NIGHTLY        I/O last 24 hours:     Intake/Output Summary (Last 24 hours) at 08/02/2021 0903  Last data filed at 08/01/2021 1723  Gross per 24 hour   Intake 159 ml   Output --   Net 159 ml         Filed Vitals:    08/01/21 1058 08/01/21 1432 08/01/21 2054 08/02/21 0441   BP:   106/77 120/73   Pulse:   71 76   Resp: 17 18 18 16    Temp:   37 C (98.6 F)  36.3 C (97.3 F)   SpO2:   99% 98%     I have personally reviewed the vitals.      Physical Exam:    General:, NAD, vitals signs reviewed   HEENT: NCAT, pupils equal, sclerae anicteric, mucus membranes moist    Pulm: CTA bilaterally, no wheeze or rhonchi evident  Cardiac: RRR S1 S2, no murmur  GI: soft, BS x4 normal, non-tender, non-distended   MSK:  Right ankle with edema, there is old surgical scar, no erythema noted.    Skin: no rash evident, warm and dry  Neurologic: grossly intact, no focal findings   Psych: normal mood, normal affect   Vascular: equal pulses, grossly normal    Labs: I personally reviewed all labs.   Results for orders placed or performed during the hospital encounter of 07/27/21 (from the past 24 hour(s))   CBC   Result Value Ref Range    WBC 10.3 4.5 - 11.0 x10^3/uL    RBC 4.83 4.70 - 5.40 x10^6/uL    HGB 12.3 (L)  13.5 - 18.0 g/dL    HCT 37.8 (L) 42.0 - 52.0 %    MCV 78.3 78.0 - 100.0 fL    MCH 25.5 (L) 28.0 - 33.0 pg    MCHC 32.5 32.0 - 37.0 g/dL    RDW 16.1 9.9 - 16.5 %    PLATELETS 464 (H) 130 - 400 x10^3/uL    MPV 7.1 fL   BASIC METABOLIC PANEL   Result Value Ref Range    SODIUM 140 136 - 145 mmol/L    POTASSIUM 3.8 3.5 - 5.1 mmol/L    CHLORIDE 104 96 - 111 mmol/L    CO2 TOTAL 25 22 - 30 mmol/L    ANION GAP 11 4 - 13 mmol/L    CALCIUM 9.5 8.5 - 10.0 mg/dL    GLUCOSE 99 65 - 125 mg/dL    BUN 25 8 - 25 mg/dL    CREATININE 0.78 0.75 - 1.35 mg/dL    BUN/CREA RATIO 32 (H) 6 - 22    ESTIMATED GFR >90 >=60 mL/min/BSA   HEPATIC FUNCTION PANEL - AM ONCE   Result Value Ref Range    ALBUMIN 3.2 (L) 3.5 - 5.0 g/dL     ALKALINE PHOSPHATASE 78 45 - 115 U/L    ALT (SGPT) 22 10 - 55 U/L    AST (SGOT)  12 8 - 45 U/L    BILIRUBIN TOTAL 0.3 0.3 - 1.3 mg/dL    BILIRUBIN DIRECT 0.1 0.1 - 0.4 mg/dL    PROTEIN TOTAL 8.2 6.4 - 8.3 g/dL   C-REACTIVE PROTEIN(CRP),INFLAMMATION   Result Value Ref Range    CRP INFLAMMATION 7.1 <8.0 mg/L       Imaging: I personally reviewed all imaging.   XR ANKLE RIGHT   Final Result   Progressive erosive changes at the tibiotalar joint with collapse of the dome of the talus. The findings are suggestive of worsening bony changes from septic arthritis.                   Radiologist location ID: [id number]    (Results Pending)       I independently of the faculty provider spent a total of (15) minutes in direct/indirect care of this patient including initial evaluation, review of laboratory, radiology, diagnostic studies, review of medical record, order entry and coordination of care.  Leone Payor, NP     I personally saw and evaluated the patient as part of a shared service with an APP.    My substantive findings are:  MDM (complete)  patient seen examined.  White blood cell count 10.3.  Remains afebrile.  No growth from a sterile culture site.  WBC tagged scan should be completed  tomorrow.  Will need to complete course of IV antibiotics for total 14 days.  Stop date is April 14th.  He will then need transition to oral antibiotics for 2-4 weeks.     I independently of the APP spent a total of (15) minutes in direct/indirect care of this patient including initial evaluation, review of laboratory, radiology, diagnostic studies, review of medical record, order entry and coordination of care.     Bevely Palmer, DO

## 2021-08-03 ENCOUNTER — Inpatient Hospital Stay (HOSPITAL_COMMUNITY): Payer: Medicare Other

## 2021-08-03 ENCOUNTER — Other Ambulatory Visit: Payer: Self-pay

## 2021-08-03 DIAGNOSIS — F419 Anxiety disorder, unspecified: Secondary | ICD-10-CM

## 2021-08-03 DIAGNOSIS — F411 Generalized anxiety disorder: Secondary | ICD-10-CM

## 2021-08-03 DIAGNOSIS — F603 Borderline personality disorder: Secondary | ICD-10-CM

## 2021-08-03 DIAGNOSIS — D649 Anemia, unspecified: Secondary | ICD-10-CM

## 2021-08-03 DIAGNOSIS — F1520 Other stimulant dependence, uncomplicated: Secondary | ICD-10-CM

## 2021-08-03 DIAGNOSIS — F4310 Post-traumatic stress disorder, unspecified: Secondary | ICD-10-CM

## 2021-08-03 MED ORDER — HYDROCODONE 5 MG-ACETAMINOPHEN 325 MG TABLET
1.0000 | ORAL_TABLET | ORAL | 0 refills | Status: DC | PRN
Start: 2021-08-03 — End: 2022-01-03

## 2021-08-03 MED ORDER — QUETIAPINE 25 MG TABLET
25.0000 mg | ORAL_TABLET | Freq: Once | ORAL | Status: DC | PRN
Start: 2021-08-03 — End: 2021-08-03

## 2021-08-03 MED ORDER — NICOTINE (POLACRILEX) 2 MG GUM
2.0000 mg | CHEWING_GUM | BUCCAL | Status: DC | PRN
Start: 2021-08-03 — End: 2021-08-03

## 2021-08-03 NOTE — Progress Notes (Signed)
Hospitalist Progress Note    Bryan [name]       46 y.o.       Date of service: 08/03/2021  Date of Admission:  07/27/2021    Hospital Day:  LOS: 7 days       Assessment/Plan:   Active Hospital Problems    Diagnosis   . Primary Problem: Osteomyelitis (CMS HCC)   . Tobacco use disorder     1. Septic right ankle arthritis/osteomyelitis/charcot arthropathy considered: Sterile site culture with no growth to date.   Afebrile.   Awaiting NUC infection imaging.  Podiatry is consulted and appreciate input. Hx of fracture and repair. ID consulted recommended Daptomycin and Cefepime for total of 14 days (4/14) than transition to oral Linezolid and Levaquin for 2-4 weeks.  PT consulted. PICC Line in place.  SNF has been set up but patient is currently declining to go now due to anxiety.  DC IV morphine.       2. Tobacco abuse disorder:  Encouraged cessation, nicotine replacement therapy offered.    3. Mood Disorder: continue home medications. I will consult with psych due to his anxiety.      4. Hepatitis C: outpatient treatment.         DVT/PE Prophylaxis: Enoxaparin      Disposition Planning: Home discharge   VS SNF     Medically stable for SNF.  Currently declining to go due to anxiety.  Will consult with psychiatrist.      CC: Osteomyelitis (CMS HCC)      Subjective:  This is a 46 year old male who initially presented with pain.  His ankle is feeling improved rates pain 6.   Patient denies chest pain, SOB, abdominal pain, fever, chills, nausea or vomiting.  Continues to have right ankle pain.  He feels comfortable here he states he has anxiety and autism that is why he is unwilling to go to the SNF.    Medications reviewed.  acetaminophen (TYLENOL) tablet, 650 mg, Oral, Q4H PRN  alteplase (CATHFLO ACTIVASE) 2 mg/2 mL injection, 2 mg, Intra-Catheter, Daily PRN  cefepime (MAXIPIME) 2 g in NS 100 mL IVPB minibag, 2 g, Intravenous, Q12H  DAPTOmycin (CUBICIN) 450 mg in NS 50 mL IVPB, 6 mg/kg (Adjusted), Intravenous,  Q24H  enoxaparin PF (LOVENOX) 40 mg/0.4 mL SubQ injection, 40 mg, Subcutaneous, Q24H  HYDROcodone-acetaminophen (NORCO) 5-325 mg per tablet, 1 Tablet, Oral, Q4H PRN  ibuprofen (MOTRIN) tablet, 600 mg, Oral, Q6H PRN  influenza virus vaccine (PF) IM injection (FLUARIX for ages 6 months through adult), 0.5 mL, IntraMUSCULAR, Once  loperamide (IMODIUM) capsule, 2 mg, Oral, Q4H PRN  magnesium hydroxide (MILK OF MAGNESIA) 400mg  per 60mL oral liquid, 15 mL, Oral, Daily PRN  nicotine (NICODERM CQ) transdermal patch (mg/24 hr), 14 mg, Transdermal, Daily  NS flush syringe, 10 mL, Intravenous, Q8HRS  NS flush syringe, 20 mL, Intravenous, Q1 MIN PRN  ondansetron (ZOFRAN) 2 mg/mL injection, 4 mg, Intravenous, Q6H PRN  PARoxetine (PAXIL) tablet, 20 mg, Oral, QAM  QUEtiapine (SEROQUEL) tablet, 200 mg, Oral, NIGHTLY  QUEtiapine (SEROQUEL) tablet, 100 mg, Oral, Daily after Breakfast  saccharomyces boulardii (FLORASTOR) capsule, 250 mg, Oral, Daily  traZODone (DESYREL) tablet, 200 mg, Oral, NIGHTLY  triamcinolone acetonide (ARISTOCORT A) 0.1% topical cream, , Apply Topically, 3x/day PRN        I/O last 24 hours:     Intake/Output Summary (Last 24 hours) at 08/03/2021 0841  Last data filed at 08/02/2021 1742  Gross per 24 hour  Intake 399 ml   Output --   Net 399 ml         Filed Vitals:    08/03/21 0030 08/03/21 0037 08/03/21 0304 08/03/21 0603   BP:    102/64   Pulse:    51   Resp: 15  14 18    Temp:  36.3 C (97.4 F)  37 C (98.6 F)   SpO2:    99%     I have personally reviewed the vitals.      Physical Exam:    General:, NAD, vitals signs reviewed   HEENT: NCAT, pupils equal, sclerae anicteric, mucus membranes moist    Pulm: CTA bilaterally, no wheeze or rhonchi evident  Cardiac: RRR S1 S2, no murmur  GI: soft, BS x4 normal, non-tender, non-distended   MSK:  Right ankle with edema, there is old surgical scar, no erythema noted.    Skin: no rash evident, warm and dry  Neurologic: grossly intact, no focal findings   Psych: anxious.   Normal affect    Vascular: equal pulses, grossly normal    Labs: I personally reviewed all labs.   No results found for any visits on 07/27/21 (from the past 24 hour(s)).    Imaging: I personally reviewed all imaging.   XR ANKLE RIGHT   Final Result   Progressive erosive changes at the tibiotalar joint with collapse of the dome of the talus. The findings are suggestive of worsening bony changes from septic arthritis.                   Radiologist location ID: [id number]         [facility]    (Results Pending)       I independently of the faculty provider spent a total of (13) minutes in direct/indirect care of this patient including initial evaluation, review of laboratory, radiology, diagnostic studies, review of medical record, order entry and coordination of care.      [id number], NP   I personally saw and evaluated the patient as part of a shared service with an APP.    My substantive findings are:  MDM (complete) spoke with patient in presence of Bragg, RN; he is agreeable to skilled; can be d/c once imaging complete; will give dose atarax for anxiety.   I independently of the APP spent a total of (25) minutes in direct/indirect care of this patient including initial evaluation, review of laboratory, radiology, diagnostic studies, review of medical record, order entry and coordination of care.     Herschell Dimes, DO

## 2021-08-03 NOTE — Consults (Signed)
PSYCHIATRIC EVALUATION         Start time:  09:11             Patient Name: Prudencio Velazco   Patient DOB:  1976/03/12   Aram Beecham provider: Georgeanne Nim, MD      Telemedicine Documentation: This is a telemedicine note. Patient was treated using telemedicine, real time audio and video  Patient Location: [facility]  Provider Location: [place]    Patient/family aware of provider location: YES  Patient/family consent for telemedicine: YES    Examination observed and performed by: Georgeanne Nim, MD   -------------------------------------------------------------------------------------------------------  Informants /Collateral information reviewed: Patient, Hospital Staff and EMR    Per primary team/nursing/chart review:   - per initial H&P (admit date 07/27/2021): Valor Quaintance is a 46 y.o., White male who presents as a transfer from [facility] for right ankle pain and swelling for "a long time" now. Patient states that he has had trouble with his ankle in the past but has left the ER because it was taking too long. Patient does state that has been on antibiotics before but is unable to tell me which ones. At [facility] the patient was found to be anemic with a HGB 10.3, HCT 31.3, RBC 3.96, MCV 78.4, which does appear to be his baseline. His Sed rate was elevated at 74, CRP of 71.7, lactic of 2.8 which did trend down. Patient was transferred to [facility] for ortho consult after MRI of right ankle showed osteomyelitis. Patient denies any fevers at home. No chest pain or shortness of breath. Patient denies illegal drug use, however, urine drug screen at [facility] is positive for amphetamines, methamphetamines, and cannabinoids.   - medically admitted and managed for osteomyelitis. During the course of the treatment patient has been continued on Seroquel and trazodone. Has complained of increased anxiety. Pt expected to go to a SNF as he needs IV Abx; patient reported that he has  autism and doesn't do well with transitions or change and requesting to continue the care that would be provided at the SNF would be done here in the hospital.  - Calm and cooperative with care.     CHIEF COMPLAINT: "I feel better today I think"    HISTORY OF PRESENT ILLNESS: Mays Paino is a 46 y.o. male admitted for management of osteomyelitis. Has done well the treatment and expected to go to an SNF to continue on IV Abx. Patient with psychiatric diagnosis includes: anxiety, depression, bipolar, ptsd, substance abuse, and borderline PD. Tregan reports that since being told that he would be transferred to an SNF for ongoing medication management he has been experiencing increased anxiety, "it was worse when the first told me. I think I 'm better now. My morning meds help too." Patient's concern include previous sepsis that required 4 months in the hospital, and now he feels rushed and confused about leaving the hospital only to spend a weeks time at an SNF. Time spent processing feelings and answering questions around this treatment plan. Patient denies SI/HI or hallucinations. Patient has a long history of ental health issues that has been complicated with substance abuse. Reviewed current psychotropic medications; patient thinks his meds are work well for him and doesn't think he needs any adjustments to the regimen. Reports good sleep and appetite. Reviewed coping skills.    CURRENT PSYCHIATRIC REVIEW OF SYMPTOMS:  Mania: denied  Depression: denied  Anxiety: over thinking/catastrophizing  Psychosis: denied  Borderline PD: A  pattern of intense and unstable relationships with family, friends, and loved ones, A distorted and unstable self-image or sense of sel and Recurring thoughts of suicidal behaviors or threats      PAST PSYCHIATRIC HISTORY  Diagnosis: Bipolar, Anxiety, Depression, BPD, PTSD, substance abuse  Treatment History:  Currently med management only, reports therapy starting as early as the 2 nd  grade. Hx of substance rehab in addition to psych admissions.  Medication Trials: multiple med trials in the past; Currently taking Paxil, Seroquel, and trazodone  Hospitalizations:  YES  Suicide Attempts: YES  History of self injury?  YES (remote hx of doing it more regularly, about 3 months ago he cut in the a long time      FAMILY PSYCHIATRIC HISTORY:       Is there a family hx of suicide? YES, brother  Mother: bipolar  Father (deceased): bipolar  Sibling(s):  Brother died by suicide  Child(ren): 3 kids and a granddaughter. Daughter and son have anxiety and depressio      SUBSTANCE HISTORY:   Marcel  reports that he has been smoking cigarettes. He has a 30.00 pack-year smoking history. He has never used smokeless tobacco. He reports current drug use. Frequency: 7.00 times per week. Drugs: Marijuana and Opioid. He reports that he does not drink alcohol.      SOCIAL/DEVELOPMENTAL HISTORY  Housing/Living situation: lives with mother  Social supports:  family  Education: defer  Employment: no; currently gets benefits  Abuse/Trauma History: yes  Legal History: defer  Access to guns:  NO      MEDICAL HISTORY   Past Medical History:   Diagnosis Date   . Anxiety    . Back pain    . COPD (chronic obstructive pulmonary disease) (CMS HCC)    . Depression    . Osteochondrosis     right foot      Past Surgical History:   Procedure Laterality Date   . ANKLE SURGERY     . ANKLE SURGERY Right     cleaned out arthritis   . HX HERNIA REPAIR     . HX LUMBAR SPINE SURGERY     . HX ROTATOR CUFF REPAIR Right        ALLERGIES   Allergies   Allergen Reactions   . Latex Rash   . Adhesive Rash   . Tramadol Rash   . Meloxicam    . Vistaril [Hydroxyzine Hcl] Swelling        MEDICATIONS  (Not in an outpatient encounter)      SCHEDULED MEDICATIONS  .  cefepime (MAXIPIME) 2 g in NS 100 mL IVPB minibag 2 g Q12H  .  DAPTOmycin (CUBICIN) 450 mg in NS 50 mL IVPB 6 mg/kg (Adjusted) Q24H  .  enoxaparin PF (LOVENOX) 40 mg/0.4 mL SubQ injection 40 mg  Q24H  .  influenza virus vaccine (PF) IM injection (FLUARIX for ages 6 months through adult) 0.5 mL Once  .  nicotine (NICODERM CQ) transdermal patch (mg/24 hr) 14 mg Daily  .  NS flush syringe 10 mL Q8HRS  .  PARoxetine (PAXIL) tablet 20 mg QAM  .  QUEtiapine (SEROQUEL) tablet 200 mg NIGHTLY  .  QUEtiapine (SEROQUEL) tablet 100 mg Daily after Breakfast  .  saccharomyces boulardii (FLORASTOR) capsule 250 mg Daily  .  traZODone (DESYREL) tablet 200 mg NIGHTLY      MEDICAL REVIEW OF SYSTEMS  11 ROS reviewed, negative or normal or has otherwise specified in subjective or  HPI      VITALS  Blood pressure 102/64, pulse 51, temperature 37 C (98.6 F), resp. rate 18, height 1.829 m (6'), weight 77.5 kg (170 lb 14.4 oz), SpO2 99 %.      MENTAL STATUS EXAMINATION  Appearance:  appears stated age in hospital gown appropriately groomed appearing normal weight  Level of Consciousness:  Alert   Behavior: Pleasant and Cooperative  Gait: Not tested  Psychomotor: No agitation or retardation  Speech:  normal rate, tone, volume, and amount  Language:  fluency, naming, repetition, comprehension and pragmatics are within normal limits  Thought Process: linear, goal directed  Thought Content: no suicidal or homicidal ideation   Affect: anxious  Mood:  anxious  Orientation:  O x 3  Attention:  within normal limits  Concentration:  within normal limits  Memory:  unremarkable per conversation  Fund of Knowledge/Intellect:  adequate  Abstraction: intact  Insight: intact  Judgment:  intact  Impulse Control:  intact    -------------------------------------------------------------------------------------  LABS  No results found for this or any previous visit (from the past 24 hour(s)).  -------------------------------------------------------------------------------------    PATIENT CENTERED CARE  PATIENT STRENGTHS:   access to mental health services and social support  PATIENT PREFERENCES:     amenable to psychiatrist's  recommendations  PATIENT GOALS:  relief from psychiatric symptoms    Is Pain Management Referral Indicated? no  Is Nutrition Consultation Indicated?  no    ------------------------------------------------------------------------------------  COLUMBIA-SUICIDE SEVERITY RATING SCALE (C-SSRS)                                 RISK ASSESSMENT    Suicidal and Self-Injurious Behavior in last 3 months  NONE    Suicidal and Self-Injurious Behavior in LIFETIME   Actual suicide attempt, Aborted or Self-Interrupted attempt and Self-injurious behavior without suicide attempt    Suicidal Ideation (Check Most Severe in Past Month)  reports chronic, intermittent passive/nihilistic suicidal thoughts    Activating Events (Recent)  NO Activating Factors on C-SSRS Risk Assessment Menu    Treatment History  Previous psychiatric diagnoses and treatments    Other Risk Factors  Substance use disorder    Clinical Status (Recent)  Substance abuse or dependence    Protective Factors (Recent)  Identifies reasons for living  Responsibility to family or others; living with family  Supportive social network or family    Other Protective Factors:    Believably reports no overpowering urge to hurt self or others    Describe any suicidal, self-injurious or aggressive behavior (include dates): n/a    SUICIDAL RISK ASSESSMENT: LOW; though risk is assessed in consideration of patient's recent mental health crisis, other considerations include (but not limited to) patient's psychiatric history and current psychiatric stability when determining treatment recommendations (please see below).  -------------------------------------------------------------------------------------  ASSESSMENT/IMPRESSION: Maricela CuretJoshua Adam Segundo is a 46 y.o. male admitted for management of osteomyelitis. Has done well the treatment and expected to go to an SNF to continue on IV Abx.Ivin Booty. Chukwudi reports that since being told that he would be transferred to an SNF for ongoing medication  management he has been experiencing increased anxiety, "it was worse when the first told me. I think I 'm better now. My morning meds help too."  Patient denies SI/HI or hallucinations. Patient thinks his meds are work well for him and doesn't think he needs any adjustments to the regimen. Though he shared his desire to continue  and complete the weeks course of IV Abx in the hospital, patient appeared to understand the current treatment plan.      DIAGNOSIS:  Generalized Anxiety Disorder  Mood Disorder  Stimulant Dependence  PTSD by hx  Borderline PD      RECOMMENDATIONS:   DISPOSITION: TBD by primary team, patient does not meet criteria for inpatient psych    MEDICATIONS:   - continue Seroquel 100 mg PO Daily and 200 mg PO QHS  - please add Seroquel 25 mg PO BID PRN   - continue trazodone and Paxil as currently prescribed    ADDITIONAL RECOMMENDATIONS:   - none        -------------------------------------------------------------------------------------        Total Time Spent on this consult, including at least 50% of that time in discussion and counseling the patient regarding diagnosis, prognosis, and treatment plan (including risks and benefits), along with coordination of care: 30 minutes    Georgeanne Nim, MD  _______________________________

## 2021-08-03 NOTE — Nurses Notes (Signed)
Report called to [facility]. No questions or concerns at this time.

## 2021-08-04 ENCOUNTER — Other Ambulatory Visit (HOSPITAL_COMMUNITY): Payer: Self-pay

## 2021-08-22 ENCOUNTER — Ambulatory Visit (HOSPITAL_BASED_OUTPATIENT_CLINIC_OR_DEPARTMENT_OTHER): Payer: Self-pay | Admitting: Family Medicine

## 2021-08-24 LAB — FUNGUS CULTURE: FUNGAL CULTURE: NO GROWTH

## 2021-12-28 ENCOUNTER — Emergency Department
Admission: EM | Admit: 2021-12-28 | Discharge: 2021-12-28 | Discharge status: Against Medical Advice | Payer: Medicare Other | Attending: Student in an Organized Health Care Education/Training Program | Admitting: Student in an Organized Health Care Education/Training Program

## 2021-12-28 ENCOUNTER — Encounter (HOSPITAL_COMMUNITY): Payer: Self-pay

## 2021-12-28 ENCOUNTER — Inpatient Hospital Stay (HOSPITAL_COMMUNITY): Admission: RE | Admit: 2021-12-28 | Payer: Medicare Other | Source: Ambulatory Visit

## 2021-12-28 ENCOUNTER — Other Ambulatory Visit: Payer: Self-pay

## 2021-12-28 DIAGNOSIS — M7989 Other specified soft tissue disorders: Secondary | ICD-10-CM

## 2021-12-28 DIAGNOSIS — F1991 Other psychoactive substance use, unspecified, in remission: Secondary | ICD-10-CM | POA: Insufficient documentation

## 2021-12-28 DIAGNOSIS — L03116 Cellulitis of left lower limb: Secondary | ICD-10-CM | POA: Insufficient documentation

## 2021-12-28 DIAGNOSIS — F17210 Nicotine dependence, cigarettes, uncomplicated: Secondary | ICD-10-CM | POA: Insufficient documentation

## 2021-12-28 DIAGNOSIS — Z5329 Procedure and treatment not carried out because of patient's decision for other reasons: Secondary | ICD-10-CM | POA: Insufficient documentation

## 2021-12-28 MED ORDER — SULFAMETHOXAZOLE 800 MG-TRIMETHOPRIM 160 MG TABLET
1.0000 | ORAL_TABLET | Freq: Two times a day (BID) | ORAL | 0 refills | Status: DC
Start: 2021-12-28 — End: 2022-01-03

## 2021-12-28 MED ORDER — SODIUM CHLORIDE 0.9 % IV BOLUS
1000.0000 mL | INJECTION | Status: DC
Start: 2021-12-28 — End: 2021-12-28
  Administered 2021-12-28: 0 mL via INTRAVENOUS

## 2021-12-28 MED ORDER — MUPIROCIN 2 % TOPICAL OINTMENT
TOPICAL_OINTMENT | CUTANEOUS | Status: AC
Start: 2021-12-28 — End: 2021-12-28
  Filled 2021-12-28: qty 22

## 2021-12-28 MED ORDER — CEPHALEXIN 500 MG CAPSULE
500.0000 mg | ORAL_CAPSULE | Freq: Four times a day (QID) | ORAL | 0 refills | Status: DC
Start: 2021-12-28 — End: 2022-01-03

## 2021-12-28 MED ORDER — MUPIROCIN 2 % TOPICAL OINTMENT
TOPICAL_OINTMENT | Freq: Three times a day (TID) | CUTANEOUS | 0 refills | Status: AC
Start: 2021-12-28 — End: 2022-01-07

## 2021-12-28 NOTE — ED Nurses Note (Signed)
Physician is aware of pt abnormal vitals

## 2021-12-28 NOTE — ED Provider Notes (Signed)
Bryan [name]   1976-01-15   46 y.o.   male     Chief Complaint:   Chief Complaint   Patient presents with    Insect Bite        HPI: This is a 46 y.o. male who presents to the emergency department via private vehicle in the care of his mother with chief complain of bites to his left lower leg.  Patient states for the past 2 weeks he noticed that he had bites on his left lower leg around his inner knee that had progressively gotten worse.  He states the swelling and redness extended down the leg into his foot and up into his thigh.  He states the blisters opened up yesterday and he did express purulent material.  Then has had fevers and chills and nausea.  He states he does have a history of osteomyelitis in the other leg which she did not complete medical care for a left AMA.  Patient denies any chest pain/tightness, shortness of breath, coughing wheezing, abdominal pain, issues bowel/urination.  Patient is an everyday smoker and does have history of drug use.  Patient rates the pain a 15/10 when he was walking when he was sitting still it is a 5/10.  ?   ?   ?   Past Medical History:   Past Medical History:   Diagnosis Date    Anxiety     Back pain     COPD (chronic obstructive pulmonary disease) (CMS HCC)     Depression     Osteochondrosis     right foot        Past Surgical History:   Past Surgical History:   Procedure Laterality Date    Ankle surgery      Ankle surgery Right     Hx hernia repair      Hx lumbar spine surgery      Hx rotator cuff repair Right         Social History:   Social History     Tobacco Use    Smoking status: Every Day     Packs/day: 1.00     Years: 30.00     Pack years: 30.00     Types: Cigarettes    Smokeless tobacco: Never   Vaping Use    Vaping Use: Some days    Substances: THC   Substance Use Topics    Alcohol use: Never    Drug use: Yes     Frequency: 7.0 times per week     Types: Marijuana, Opioid      Social History     Substance and Sexual Activity   Drug Use Yes    Frequency:  7.0 times per week    Types: Marijuana, Opioid         Allergies:   Allergies   Allergen Reactions    Latex Rash    Adhesive Rash    Tramadol Rash    Meloxicam     Vistaril [Hydroxyzine Hcl] Swelling      ?     Review of Systems: Other than pertinent positives discussed in HPI, all other systems reviewed and are negative.  ?     Physical Exam:     General: Patient alert and oriented x4. No acute distress. BP (!) 127/102   Pulse 99   Temp (!) 38.6 C (101.5 F)   Resp 20   Ht 1.829 m (6')   Wt 77.1 kg (170 lb)  SpO2 100%   BMI 23.06 kg/m   HEENT: Head: Normocephalic, atraumatic. Eyes: Normal conjunctiva. Oral mucosa moist.  CV: Regular rate and rhythm without murmurs, rubs, gallops  Respiratory: No respiratory distress noted.  CTAB without crackles, wheeze, rhonchi  MS:  Swelling with pitting edema noted to left foot up to thigh with full range of motion of joints.  Swelling noted to right lower extremity with  small area of erythema noted on the foot    Neuro: Alert and oriented x4. No focal motor or sensory deficits. GCS 15.  Skin: Warm and dry.  Erythema noted from left foot clear to left thigh with 3 ulcers noted to medial left leg      ?   ?   ?   Medical Decision Making: Patient was triaged, vital signs were obtained, patient was placed in a room. I did examine the patient.  Patient was febrile and had greatly elevated blood pressure upon arrival.  After examining the patient concern for cellulitis, abscess, necrotizing fasciitis, PE, sepsis, other.  Patient was informed of what the visit would entail due to how extreme the leg appeared.  Patient stated he had other business to attend to and could not be admitted this time but would rather be on antibiotics and sent home.  The risks and benefits of admission versus out patient treatment were discussed with patient.  He was informed that he may completely lose leg, become septic, or die.  He stated that he would return to the ER in a few days once he  had his affairs in order.  He was okay with having a wound culture collected of the purulent material.  He was started on Bactrim and Keflex along with mupirocin ointment.  He was also given crutches to help with walking.Patient was prescribed a crutches. They were diagnosed with left leg cellulitis and swelling which requires stabilization from this orthosis. The patient has the potential to benefit functionally from the use of the orthosis.  Patient was encouraged to immediately return to the ED once his affairs run order or if his symptoms became worse.  He expressed understanding of plan.  All questions were answered to satisfaction.  ?   ?   ?     ?   Clinical Impression:   Clinical Impression   Cellulitis of left lower extremity (Primary)   Left leg swelling      ?   ?   Disposition: Discharged    Jed Limerick, DO

## 2021-12-28 NOTE — ED Triage Notes (Signed)
PT. STATES He GOT BITTEN BY A SPIDER ON His LEFT LEG 2 WEEK AGO. PT. STATES He HAD BLISTERED AREAS TO THE INNER ASPECT OF His LEFT LEG. PT. STATES THE BLISTERS OPENED. YESTERDAY His LEG STARTED TO SWELL. PT. IS HAVING FEVERS AND CHILLS.  PT'S LEG DRAINED PURULENT DRAINAGE.

## 2021-12-28 NOTE — Discharge Instructions (Signed)
Start Bactrim twice a day for the next 10 days.  Start Keflex 4 times a day for the next 10 days.  Use mupirocin ointment on the ulcers 3 times daily.  Seek medical attention immediately when you are able to for inpatient admission and management.  Alternate Tylenol ibuprofen every 4-6 hours as needed for fevers and pains.  Seek medical attention immediately as soon as you are able.

## 2021-12-28 NOTE — ED Nurses Note (Signed)
Pt has signed out AMA and left in the care of his mother, pt has been informed of the risks involved with leaving AMA by myself and the physician, all appropriate documentation has been signed.

## 2021-12-30 ENCOUNTER — Other Ambulatory Visit: Payer: Self-pay

## 2021-12-30 ENCOUNTER — Emergency Department (HOSPITAL_COMMUNITY): Payer: Medicare Other

## 2021-12-30 ENCOUNTER — Encounter (HOSPITAL_COMMUNITY): Payer: Self-pay

## 2021-12-30 ENCOUNTER — Inpatient Hospital Stay (HOSPITAL_COMMUNITY): Payer: Medicare Other | Admitting: INTERNAL MEDICINE

## 2021-12-30 ENCOUNTER — Inpatient Hospital Stay
Admission: EM | Admit: 2021-12-30 | Discharge: 2021-12-31 | DRG: 603 | Discharge status: Against Medical Advice | Payer: Medicare Other | Attending: INTERNAL MEDICINE | Admitting: INTERNAL MEDICINE

## 2021-12-30 DIAGNOSIS — Z79899 Other long term (current) drug therapy: Secondary | ICD-10-CM

## 2021-12-30 DIAGNOSIS — F32A Depression, unspecified: Secondary | ICD-10-CM | POA: Diagnosis present

## 2021-12-30 DIAGNOSIS — Z20822 Contact with and (suspected) exposure to covid-19: Secondary | ICD-10-CM | POA: Diagnosis present

## 2021-12-30 DIAGNOSIS — L03116 Cellulitis of left lower limb: Principal | ICD-10-CM

## 2021-12-30 DIAGNOSIS — L02416 Cutaneous abscess of left lower limb: Secondary | ICD-10-CM | POA: Insufficient documentation

## 2021-12-30 DIAGNOSIS — F419 Anxiety disorder, unspecified: Secondary | ICD-10-CM | POA: Diagnosis present

## 2021-12-30 DIAGNOSIS — Z5329 Procedure and treatment not carried out because of patient's decision for other reasons: Secondary | ICD-10-CM | POA: Diagnosis not present

## 2021-12-30 DIAGNOSIS — L0390 Cellulitis, unspecified: Secondary | ICD-10-CM | POA: Diagnosis present

## 2021-12-30 DIAGNOSIS — E871 Hypo-osmolality and hyponatremia: Secondary | ICD-10-CM

## 2021-12-30 DIAGNOSIS — J449 Chronic obstructive pulmonary disease, unspecified: Secondary | ICD-10-CM | POA: Diagnosis present

## 2021-12-30 DIAGNOSIS — F17210 Nicotine dependence, cigarettes, uncomplicated: Secondary | ICD-10-CM | POA: Diagnosis present

## 2021-12-30 LAB — COMPREHENSIVE METABOLIC PANEL, NON-FASTING
ALBUMIN: 2.4 g/dL — ABNORMAL LOW (ref 3.5–5.0)
ALKALINE PHOSPHATASE: 89 U/L (ref 45–115)
ALT (SGPT): 18 U/L (ref 10–55)
ANION GAP: 7 mmol/L (ref 4–13)
AST (SGOT): 22 U/L (ref 8–45)
BILIRUBIN TOTAL: 0.2 mg/dL — ABNORMAL LOW (ref 0.3–1.3)
BUN/CREA RATIO: 29 — ABNORMAL HIGH (ref 6–22)
BUN: 21 mg/dL (ref 8–25)
CALCIUM: 8.9 mg/dL (ref 8.5–10.0)
CHLORIDE: 95 mmol/L — ABNORMAL LOW (ref 96–111)
CO2 TOTAL: 22 mmol/L (ref 22–30)
CREATININE: 0.73 mg/dL — ABNORMAL LOW (ref 0.75–1.35)
ESTIMATED GFR: >90 mL/min/BSA (ref >=60)
GLUCOSE: 112 mg/dL (ref 65–125)
POTASSIUM: 4.3 mmol/L (ref 3.5–5.1)
PROTEIN TOTAL: 7.1 g/dL (ref 6.4–8.3)
SODIUM: 124 mmol/L — ABNORMAL LOW (ref 136–145)

## 2021-12-30 LAB — MANUAL DIFFERENTIAL
BAND %: 1 % (ref 0–5)
BASOPHIL %: 1 % (ref 0–1)
BASOPHIL ABSOLUTE: 0.19 10*3/uL (ref 0.00–0.30)
LYMPHOCYTE %: 10 % — ABNORMAL LOW (ref 23–35)
LYMPHOCYTE ABSOLUTE: 1.91 10*3/uL (ref 0.90–4.80)
MONOCYTE %: 3 % (ref 0–12)
MONOCYTE ABSOLUTE: 0.57 10*3/uL (ref 0.30–0.90)
NEUTROPHIL %: 85 % — ABNORMAL HIGH (ref 50–70)
NEUTROPHIL ABSOLUTE: 16.43 10*3/uL — ABNORMAL HIGH (ref 1.70–7.00)
WBC MORPHOLOGY COMMENT: NORMAL
WBC: 19.1 10*3/uL

## 2021-12-30 LAB — CBC WITH DIFF
HCT: 32.2 % — ABNORMAL LOW (ref 42.0–52.0)
HGB: 10.5 g/dL — ABNORMAL LOW (ref 13.5–18.0)
MCH: 24.5 pg — ABNORMAL LOW (ref 28.0–33.0)
MCHC: 32.5 g/dL (ref 32.0–37.0)
MCV: 75.4 fL — ABNORMAL LOW (ref 78.0–100.0)
PLATELETS: 388 10*3/uL (ref 130–400)
RBC: 4.27 10*6/uL — ABNORMAL LOW (ref 4.70–5.40)
RDW: 18.2 % — ABNORMAL HIGH (ref 9.9–16.5)
WBC: 19.1 10*3/uL — ABNORMAL HIGH (ref 4.5–11.0)

## 2021-12-30 LAB — COVID-19 ~~LOC~~ MOLECULAR LAB TESTING
INFLUENZA VIRUS TYPE A: NOT DETECTED
INFLUENZA VIRUS TYPE B: NOT DETECTED
RESPIRATORY SYNCTIAL VIRUS (RSV): NOT DETECTED
SARS-CoV-2: NOT DETECTED

## 2021-12-30 LAB — LACTIC ACID LEVEL W/ REFLEX FOR LEVEL >2.0: LACTIC ACID: 1.4 mmol/L (ref 0.5–2.2)

## 2021-12-30 MED ORDER — VANCOMYCIN 1,000 MG INTRAVENOUS INJECTION
15.0000 mg/kg | INTRAVENOUS | Status: AC
Start: 2021-12-30 — End: 2021-12-31
  Administered 2021-12-30: 1000 mg via INTRAVENOUS
  Administered 2021-12-31: 0 mg via INTRAVENOUS
  Filled 2021-12-30: qty 10

## 2021-12-30 MED ORDER — IOPAMIDOL 300 MG IODINE/ML (61 %) INTRAVENOUS SOLUTION
75.0000 mL | INTRAVENOUS | Status: AC
Start: 2021-12-30 — End: 2021-12-30
  Administered 2021-12-30: 75 mL via INTRAVENOUS

## 2021-12-30 MED ORDER — ACETAMINOPHEN 325 MG TABLET
975.0000 mg | ORAL_TABLET | ORAL | Status: AC
Start: 2021-12-30 — End: 2021-12-30
  Administered 2021-12-30: 975 mg via ORAL
  Filled 2021-12-30: qty 3

## 2021-12-30 MED ORDER — ONDANSETRON HCL (PF) 4 MG/2 ML INJECTION SOLUTION
4.0000 mg | INTRAMUSCULAR | Status: AC
Start: 2021-12-31 — End: 2021-12-31
  Administered 2021-12-31: 4 mg via INTRAVENOUS
  Filled 2021-12-30: qty 2

## 2021-12-30 MED ORDER — SODIUM CHLORIDE 0.9 % INTRAVENOUS PIGGYBACK
3.0000 g | INTRAVENOUS | Status: AC
Start: 2021-12-30 — End: 2021-12-30
  Administered 2021-12-30: 3 g via INTRAVENOUS
  Administered 2021-12-30: 0 g via INTRAVENOUS
  Filled 2021-12-30: qty 3000

## 2021-12-30 MED ORDER — QUETIAPINE 25 MG TABLET
200.0000 mg | ORAL_TABLET | ORAL | Status: AC
Start: 2021-12-31 — End: 2021-12-31
  Administered 2021-12-31: 200 mg via ORAL
  Filled 2021-12-30: qty 8

## 2021-12-30 NOTE — Procedures (Signed)
Venous Access  Authorizing Provider:  Marcelline Mates, MD  Performing Provider:  Sherlean Foot, MD    Reason for Provider PIV Insertion: Multiple previous failed attempts, Known difficulty in obtaining venous access and Provider skill required  Patient position: prone  Site Prep: chlorhexidine  Personal Protective measures: gloves and handwashing  Infiltration: None  left 18 G in the Basilic  Number of attempts: 1  Secured by: securement device and Tegaderm IV  Device used: Ultrasound Device        Sherlean Foot, MD   9/2/202320:23

## 2021-12-30 NOTE — ED Nurses Note (Signed)
Dr. Posey Boyer to  place ultrasound iv

## 2021-12-30 NOTE — ED Nurses Note (Signed)
Pt left leg swollen, red, and warm to touch from left knee down to toes. Pt states "Its been draining but it doesn't smell."

## 2021-12-30 NOTE — ED Nurses Note (Signed)
Snack given to pt per request.

## 2021-12-30 NOTE — ED Provider Notes (Signed)
Emergency Department  Provider Note    Name: Bryan [name]      Subjective  History of Present Illness    Bryan [name] is a 46 y.o. male  who presents to the ED today worsening redness and swelling of his left lower leg.  Patient was seen in the emergency department at [facility] on 12/28/2021 for three lesions medial to the left knee that were draining some purulent fluid.  There was surrounding erythema of the lesions.  At that visit he stated that he did not have time to stay in the emergency department for a workup and was discharged on Keflex.  He returns to our emergency department today stating that his symptoms have worsened and he is now experiencing a fever.        Review of Systems    ROS: Other than those specifically stated in the HPI and below, all other systems reviewed and negative.  Review of Systems       Historical Data   Below information reviewed with patient where pertinent:  Past Medical History:   Diagnosis Date    Anxiety     Back pain     COPD (chronic obstructive pulmonary disease) (CMS HCC)     Depression     Osteochondrosis     right foot       Current Outpatient Medications   Medication Sig    acetaminophen (TYLENOL) 325 mg Oral Tablet Take 1 Tablet (325 mg total) by mouth Every 4 hours as needed for Pain    cephalexin (KEFLEX) 500 mg Oral Capsule Take 1 Capsule (500 mg total) by mouth Four times a day for 10 days    HYDROcodone-acetaminophen (NORCO) 5-325 mg Oral Tablet Take 1 Tablet by mouth Every 4 hours as needed for up to 13 doses (Patient not taking: Reported on 12/28/2021)    mupirocin (BACTROBAN) 2 % Ointment Apply topically Three times a day for 10 days Apply topically to left lower leg ulcers.    nicotine (NICODERM CQ) 14 mg/24 hr Transdermal Patch 24 hr Place 1 Patch (14 mg total) on the skin Once a day (Patient not taking: Reported on 12/28/2021)    PARoxetine (PAXIL) 20 mg Oral Tablet Take 1 Tablet (20 mg total) by mouth Every morning    QUEtiapine (SEROQUEL)  200 mg Oral Tablet Take 1 Tablet (200 mg total) by mouth Every night    Saccharomyces boulardii (FLORASTOR) 250 mg Oral Capsule Take 1 Capsule (250 mg total) by mouth Once a day (Patient not taking: Reported on 12/28/2021)    traZODone (DESYREL) 150 mg Oral Tablet 1 Tablet (150 mg total) Every night    trimethoprim-sulfamethoxazole (BACTRIM DS) 160-843m per tablet Take 1 Tablet (160 mg total) by mouth Every 12 hours for 10 days       Allergies   Allergen Reactions    Latex Rash    Adhesive Rash    Tramadol Rash    Meloxicam     Vistaril [Hydroxyzine Hcl] Swelling       Past Surgical History:   Procedure Laterality Date    ANKLE SURGERY      ANKLE SURGERY Right     cleaned out arthritis    HX HERNIA REPAIR      HX LUMBAR SPINE SURGERY      HX ROTATOR CUFF REPAIR Right        Social History     Tobacco Use    Smoking status: Every Day  Packs/day: 1.00     Years: 30.00     Pack years: 30.00     Types: Cigarettes    Smokeless tobacco: Never   Vaping Use    Vaping Use: Some days    Substances: THC   Substance Use Topics    Alcohol use: Never    Drug use: Yes     Frequency: 7.0 times per week     Types: Marijuana, Opioid              Objective  Physical Exam   Filed Vitals:    12/31/21 0234 12/31/21 0400 12/31/21 0500 12/31/21 0514   BP:  (!) 95/58 105/63    Pulse: 68 72 77 94   Resp:    20   Temp: 36.8 C (98.3 F)   37 C (98.6 F)   SpO2: 96% 95% 95% 93%       Physical Exam  Vital signs Reviewed.   Constitutional: NAD.   HENT:   Head: Normocephalic and atraumatic   Mouth/Throat: Oropharynx is clear and moist   Eyes: EOMI, conjunctivae without discharge bilaterally  Neck: Trachea midline   Cardiovascular: RRR, No murmurs, rubs or gallops   Pulmonary/Chest: BS equal bilaterally, good air movement. No respiratory distress. No wheezes, rales or chest tenderness   Abdominal: BS +. Abdomen soft, no tenderness, rebound or guarding  Musculoskeletal: No edema, tenderness or deformity  Extremities:  There is extensive  cellulitic changes of the left lower extremity with edema and erythema.  There are three lesions medial to the left knee draining purulent material.  Skin: warm and dry. No rash, erythema, pallor or cyanosis  Psychiatric: normal mood and affect. Behavior is normal   Neurological: Alert&Ox3. Grossly intact         Orders and Results  Patient Data    Labs Ordered/Reviewed   COMPREHENSIVE METABOLIC PANEL, NON-FASTING - Abnormal; Notable for the following components:       Result Value    SODIUM 124 (*)     CHLORIDE 95 (*)     CREATININE 0.73 (*)     BUN/CREA RATIO 29 (*)     ALBUMIN 2.4 (*)     BILIRUBIN TOTAL 0.2 (*)     All other components within normal limits   CBC WITH DIFF - Abnormal; Notable for the following components:    WBC 19.1 (*)     RBC 4.27 (*)     HGB 10.5 (*)     HCT 32.2 (*)     MCV 75.4 (*)     MCH 24.5 (*)     RDW 18.2 (*)     All other components within normal limits   MANUAL DIFFERENTIAL - Abnormal; Notable for the following components:    NEUTROPHIL % 85 (*)     LYMPHOCYTE % 10 (*)     NEUTROPHIL ABSOLUTE 16.43 (*)     All other components within normal limits   LACTIC ACID LEVEL W/ REFLEX FOR LEVEL >2.0 - Normal   COVID-19 [place] MOLECULAR LAB TESTING - Normal    Narrative:     Results are for the simultaneous qualitative identification of SARS-CoV-2 (formerly 2019-nCoV), Influenza A, Influenza B, and RSV RNA. These etiologic agents are generally detectable in nasopharyngeal and nasal swabs during the ACUTE PHASE of infection. Hence, this test is intended to be performed on respiratory specimens collected from individuals with signs and symptoms of upper respiratory tract infection who meet Centers for Disease Control and Prevention (  CDC) clinical and/or epidemiological criteria for Coronavirus Disease 2019 (COVID-19) testing. CDC COVID-19 criteria for testing on human specimens is available at [facility] webpage information for Healthcare Professionals: Coronavirus Disease 2019 (COVID-19)  ([email or web address]).                                     False-negative results may occur if the virus has genomic mutations, insertions, deletions, or rearrangements or if performed very early in the course of illness. Otherwise, negative results indicate virus specific RNA targets are not detected, however negative results do not preclude SARS-CoV-2 infection/COVID-19, Influenza, or Respiratory syncytial virus infection. Results should not be used as the sole basis for patient management decisions. Negative results must be combined with clinical observations, patient history, and epidemiological information. If upper respiratory tract infection is still suspected based on exposure history together with other clinical findings, re-testing should be considered.                                    Disclaimer:                   This assay has been authorized by FDA under an Emergency Use Authorization for use in laboratories certified under the Clinical Laboratory Improvement Amendments of 1988 (CLIA), 42 U.S.C. [phone], to perform high complexity tests. The impacts of vaccines, antiviral therapeutics, antibiotics, chemotherapeutic or immunosuppressant drugs have not been evaluated.                                     Test methodology:                   Cepheid Xpert Xpress SARS-CoV-2/Flu/RSV Assay real-time polymerase chain reaction (RT-PCR) test on the GeneXpert Dx and Xpert Xpress systems.   ADULT ROUTINE BLOOD CULTURE, SET OF 2 BOTTLES (BACTERIA AND YEAST)   ADULT ROUTINE BLOOD CULTURE, SET OF 2 BOTTLES (BACTERIA AND YEAST)   CBC/DIFF    Narrative:     The following orders were created for panel order CBC/DIFF.                  Procedure                               Abnormality         Status                                     ---------                               -----------         ------                                     CBC WITH JSHF[[id number]]                Abnormal             Final result  MANUAL DIFFERENTIAL[[id number]]          Abnormal            Final result                                                 Please view results for these tests on the individual orders.       Imaging:  CT EXTREMITY LOWER LEFT W IV CONTRAST   Final Result by Edi, [facility] In (09/02 2124)   1.THERE IS EXTENSIVE SOFT TISSUE EDEMA INVOLVING THE ENTIRE LEFT LOWER EXTREMITY. THIS IS NONSPECIFIC AS TO THE ETIOLOGY. THE DIFFERENTIAL DIAGNOSIS WOULD INCLUDE INFECTIOUS AND INFLAMMATORY ETIOLOGIES   2.NO EVIDENCE OF OSTEOMYELITIS, FRACTURE, OR ABSCESS          One or more dose reduction techniques were used (e.g., Automated exposure control, adjustment of the mA and/or kV according to patient size, use of iterative reconstruction technique).         Radiologist location ID: [id number]         ED US GUIDE VASCULAR ACCESS   Final Result by Micah Noel, MD (09/02 2023)           Nursing notes, labs, and imaging reviewed.    Orders:  Orders Placed This Encounter    BEDSIDE  VENOUS ACCESS    ADULT ROUTINE BLOOD CULTURE, SET OF 2 ADULT BOTTLES (BACTERIA AND YEAST)    ADULT ROUTINE BLOOD CULTURE, SET OF 2 ADULT BOTTLES (BACTERIA AND YEAST)    CANCELED: CT EXTREMITY LOWER BILATERAL W IV CONTRAST    ED US GUIDE VASCULAR ACCESS    CT EXTREMITY LOWER LEFT W IV CONTRAST    CBC/DIFF    COMPREHENSIVE METABOLIC PANEL, NON-FASTING    LACTIC ACID LEVEL W/ REFLEX FOR LEVEL >2.0    COVID-19 SCREENING- Asymptomatic    CBC WITH DIFF    MANUAL DIFFERENTIAL    CBC/DIFF    BASIC METABOLIC PANEL, NON-FASTING    VANCOMYCIN, TROUGH    VANCOMYCIN PEAK    CREATININE WITH EGFR    OXYGEN PROTOCOL    PATIENT CLASS/LEVEL OF CARE DESIGNATION - [facility]    acetaminophen (TYLENOL) tablet    ampicillin-sulbactam (UNASYN) 3 g in NS 100 mL IVPB minibag    vancomycin (VANCOCIN) 1,000 mg in NS 250 mL IVPB    iopamidol (ISOVUE-300) 61% infusion    ondansetron (ZOFRAN) 2 mg/mL injection    QUEtiapine (SEROQUEL)  tablet    QUEtiapine (SEROQUEL) tablet    PARoxetine (PAXIL) tablet    traZODone (DESYREL) tablet    ondansetron (ZOFRAN) 2 mg/mL injection    enoxaparin PF (LOVENOX) 40 mg/0.4 mL SubQ injection    acetaminophen (TYLENOL) tablet    NS premix infusion    Vancomycin IV - Pharmacist to Dose per Protocol    ceFAZolin (ANCEF) 1 g in NS 50 mL IVPB minibag    HYDROcodone-acetaminophen (NORCO) 5-325 mg per tablet    vancomycin (VANCOCIN) 1,000 mg in NS 250 mL IVPB           Medical Decision Making   Medical Decision Making  In brief, Lochlan Grygiel is a 46 y.o. male who presented to the ED for worsening redness and swelling of his left lower leg as well as a fever.  I ordered a CBC, CMP, blood cultures, lactic acid, and a CT scan  of the left lower extremity.  The CBC showed an elevated white blood cell count of 19.1 with elevated absolute neutrophils of 16.43.  The metabolic panel showed a sodium of 124.  The CT scan showed extensive soft tissue edema involving the entire left lower extremity.  There was no evidence of osteomyelitis, fracture, or abscess.  He was started on Unasyn and vancomycin.  I spoke with the physician's assistant covering for the hospitalist, [place], [place], who accepted him for admission.      Clinical Impression:     Clinical Impression   Cellulitis of left lower extremity (Primary)   Hyponatremia       Disposition: Admitted    Follow up:   No follow-up provider specified.                   Medical Decision Making  Problems Addressed:  Cellulitis of left lower extremity: acute illness or injury  Hyponatremia: acute illness or injury    Amount and/or Complexity of Data Reviewed  External Data Reviewed: labs, radiology, ECG and notes.  Labs: ordered. Decision-making details documented in ED Course.  Radiology: ordered and independent interpretation performed. Decision-making details documented in ED Course.    Risk  OTC drugs.  Prescription drug management.  Decision regarding  hospitalization.         History reviewed in chart.  Parts of this patient's chart were completed in a retrospective fashion due to simultaneous direct patient care activities in the Emergency Department.           Procedures

## 2021-12-30 NOTE — ED Nurses Note (Signed)
Pt has dressing to left knee. Dressing removed per physician. Pt has 2 open areas to lateral side of knee draining foul smelling purulent drainage.

## 2021-12-30 NOTE — ED Triage Notes (Signed)
[facility] ems "Pt called for a insect bite. Pts left leg red swollen and warm to touch."  Pt has history of drug use.

## 2021-12-30 NOTE — ED Nurses Note (Signed)
Pt in ct 

## 2021-12-30 NOTE — ED Nurses Note (Signed)
Report given to Megan RN

## 2021-12-30 NOTE — ED Nurses Note (Signed)
Blood cultures drawn with ultrasound placement.

## 2021-12-31 DIAGNOSIS — L03116 Cellulitis of left lower limb: Secondary | ICD-10-CM | POA: Diagnosis present

## 2021-12-31 DIAGNOSIS — A419 Sepsis, unspecified organism: Secondary | ICD-10-CM

## 2021-12-31 DIAGNOSIS — F32A Depression, unspecified: Secondary | ICD-10-CM | POA: Diagnosis present

## 2021-12-31 DIAGNOSIS — L0390 Cellulitis, unspecified: Secondary | ICD-10-CM | POA: Diagnosis present

## 2021-12-31 DIAGNOSIS — F17210 Nicotine dependence, cigarettes, uncomplicated: Secondary | ICD-10-CM

## 2021-12-31 DIAGNOSIS — L02416 Cutaneous abscess of left lower limb: Secondary | ICD-10-CM

## 2021-12-31 DIAGNOSIS — F418 Other specified anxiety disorders: Secondary | ICD-10-CM

## 2021-12-31 DIAGNOSIS — J449 Chronic obstructive pulmonary disease, unspecified: Secondary | ICD-10-CM | POA: Diagnosis present

## 2021-12-31 MED ORDER — HYDROCODONE 5 MG-ACETAMINOPHEN 325 MG TABLET
1.0000 | ORAL_TABLET | ORAL | Status: DC | PRN
Start: 2021-12-31 — End: 2022-01-01
  Administered 2021-12-31 (×2): 1 via ORAL
  Filled 2021-12-31 (×2): qty 1

## 2021-12-31 MED ORDER — ENOXAPARIN 40 MG/0.4 ML SUBCUTANEOUS SYRINGE
40.0000 mg | INJECTION | SUBCUTANEOUS | Status: DC
Start: 2021-12-31 — End: 2022-01-01
  Administered 2021-12-31: 40 mg via SUBCUTANEOUS
  Filled 2021-12-31: qty 0.4

## 2021-12-31 MED ORDER — SODIUM CHLORIDE 0.9 % INTRAVENOUS PIGGYBACK
1.0000 g | Freq: Three times a day (TID) | INTRAVENOUS | Status: DC
Start: 2021-12-31 — End: 2022-01-01
  Administered 2021-12-31: 1 g via INTRAVENOUS
  Administered 2021-12-31: 0 g via INTRAVENOUS
  Administered 2021-12-31: 1 g via INTRAVENOUS
  Administered 2021-12-31 (×2): 0 g via INTRAVENOUS
  Administered 2021-12-31: 1 g via INTRAVENOUS
  Filled 2021-12-31 (×5): qty 10

## 2021-12-31 MED ORDER — VANCOMYCIN IV - PHARMACIST TO DOSE PER PROTOCOL
Freq: Every day | Status: DC | PRN
Start: 2021-12-31 — End: 2022-01-01

## 2021-12-31 MED ORDER — PAROXETINE 20 MG TABLET
20.0000 mg | ORAL_TABLET | Freq: Every morning | ORAL | Status: DC
Start: 2021-12-31 — End: 2022-01-01
  Administered 2021-12-31: 20 mg via ORAL
  Filled 2021-12-31: qty 1

## 2021-12-31 MED ORDER — QUETIAPINE 25 MG TABLET
200.0000 mg | ORAL_TABLET | Freq: Every evening | ORAL | Status: DC
Start: 2021-12-31 — End: 2022-01-01
  Administered 2021-12-31: 200 mg via ORAL
  Filled 2021-12-31: qty 8

## 2021-12-31 MED ORDER — TRAZODONE 50 MG TABLET
150.0000 mg | ORAL_TABLET | Freq: Every evening | ORAL | Status: DC
Start: 2021-12-31 — End: 2022-01-01
  Administered 2021-12-31: 150 mg via ORAL
  Filled 2021-12-31: qty 3

## 2021-12-31 MED ORDER — ACETAMINOPHEN 325 MG TABLET
650.0000 mg | ORAL_TABLET | ORAL | Status: DC | PRN
Start: 2021-12-31 — End: 2022-01-01
  Administered 2021-12-31 (×2): 650 mg via ORAL
  Filled 2021-12-31 (×2): qty 2

## 2021-12-31 MED ORDER — VANCOMYCIN 1,000 MG INTRAVENOUS INJECTION
15.0000 mg/kg | Freq: Three times a day (TID) | INTRAVENOUS | Status: DC
Start: 2021-12-31 — End: 2022-01-01
  Administered 2021-12-31: 1000 mg via INTRAVENOUS
  Administered 2021-12-31: 0 mg via INTRAVENOUS
  Administered 2021-12-31: 1000 mg via INTRAVENOUS
  Administered 2021-12-31: 0 mg via INTRAVENOUS
  Administered 2022-01-01 (×2): 1000 mg via INTRAVENOUS
  Filled 2021-12-31 (×5): qty 10

## 2021-12-31 MED ORDER — SODIUM CHLORIDE 0.9 % INTRAVENOUS SOLUTION
INTRAVENOUS | Status: DC
Start: 2021-12-31 — End: 2022-01-01
  Filled 2021-12-31: qty 1000

## 2021-12-31 MED ORDER — ONDANSETRON HCL (PF) 4 MG/2 ML INJECTION SOLUTION
4.0000 mg | Freq: Four times a day (QID) | INTRAMUSCULAR | Status: DC | PRN
Start: 2021-12-31 — End: 2022-01-01

## 2021-12-31 NOTE — Nurses Notes (Signed)
Myself and house supervisor Damita Lack and security approached pt after PCT reports that he definitely has a pink vape that he had it on his bedside stand along with his inhaler.  Informed pt that if he does have vapes that they would have to be removed from his room.  He denied having them again.  I specifically asked him for the pink vape which he finally handed over.  While he was going through his bag to get the pink vape I noticed several other "vaps" in a small un zipped black case.  We then insisted to search his other bags which he denied [place] going through them.   We informed him that to ensure his safety along with other clients and staff that we would have to search his belongings.  He then states that he is go to just leave the hospital.  Benefits of staying for further treatment  and risks of leaving discussed with pt by myself and Bri Kornetti PA-C.  Pt still wish to leave AMA.  AMA papers signed belonging gathered pt assisted off floor via WC at 2210.  Supervisor provided him with a cab voucher Eddie Candle, RN

## 2021-12-31 NOTE — ED Nurses Note (Signed)
Called for Breakfast tray

## 2021-12-31 NOTE — Discharge Summary (Signed)
[facility]  DISCHARGE SUMMARY    PATIENT NAME:  Bryan [name], Bryan [name]  MRN:  [id number]  DOB:  [birth year]    ENCOUNTER DATE:  12/30/2021  INPATIENT ADMISSION DATE: 12/31/2021  DISCHARGE DATE:  12/31/2021    ATTENDING PHYSICIAN: Jethro Bolus, DO  SERVICE: [facility] HOSPITALIST  PRIMARY CARE PHYSICIAN: No Pcp       No lay caregiver identified.    PRIMARY DISCHARGE DIAGNOSIS: Cellulitis  Active Hospital Problems    Diagnosis Date Noted    Principal Problem: Cellulitis [L03.90] 12/31/2021    COPD (chronic obstructive pulmonary disease) (CMS HCC) [J44.9] 12/31/2021    Depression [F32.A] 12/31/2021    Abscess of left leg [L02.416] 12/31/2021      Resolved Hospital Problems   No resolved problems to display.     Active Non-Hospital Problems    Diagnosis Date Noted    Tobacco use disorder 07/28/2021    Osteomyelitis (CMS HCC) 07/27/2021           Current Discharge Medication List        CONTINUE these medications - NO CHANGES were made during your visit.        Details   acetaminophen 325 mg Tablet  Commonly known as: TYLENOL   325 mg, Oral, EVERY 4 HOURS PRN  Refills: 0     cephalexin 500 mg Capsule  Commonly known as: KEFLEX   500 mg, Oral, 4 TIMES DAILY  Qty: 40 Capsule  Refills: 0     diphenhydrAMINE 50 mg Capsule  Commonly known as: BENADRYL   50 mg, Oral, NIGHTLY PRN  Refills: 0     HYDROcodone-acetaminophen 5-325 mg Tablet  Commonly known as: NORCO   1 Tablet, Oral, EVERY 4 HOURS PRN  Qty: 13 Tablet  Refills: 0     mupirocin 2 % Ointment  Commonly known as: BACTROBAN   Apply Topically, 3 TIMES DAILY, Apply topically to left lower leg ulcers.  Qty: 22 g  Refills: 0     nicotine 14 mg/24 hr Patch 24 hr  Commonly known as: NICODERM CQ   14 mg, Transdermal, DAILY  Refills: 0     PARoxetine 20 mg Tablet  Commonly known as: PAXIL   20 mg, Oral, EVERY MORNING  Refills: 0     Saccharomyces boulardii 250 mg Capsule  Commonly known as: FLORASTOR   250 mg, Oral, DAILY  Refills: 0     sulfamethoxazole-trimethoprim 800-160 mg  tablet  Commonly known as: BACTRIM DS   160 mg, Oral, EVERY 12 HOURS  Qty: 20 Tablet  Refills: 0     traZODone 150 mg Tablet  Commonly known as: DESYREL   150 mg, NIGHTLY  Refills: 0            ASK your doctor about these medications.        Details   QUEtiapine 200 mg Tablet  Commonly known as: SEROQUEL  Ask about: Which instructions should I use?   200 mg, Oral, NIGHTLY  Refills: 0            Discharge med list refreshed?  YES     Allergies   Allergen Reactions    Latex Rash    Adhesive Rash    Other Rash     Petroleum products    Tramadol Rash    Meloxicam     Vistaril [Hydroxyzine Hcl] Swelling     HOSPITAL PROCEDURE(S):   Orders Placed This Encounter   Procedures    BEDSIDE  VENOUS  ACCESS       REASON FOR HOSPITALIZATION AND HOSPITAL COURSE   BRIEF HPI:  This is a 46 y.o., male admitted for left leg cellulitis.   BRIEF HOSPITAL NARRATIVE: Patient was found to have drug paraphernalia in his room, and when nursing supervisor spoke to him, he stated that he wanted to leave the facility. Discussed with patient the need for further antibiotic treatment and monitoring due to extent of infection, and discussed the possibility of death should he leave the facility before completing treatment. He is alert and oriented x4 at the time of the conversation, and adamantly states that he understands the risks but wants to leave as he feels his privacy had been violated when the drug paraphernalia was confiscated. Supervisor Kathlene November and RN Thayer Ohm present in room for discussion and witnessed patient verbalizing understanding of risk of death. Patient signed AMA forms, and escorted out of the hospital via wheelchair.     TRANSITION/POST DISCHARGE CARE/PENDING TESTS/REFERRALS:     CONDITION ON DISCHARGE:  A. Ambulation: Ambulation with assistive device  B. Self-care Ability: Complete  C. Cognitive Status Alert and Oriented x 3  D. Code status at discharge: Full      LINES/DRAINS/WOUNDS AT DISCHARGE:   Patient Lines/Drains/Airways  Status       Active Line / Dialysis Catheter / Dialysis Graft / Drain / Airway / Wound       Name Placement date Placement time Site Days    Peripheral IV Ultrasound guided Anterior;Left;Proximal;Upper Arm 12/30/21  2020  -- 1                    DISCHARGE DISPOSITION:  AMA  DISCHARGE INSTRUCTIONS:    No discharge procedures on file.       Arnoldo Morale, PA-C    Copies sent to Care Team         Relationship Specialty Notifications Start End    Pcp, No PCP - General   06/07/20             Referring providers can utilize [email or web address] to access their referred [facility] patient's information.

## 2021-12-31 NOTE — ED Nurses Note (Signed)
Rounded on patient.  Reviewed vital signs and treatment plan.  Asked if patient had any needs, especially in the area of toileting, pain management and general comfort.  Addressed issues.  Asked the patient/family if they had any needs before I left the room.  Indicated that I would be back within the hour to evaluate them again and update them on throughput progress.  Call bell within reach.

## 2021-12-31 NOTE — Nurses Notes (Signed)
Approached pt after he gave PCT cigarettes.  PCT informed me that he also has a vape.  Pt denies having any vapes or other drugs.  He states that he left them at home due to not aloud to have them here.  Eddie Candle, RN

## 2021-12-31 NOTE — Nurses Notes (Signed)
Per Dr. Veatrice Kells,  Sr. Jen Mow is aware of consult. Mabeline Caras, RN

## 2021-12-31 NOTE — ED Nurses Note (Signed)
Gave pt ginger ale, ice, blanket, and pillow.

## 2021-12-31 NOTE — ED Nurses Note (Signed)
Provided with breakfast tray. Denies further needs at this time.

## 2021-12-31 NOTE — Care Plan (Signed)
Patient admitted to department today. Patient alert and oriented, calm and cooperative. Patient respirations easy non labored on room air. Heart rate and rhythm regular. Bowel sounds x4. Patient has scabbed wound area to inner aspect of left leg -- swelling and redness mildly outside of outline. Pulses palpable. Patient oriented to room and call bell system. Safety measures maintained. Call bell and personal items within reach. All needs met at this time.

## 2021-12-31 NOTE — ED Nurses Note (Signed)
Report provided to Emily, RN, on 2N.

## 2021-12-31 NOTE — Pharmacy (Signed)
[facility] / Department of Pharmaceutical Services  Therapeutic Drug Monitoring: Vancomycin  12/31/2021      Patient name: Bryan [name], Bryan [name]  Date of Birth:  04/03/1976    Actual Weight:  Weight: 72.6 kg (160 lb) (12/30/21 1934)     BMI:  BMI (Calculated): 22.36 (12/30/21 1934)      Date RPh Current regimen (including mg/kg) Indication &  Organism AUC or trough based dosing Target Levels^ SCr (mg/dL) CrCl* (mL/min) Infectious Laboratory Markers (as applicable)   Measured level(s)   (mcg/mL) Calculated AUC (if AUC based monitoring) Plan & predicted AUC/trough if initial dosing (including when levels are due) Comments   9/2 JJI 1000 mg Q8 Skin/Soft Tissue AUC 400-600 0.73 129.8 WBC:  Procal:  CRP:            523    9/3                 9/4           Pk @ 1000  Tr @ 1400                                                                        ^Target levels depends on dosing and monitoring method, AUC vs. trough based. For AUC based dosing units are mg*h/L. For trough based dosing units are mcg/mL.     *Creatinine clearance is estimated by using the Cockcroft-Gault equation for adult patients and the Brendolyn Patty for pediatric patients.    The decision to discontinue vancomycin therapy will be determined by the primary service.  Please contact the pharmacist with any questions regarding this patient's medication regimen.

## 2021-12-31 NOTE — ED Nurses Note (Signed)
Patient resting quietly in bed.

## 2021-12-31 NOTE — ED Nurses Note (Signed)
Pt called out to void. Pt stood up at bedside to void. Pt tolerated well though his wound started bleeding some. I cleaned the blood. Gave pt sprite, and some snacks. Pt asked for seroquel and something for nausea. I spoke with MD and got orders. Pt wanted door closed. Call light at bedside.

## 2021-12-31 NOTE — H&P (Signed)
Internal Medicine  Hospitalist Admission H&P        Date of Service: 12/31/2021  Bryan [name], Bryan [name], 46 y.o. male  Date of Admission:  12/30/2021  Date of Birth:  01-15-1976  PCP: No Pcp    Information Obtained from: Patient      Chief Complaint:  Left leg pain.      HPI: Bryan [name] is a 46 y.o., White male who presents with left leg pain.  He is a past medical history of COPD, depression, anxiety.  Patient is somnolent at the time of examination, wakes briefly to verbal stimulus.  Then appears to fall back to sleep before answering questions.  Per report he called EMS for an insect bite to his leg.  He had been at [facility] ER on 8/31 for the same complaint, and had declined admission at that time.  He was given p.o. antibiotics and crutches.      Past medical history:       Past Medical History:   Diagnosis Date    Anxiety     Back pain     COPD (chronic obstructive pulmonary disease) (CMS HCC)     Depression     Osteochondrosis     right foot              Past surgical history:     Past Surgical History:   Procedure Laterality Date    ANKLE SURGERY      ANKLE SURGERY Right     cleaned out arthritis    HX HERNIA REPAIR      HX LUMBAR SPINE SURGERY      HX ROTATOR CUFF REPAIR Right               Medication prior to admission     Medications Prior to Admission       Prescriptions    acetaminophen (TYLENOL) 325 mg Oral Tablet    Take 1 Tablet (325 mg total) by mouth Every 4 hours as needed for Pain    cephalexin (KEFLEX) 500 mg Oral Capsule    Take 1 Capsule (500 mg total) by mouth Four times a day for 10 days    HYDROcodone-acetaminophen (NORCO) 5-325 mg Oral Tablet    Take 1 Tablet by mouth Every 4 hours as needed for up to 13 doses    Patient not taking:  Reported on 12/28/2021    mupirocin (BACTROBAN) 2 % Ointment    Apply topically Three times a day for 10 days Apply topically to left lower leg ulcers.    nicotine (NICODERM CQ) 14 mg/24 hr Transdermal Patch 24 hr    Place 1 Patch (14 mg total) on the  skin Once a day    Patient not taking:  Reported on 12/28/2021    PARoxetine (PAXIL) 20 mg Oral Tablet    Take 1 Tablet (20 mg total) by mouth Every morning    QUEtiapine (SEROQUEL) 200 mg Oral Tablet    Take 1 Tablet (200 mg total) by mouth Every night    Saccharomyces boulardii (FLORASTOR) 250 mg Oral Capsule    Take 1 Capsule (250 mg total) by mouth Once a day    Patient not taking:  Reported on 12/28/2021    traZODone (DESYREL) 150 mg Oral Tablet    1 Tablet (150 mg total) Every night    trimethoprim-sulfamethoxazole (BACTRIM DS) 160-800mg  per tablet    Take 1 Tablet (160 mg total) by mouth Every 12 hours for 10  days               Allergies     Allergies   Allergen Reactions    Latex Rash    Adhesive Rash    Tramadol Rash    Meloxicam     Vistaril [Hydroxyzine Hcl] Swelling          Family History    Family Medical History:       Problem Relation (Age of Onset)    Cancer Father              Social History     reports that he has been smoking cigarettes. He has a 30.00 pack-year smoking history. He has never used smokeless tobacco. He reports current drug use. Frequency: 7.00 times per week. Drugs: Marijuana and Opioid. He reports that he does not drink alcohol.      ROS:     Unable to obtain.    Vital signs:    Temperature: 37 C (98.6 F) Heart Rate: 94 BP (Non-Invasive): 105/63   Respiratory Rate: 20 SpO2: 93 %       I personally reviewed all vitals    Physical Exam:     General:  Somnolent, wakes briefly to verbal stimulus.  HEENT: ATNC  Lungs: clear to auscultation bilaterally  Cardiovascular: regular, rate controlled.  Abdomen: soft, nontender, nondistended.  Hypoactive bowel sounds.  Extremities/vascular/skin:  Erythema to left lower extremity with open wounds to medial thigh.  Hot to touch.  Edematous.  Neurologic:  Somnolent.  Musculoskeletal:  No gross deformities.     Labs:    Results for orders placed or performed during the hospital encounter of 12/30/21 (from the past 24 hour(s))   COMPREHENSIVE  METABOLIC PANEL, NON-FASTING   Result Value Ref Range    SODIUM 124 (L) 136 - 145 mmol/L    POTASSIUM 4.3 3.5 - 5.1 mmol/L    CHLORIDE 95 (L) 96 - 111 mmol/L    CO2 TOTAL 22 22 - 30 mmol/L    ANION GAP 7 4 - 13 mmol/L    BUN 21 8 - 25 mg/dL    CREATININE 8.00 (L) 0.75 - 1.35 mg/dL    BUN/CREA RATIO 29 (H) 6 - 22    ESTIMATED GFR >90 >=60 mL/min/BSA    ALBUMIN 2.4 (L) 3.5 - 5.0 g/dL     CALCIUM 8.9 8.5 - 34.9 mg/dL    GLUCOSE 179 65 - 150 mg/dL    ALKALINE PHOSPHATASE 89 45 - 115 U/L    ALT (SGPT) 18 10 - 55 U/L    AST (SGOT)  22 8 - 45 U/L    BILIRUBIN TOTAL 0.2 (L) 0.3 - 1.3 mg/dL    PROTEIN TOTAL 7.1 6.4 - 8.3 g/dL   LACTIC ACID LEVEL W/ REFLEX FOR LEVEL >2.0   Result Value Ref Range    LACTIC ACID 1.4 0.5 - 2.2 mmol/L   COVID-19 SCREENING- Asymptomatic   Result Value Ref Range    SARS-CoV-2 Not Detected Not Detected    INFLUENZA VIRUS TYPE A Not Detected Not Detected    INFLUENZA VIRUS TYPE B Not Detected Not Detected    RESPIRATORY SYNCTIAL VIRUS (RSV) Not Detected Not Detected   CBC WITH DIFF   Result Value Ref Range    WBC 19.1 (H) 4.5 - 11.0 x10^3/uL    RBC 4.27 (L) 4.70 - 5.40 x10^6/uL    HGB 10.5 (L) 13.5 - 18.0 g/dL    HCT 56.9 (L) 79.4 - 52.0 %  MCV 75.4 (L) 78.0 - 100.0 fL    MCH 24.5 (L) 28.0 - 33.0 pg    MCHC 32.5 32.0 - 37.0 g/dL    RDW 28.3 (H) 9.9 - 66.2 %    PLATELETS 388 130 - 400 x10^3/uL   MANUAL DIFFERENTIAL   Result Value Ref Range    NEUTROPHIL % 85 (H) 50 - 70 %    LYMPHOCYTE % 10 (L) 23 - 35 %    MONOCYTE % 3 0 - 12 %    BASOPHIL % 1 0 - 1 %    BAND % 1 0 - 5 %    NEUTROPHIL ABSOLUTE 16.43 (H) 1.70 - 7.00 x10^3/uL    LYMPHOCYTE ABSOLUTE 1.91 0.90 - 4.80 x10^3/uL    MONOCYTE ABSOLUTE 0.57 0.30 - 0.90 x10^3/uL    BASOPHIL ABSOLUTE 0.19 0.00 - 0.30 x10^3/uL    HYPOCHROMASIA Present     WBC MORPHOLOGY COMMENT Normal     WBC 19.1 x10^3/uL       I personally reviewed all labs       Imaging Studies:   CT EXTREMITY LOWER LEFT W IV CONTRAST   Final Result   1.THERE IS EXTENSIVE SOFT TISSUE EDEMA  INVOLVING THE ENTIRE LEFT LOWER EXTREMITY. THIS IS NONSPECIFIC AS TO THE ETIOLOGY. THE DIFFERENTIAL DIAGNOSIS WOULD INCLUDE INFECTIOUS AND INFLAMMATORY ETIOLOGIES   2.NO EVIDENCE OF OSTEOMYELITIS, FRACTURE, OR ABSCESS          One or more dose reduction techniques were used (e.g., Automated exposure control, adjustment of the mA and/or kV according to patient size, use of iterative reconstruction technique).         Radiologist location ID: [id number]         ED US GUIDE VASCULAR ACCESS   Final Result          I personally reviewed all imaging       DNR Status:  Full Code      Problem List:     Active Hospital Problems    Diagnosis    Primary Problem: Cellulitis    COPD (chronic obstructive pulmonary disease) (CMS HCC)    Depression       Assessment/Plan:      Sepsis secondary to left leg cellulitis  Wound culture from [facility] ER reviewed, positive for MRSA.  Antibiotic coverage with vancomycin, will also add cefazolin for other skin contaminants.  Wound care.  IV fluids ordered.  Supportive care.  Pain control p.r.n.Marland Kitchen CT reviewed, soft tissue edema noted, no other acute changes.     COPD   No acute exacerbation at this time.    Depression   Continue Seroquel, trazodone, Paxil.    DVT/PE Prophylaxis: Enoxaparin    Arnoldo Morale, PA-C    I independently of the faculty provider spent a total of 45 minutes in direct/indirect care of this patient including initial evaluation, review of laboratory, radiology, diagnostic studies, review of medical record, order entry and coordination of care.    I personally saw and evaluated the patient as part of a shared service with an APP.    My substantive findings are:  SUBSTANTIVE FINDINGS: Exam (complete) .     General:  Mild acute distress, alert and oriented x3   HEENT:  Normocephalic atraumatic,   PERRLA, EOMI, mucous membranes moist  Cardiac:  Regular rate and rhythm with no murmurs  Pulmonology:  Clear to auscultation bilaterally lungs with no wheezes, rhonchi or  rales.  GI:  Abdomen is soft, nondistended, nontender,  bowel sounds present x4. No masses noted.  GU: not examined  Extremities:  Erythema to the majority of the left leg.  There are pen markings from couple days ago to outline the erythema.  Candida palpation in the left leg.  Open sores on the medial aspect of the left knee purulent drainage noted.    I independently of the APP spent a total of (15) minutes in direct/indirect care of this patient including initial evaluation, review of laboratory, radiology, diagnostic studies, review of medical record, order entry and coordination of care.

## 2021-12-31 NOTE — ED Nurses Note (Signed)
Gave report to Samantha, RN, ED.

## 2021-12-31 NOTE — ED Nurses Note (Signed)
Pt is in bed resting. Eyes closed. Respirations seen.

## 2021-12-31 NOTE — ED Nurses Note (Signed)
Gave a pillow to the pt. He is c/o pain. I told him I would inform the MD. Pt has chills and so I got a temperature.

## 2021-12-31 NOTE — ED Nurses Note (Signed)
Rounded on patient.  Reviewed vital signs and treatment plan.  Asked if patient had any needs, especially in the area of toileting, pain management and general comfort.  Addressed issues.  Asked the patient/family if they had any needs before I left the room.  Indicated that I would be back within the hour to evaluate them again and update them on progress.  Call bell within reach.

## 2021-12-31 NOTE — Consults (Signed)
[facility]  SURGERY INITIAL CONSULT      Date of Service:  12/31/21  Bryan [name], 46 y.o. male  Date of Admission:  12/30/2021  Date of Birth:  24-Jan-1976    Service: General Surgery   PCP:  No Pcp      SUBJECTIVE    HPI:  Bryan [name] is a 46 y.o., White male who presents for evaluation of pain and swelling in relationship to the left lower extremity.  Initially he presented on 12/28/2021 to [facility] for what he thought was maybe a bug bite in relationship to the left lower extremity at that time he did decline admission went home on some oral antibiotics and crutches.  He states that it has progressively gotten worse over the last several days he did present back to the emergency department.      The patient states that there is a significant amount of pressure when he stands on his legs and it does appear to be increasing in swelling.  There does appear to be redness outside of the lines that were previously drawn.      The patient states that he did have some blistering and a small what appeared to be opening to this area he tried to squeeze a significant amount out from this region.  This did cause the redness to significantly swell over the last 24 hours.  He is had some intermittent fevers as well.    Information Obtained from: patient and/or medical record    ROS:   Constitutional:  No fevers chills or weight gain or loss  Cardiovascular:  No no current chest pain  Pulmonary:  History of COPD  Gastrointestinal:   No nausea vomiting change in bowel habits, no diarrhea, no abdominal pain  Genitourinary: no hematuria or difficulty with urination  Extremities:  As above  Neurological: no syncopal episodes  Skin:  As above  Hematological: no previous history of hepatitis or HIV      Past Medical History:   Diagnosis Date    Anxiety     Back pain     COPD (chronic obstructive pulmonary disease) (CMS HCC)     Depression     Osteochondrosis     right foot     Past Surgical  History:   Procedure Laterality Date    ANKLE SURGERY      ANKLE SURGERY Right     cleaned out arthritis    HX HERNIA REPAIR      HX LUMBAR SPINE SURGERY      HX ROTATOR CUFF REPAIR Right      Medications Prior to Admission       Prescriptions    acetaminophen (TYLENOL) 325 mg Oral Tablet    Take 1 Tablet (325 mg total) by mouth Every 4 hours as needed for Pain    cephalexin (KEFLEX) 500 mg Oral Capsule    Take 1 Capsule (500 mg total) by mouth Four times a day for 10 days    diphenhydrAMINE (BENADRYL) 50 mg Oral Capsule    Take 1 Capsule (50 mg total) by mouth Every night as needed    HYDROcodone-acetaminophen (NORCO) 5-325 mg Oral Tablet    Take 1 Tablet by mouth Every 4 hours as needed for up to 13 doses    Patient not taking:  Reported on 12/28/2021    mupirocin (BACTROBAN) 2 % Ointment    Apply topically Three times a day for 10 days Apply topically to  left lower leg ulcers.    nicotine (NICODERM CQ) 14 mg/24 hr Transdermal Patch 24 hr    Place 1 Patch (14 mg total) on the skin Once a day    Patient not taking:  Reported on 12/28/2021    PARoxetine (PAXIL) 20 mg Oral Tablet    Take 1 Tablet (20 mg total) by mouth Every morning    QUEtiapine (SEROQUEL) 200 mg Oral Tablet    Take 1 Tablet (200 mg total) by mouth Every night    Saccharomyces boulardii (FLORASTOR) 250 mg Oral Capsule    Take 1 Capsule (250 mg total) by mouth Once a day    Patient not taking:  Reported on 12/28/2021    traZODone (DESYREL) 150 mg Oral Tablet    1 Tablet (150 mg total) Every night    trimethoprim-sulfamethoxazole (BACTRIM DS) 160-800mg  per tablet    Take 1 Tablet (160 mg total) by mouth Every 12 hours for 10 days          acetaminophen (TYLENOL) tablet, 650 mg, Oral, Q4H PRN  ceFAZolin (ANCEF) 1 g in NS 50 mL IVPB minibag, 1 g, Intravenous, Q8H  enoxaparin PF (LOVENOX) 40 mg/0.4 mL SubQ injection, 40 mg, Subcutaneous, Q24H  HYDROcodone-acetaminophen (NORCO) 5-325 mg per tablet, 1 Tablet, Oral, Q4H PRN  NS premix infusion, , Intravenous,  Continuous  ondansetron (ZOFRAN) 2 mg/mL injection, 4 mg, Intravenous, Q6H PRN  PARoxetine (PAXIL) tablet, 20 mg, Oral, QAM  QUEtiapine (SEROQUEL) tablet, 200 mg, Oral, NIGHTLY  traZODone (DESYREL) tablet, 150 mg, Oral, NIGHTLY  vancomycin (VANCOCIN) 1,000 mg in NS 250 mL IVPB, 15 mg/kg (Adjusted), Intravenous, Q8H  Vancomycin IV - Pharmacist to Dose per Protocol, , Does not apply, Daily PRN      Allergies   Allergen Reactions    Latex Rash    Adhesive Rash    Other Rash     Petroleum products    Tramadol Rash    Meloxicam     Vistaril [Hydroxyzine Hcl] Swelling     family history includes Cancer in his father.   reports that he has been smoking cigarettes. He has a 30.00 pack-year smoking history. He has never used smokeless tobacco. He reports current drug use. Frequency: 7.00 times per week. Drugs: Marijuana and Opioid. He reports that he does not drink alcohol.    PHYSICAL EXAM  Filed Vitals:    12/31/21 0830 12/31/21 0900 12/31/21 0930 12/31/21 1300   BP:  (!) 100/57  (!) 110/57   Pulse: 88 70 72 77   Resp:  16 16 (!) 22   Temp:    36.9 C (98.4 F)   SpO2: 95% 94% 93% 94%       Constitutional: This is a well-developed male,  Who appears to be in no acute distress   Skin:  There is some blister formation in relationship to the medial aspect of the right lower extremity.  There is also some purulent drainage from this region.  He does have multiple tattoos as well.  Cardiovascular:  Heart is regular rate and rhythm without murmurs rubs or gallops  Respiratory:  Lungs are clear to auscultation bilaterally  Extremities:  There is significant swelling in the left lower extremity as compared to the right with the obvious evidence of abscess in relationship to the medial aspect of the left lower extremity.  Neurological:  Patient is grossly intact in relationship to the upper and lower extremities  Psych:   No depressive affect  Vascular:  Patient has palpable dorsalis pedis and posterior tibial pulses  bilaterally  Date 12/30/21 1500 - 12/31/21 0659 12/31/21 0700 - 01/01/22 0659   Shift 1500-2259 2300-0659 24 Hour Total 0700-1459 1500-2259 2300-0659 24 Hour Total   INTAKE   P.O.    360   360     Oral    360   360   IV Piggyback 100 300 400 250 260  510     Volume (ampicillin-sulbactam (UNASYN) 3 g in NS 100 mL IVPB minibag) 100  100         Volume (vancomycin (VANCOCIN) 1,000 mg in NS 250 mL IVPB)  250 250         Volume (ceFAZolin (ANCEF) 1 g in NS 50 mL IVPB minibag)  50 50         Volume (vancomycin (VANCOCIN) 1,000 mg in NS 250 mL IVPB)    250 260  510   Shift Total(mL/kg) 100(1.38) 300(4.13) 400(5.51) 610(8.41) 260(3.58)  870(11.99)   OUTPUT   Urine(mL/kg/hr)  1800(3.1) 1800(1.03) 300(0.52)   300     Urine (Voided)  1800 1800 300   300   Shift Total(mL/kg)  1800(24.8) 1800(24.8) 300(4.13)   300(4.13)   Weight (kg) 72.57 72.57 72.57 72.57 72.57 72.57 72.57       Labs:    Results for orders placed or performed during the hospital encounter of 12/30/21 (from the past 48 hour(s))   COMPREHENSIVE METABOLIC PANEL, NON-FASTING   Result Value Ref Range    SODIUM 124 (L) 136 - 145 mmol/L    POTASSIUM 4.3 3.5 - 5.1 mmol/L    CHLORIDE 95 (L) 96 - 111 mmol/L    CO2 TOTAL 22 22 - 30 mmol/L    ANION GAP 7 4 - 13 mmol/L    BUN 21 8 - 25 mg/dL    CREATININE 0.73 (L) 0.75 - 1.35 mg/dL    BUN/CREA RATIO 29 (H) 6 - 22    ESTIMATED GFR >90 >=60 mL/min/BSA    ALBUMIN 2.4 (L) 3.5 - 5.0 g/dL     CALCIUM 8.9 8.5 - 10.0 mg/dL    GLUCOSE 112 65 - 125 mg/dL    ALKALINE PHOSPHATASE 89 45 - 115 U/L    ALT (SGPT) 18 10 - 55 U/L    AST (SGOT)  22 8 - 45 U/L    BILIRUBIN TOTAL 0.2 (L) 0.3 - 1.3 mg/dL    PROTEIN TOTAL 7.1 6.4 - 8.3 g/dL   LACTIC ACID LEVEL W/ REFLEX FOR LEVEL >2.0   Result Value Ref Range    LACTIC ACID 1.4 0.5 - 2.2 mmol/L   COVID-19 SCREENING- Asymptomatic   Result Value Ref Range    SARS-CoV-2 Not Detected Not Detected    INFLUENZA VIRUS TYPE A Not Detected Not Detected    INFLUENZA VIRUS TYPE B Not Detected Not Detected     RESPIRATORY SYNCTIAL VIRUS (RSV) Not Detected Not Detected   CBC WITH DIFF   Result Value Ref Range    WBC 19.1 (H) 4.5 - 11.0 x10^3/uL    RBC 4.27 (L) 4.70 - 5.40 x10^6/uL    HGB 10.5 (L) 13.5 - 18.0 g/dL    HCT 32.2 (L) 42.0 - 52.0 %    MCV 75.4 (L) 78.0 - 100.0 fL    MCH 24.5 (L) 28.0 - 33.0 pg    MCHC 32.5 32.0 - 37.0 g/dL    RDW 18.2 (H) 9.9 - 16.5 %    PLATELETS 388 130 -  400 x10^3/uL   MANUAL DIFFERENTIAL   Result Value Ref Range    NEUTROPHIL % 85 (H) 50 - 70 %    LYMPHOCYTE % 10 (L) 23 - 35 %    MONOCYTE % 3 0 - 12 %    BASOPHIL % 1 0 - 1 %    BAND % 1 0 - 5 %    NEUTROPHIL ABSOLUTE 16.43 (H) 1.70 - 7.00 x10^3/uL    LYMPHOCYTE ABSOLUTE 1.91 0.90 - 4.80 x10^3/uL    MONOCYTE ABSOLUTE 0.57 0.30 - 0.90 x10^3/uL    BASOPHIL ABSOLUTE 0.19 0.00 - 0.30 x10^3/uL    HYPOCHROMASIA Present     WBC MORPHOLOGY COMMENT Normal     WBC 19.1 x10^3/uL       Imaging Studies:    CT EXTREMITY LOWER LEFT W IV CONTRAST   Final Result   1.THERE IS EXTENSIVE SOFT TISSUE EDEMA INVOLVING THE ENTIRE LEFT LOWER EXTREMITY. THIS IS NONSPECIFIC AS TO THE ETIOLOGY. THE DIFFERENTIAL DIAGNOSIS WOULD INCLUDE INFECTIOUS AND INFLAMMATORY ETIOLOGIES   2.NO EVIDENCE OF OSTEOMYELITIS, FRACTURE, OR ABSCESS          One or more dose reduction techniques were used (e.g., Automated exposure control, adjustment of the mA and/or kV according to patient size, use of iterative reconstruction technique).         Radiologist location ID: [id number]         ED US GUIDE VASCULAR ACCESS   Final Result          Assessment/Plan:   Active Hospital Problems    Diagnosis    Primary Problem: Cellulitis    COPD (chronic obstructive pulmonary disease) (CMS HCC)    Depression    Abscess of left leg     Patient was subsequently scheduled for an incision and drainage of the left lower extremity abscess in the a.m..  The risks and benefits of the procedure was discussed extensively with the patient informed consent was obtained he will present for this procedure.  All  questions were answered.      Doristine Locks, MD

## 2022-01-01 ENCOUNTER — Other Ambulatory Visit: Payer: Self-pay

## 2022-01-01 ENCOUNTER — Encounter (HOSPITAL_COMMUNITY): Payer: Self-pay

## 2022-01-01 ENCOUNTER — Inpatient Hospital Stay (HOSPITAL_COMMUNITY): Admit: 2022-01-01 | Payer: Self-pay | Admitting: Surgery

## 2022-01-01 ENCOUNTER — Emergency Department (HOSPITAL_COMMUNITY)
Admission: RE | Admit: 2022-01-01 | Discharge: 2022-01-01 | Disposition: A | Discharge status: Home / Self Care | Payer: Medicare Other | Source: Ambulatory Visit

## 2022-01-01 ENCOUNTER — Inpatient Hospital Stay
Admission: EM | Admit: 2022-01-01 | Discharge: 2022-01-03 | DRG: 872 | Disposition: A | Discharge status: Home / Self Care | Payer: Medicare Other | Attending: Family Medicine | Admitting: Family Medicine

## 2022-01-01 DIAGNOSIS — B9689 Other specified bacterial agents as the cause of diseases classified elsewhere: Secondary | ICD-10-CM

## 2022-01-01 DIAGNOSIS — A419 Sepsis, unspecified organism: Secondary | ICD-10-CM | POA: Diagnosis present

## 2022-01-01 DIAGNOSIS — F32A Depression, unspecified: Secondary | ICD-10-CM | POA: Diagnosis present

## 2022-01-01 DIAGNOSIS — L03116 Cellulitis of left lower limb: Secondary | ICD-10-CM

## 2022-01-01 DIAGNOSIS — M7989 Other specified soft tissue disorders: Secondary | ICD-10-CM

## 2022-01-01 DIAGNOSIS — L0390 Cellulitis, unspecified: Secondary | ICD-10-CM

## 2022-01-01 DIAGNOSIS — E8809 Other disorders of plasma-protein metabolism, not elsewhere classified: Secondary | ICD-10-CM

## 2022-01-01 DIAGNOSIS — F419 Anxiety disorder, unspecified: Secondary | ICD-10-CM | POA: Diagnosis present

## 2022-01-01 DIAGNOSIS — E872 Acidosis, unspecified: Secondary | ICD-10-CM

## 2022-01-01 DIAGNOSIS — R238 Other skin changes: Secondary | ICD-10-CM

## 2022-01-01 DIAGNOSIS — D509 Iron deficiency anemia, unspecified: Secondary | ICD-10-CM | POA: Diagnosis present

## 2022-01-01 DIAGNOSIS — D72829 Elevated white blood cell count, unspecified: Secondary | ICD-10-CM

## 2022-01-01 DIAGNOSIS — F17210 Nicotine dependence, cigarettes, uncomplicated: Secondary | ICD-10-CM | POA: Diagnosis present

## 2022-01-01 DIAGNOSIS — A4102 Sepsis due to Methicillin resistant Staphylococcus aureus: Principal | ICD-10-CM | POA: Diagnosis present

## 2022-01-01 DIAGNOSIS — Z79899 Other long term (current) drug therapy: Secondary | ICD-10-CM

## 2022-01-01 LAB — COMPREHENSIVE METABOLIC PANEL, NON-FASTING
ALBUMIN/GLOBULIN RATIO: 0.9 — ABNORMAL LOW (ref 1.5–2.5)
ALBUMIN: 2.9 g/dL — ABNORMAL LOW (ref 3.5–5.0)
ALKALINE PHOSPHATASE: 121 U/L (ref 38–126)
ALT (SGPT): 26 U/L (ref <50)
ANION GAP: 10 mmol/L (ref 5–19)
AST (SGOT): 28 U/L (ref 17–59)
BILIRUBIN TOTAL: <0.1 mg/dL — ABNORMAL LOW (ref 0.2–1.3)
BUN/CREA RATIO: 16 (ref 6–20)
BUN: 11 mg/dL (ref 9–20)
CALCIUM: 8.1 mg/dL — ABNORMAL LOW (ref 8.4–10.2)
CHLORIDE: 95 mmol/L — ABNORMAL LOW (ref 98–107)
CO2 TOTAL: 27 mmol/L (ref 22–30)
CREATININE: 0.7 mg/dL (ref 0.66–1.20)
ESTIMATED GFR: >60 mL/min/{1.73_m2} (ref >60)
GLUCOSE: 129 mg/dL — ABNORMAL HIGH (ref 74–106)
POTASSIUM: 4.4 mmol/L (ref 3.5–5.1)
PROTEIN TOTAL: 6.1 g/dL — ABNORMAL LOW (ref 6.3–8.2)
SODIUM: 132 mmol/L — ABNORMAL LOW (ref 137–145)

## 2022-01-01 LAB — URINALYSIS, MACRO/MICRO
BILIRUBIN: NEGATIVE mg/dL
BLOOD: NEGATIVE mg/dL
GLUCOSE: NEGATIVE mg/dL
KETONES: NEGATIVE mg/dL
LEUKOCYTES: NEGATIVE WBCs/uL
NITRITE: NEGATIVE
PH: 6.5 (ref 5.0–9.0)
PROTEIN: 20 mg/dL
SPECIFIC GRAVITY: 1.02 (ref 1.003–1.035)
UROBILINOGEN: <2 mg/dL (ref <2.0)

## 2022-01-01 LAB — MANUAL DIFFERENTIAL
BAND %: 1 % (ref 0–11)
EOSINOPHIL %: 1 % (ref 0–4)
EOSINOPHIL ABSOLUTE: 0.22 10*3/uL (ref 0.00–0.60)
LYMPHOCYTE %: 8 % — ABNORMAL LOW (ref 24–44)
LYMPHOCYTE ABSOLUTE: 1.72 10*3/uL (ref 1.10–3.80)
METAMYELOCYTE %: 1 %
METAMYELOCYTE ABSOLUTE: 0.22 10*3/uL
MONOCYTE %: 9 % (ref 0–10)
MONOCYTE ABSOLUTE: 1.94 10*3/uL — ABNORMAL HIGH (ref 0.10–0.80)
NEUTROPHIL %: 80 % — ABNORMAL HIGH (ref 36–66)
NEUTROPHIL ABSOLUTE: 17.42 10*3/uL — ABNORMAL HIGH (ref 1.80–7.50)
PLATELET ESTIMATE: ADEQUATE
WBC MORPHOLOGY COMMENT: NORMAL
WBC: 21.5 10*3/uL

## 2022-01-01 LAB — CBC WITH DIFF
HCT: 28.9 % — ABNORMAL LOW (ref 36.0–46.0)
HGB: 9.5 g/dL — ABNORMAL LOW (ref 13.9–16.3)
MCH: 24.6 pg — ABNORMAL LOW (ref 25.4–34.0)
MCHC: 33 g/dL (ref 30.0–37.0)
MCV: 74.7 fL — ABNORMAL LOW (ref 80.0–100.0)
MPV: 7.2 fL — ABNORMAL LOW (ref 7.5–11.5)
PLATELETS: 417 10*3/uL — ABNORMAL HIGH (ref 130–400)
RBC: 3.87 10*6/uL — ABNORMAL LOW (ref 4.30–5.90)
RDW: 18.6 % — ABNORMAL HIGH (ref 11.5–14.0)
WBC: 21.5 10*3/uL — ABNORMAL HIGH (ref 4.5–11.5)

## 2022-01-01 LAB — PROCALCITONIN: PROCALCITONIN: 2.02 ng/mL — ABNORMAL HIGH (ref 0.00–0.08)

## 2022-01-01 LAB — LACTIC ACID - FIRST REFLEX: LACTIC ACID: 0.9 mmol/L (ref 0.7–2.0)

## 2022-01-01 LAB — LACTIC ACID LEVEL W/ REFLEX FOR LEVEL >2.0: LACTIC ACID: 3.6 mmol/L (ref 0.7–2.0)

## 2022-01-01 LAB — TROPONIN-I: TROPONIN I: <0.01 ng/mL (ref 0.00–0.03)

## 2022-01-01 SURGERY — IRRIGATION AND DEBRIDEMENT LEG
Anesthesia: Monitor Anesthesia Care | Laterality: Left

## 2022-01-01 MED ORDER — DIPHENHYDRAMINE 25 MG CAPSULE
50.0000 mg | ORAL_CAPSULE | Freq: Every evening | ORAL | Status: DC | PRN
Start: 2022-01-01 — End: 2022-01-03
  Administered 2022-01-02: 50 mg via ORAL
  Filled 2022-01-01: qty 2

## 2022-01-01 MED ORDER — ONDANSETRON HCL (PF) 4 MG/2 ML INJECTION SOLUTION
4.0000 mg | Freq: Four times a day (QID) | INTRAMUSCULAR | Status: DC | PRN
Start: 2022-01-01 — End: 2022-01-03

## 2022-01-01 MED ORDER — ACETAMINOPHEN 325 MG TABLET
650.0000 mg | ORAL_TABLET | Freq: Four times a day (QID) | ORAL | Status: DC | PRN
Start: 2022-01-01 — End: 2022-01-03

## 2022-01-01 MED ORDER — QUETIAPINE 100 MG TABLET
200.0000 mg | ORAL_TABLET | Freq: Every evening | ORAL | Status: DC
Start: 2022-01-02 — End: 2022-01-03
  Administered 2022-01-02 (×2): 200 mg via ORAL
  Filled 2022-01-01 (×3): qty 2

## 2022-01-01 MED ORDER — ACETAMINOPHEN 1,000 MG/100 ML (10 MG/ML) INTRAVENOUS SOLUTION
1000.0000 mg | INTRAVENOUS | Status: AC
Start: 2022-01-01 — End: 2022-01-01
  Administered 2022-01-01: 0 mg via INTRAVENOUS
  Administered 2022-01-01: 1000 mg via INTRAVENOUS
  Filled 2022-01-01: qty 100

## 2022-01-01 MED ORDER — TRAZODONE 50 MG TABLET
150.0000 mg | ORAL_TABLET | Freq: Every evening | ORAL | Status: DC
Start: 2022-01-02 — End: 2022-01-03
  Administered 2022-01-02 (×2): 150 mg via ORAL
  Filled 2022-01-01 (×2): qty 1

## 2022-01-01 MED ORDER — HEPARIN (PORCINE) 5,000 UNIT/ML INJECTION SOLUTION
5000.0000 [IU] | Freq: Three times a day (TID) | INTRAMUSCULAR | Status: DC
Start: 2022-01-02 — End: 2022-01-03
  Administered 2022-01-02: 5000 [IU] via SUBCUTANEOUS
  Administered 2022-01-02: 0 [IU] via SUBCUTANEOUS
  Administered 2022-01-02 – 2022-01-03 (×2): 5000 [IU] via SUBCUTANEOUS
  Administered 2022-01-03: 0 [IU] via SUBCUTANEOUS
  Filled 2022-01-01 (×5): qty 1

## 2022-01-01 MED ORDER — VANCOMYCIN 1,000 MG INTRAVENOUS INJECTION
20.0000 mg/kg | INTRAVENOUS | Status: AC
Start: 2022-01-01 — End: 2022-01-02
  Administered 2022-01-02: 0 mg via INTRAVENOUS
  Administered 2022-01-02: 1500 mg via INTRAVENOUS
  Filled 2022-01-01: qty 15

## 2022-01-01 MED ORDER — DOCUSATE SODIUM 100 MG CAPSULE
100.0000 mg | ORAL_CAPSULE | Freq: Two times a day (BID) | ORAL | Status: DC | PRN
Start: 2022-01-01 — End: 2022-01-03

## 2022-01-01 MED ORDER — SODIUM CHLORIDE 0.9 % (FLUSH) INJECTION SYRINGE
10.0000 mL | INJECTION | INTRAMUSCULAR | Status: DC | PRN
Start: 2022-01-01 — End: 2022-01-03

## 2022-01-01 MED ORDER — SODIUM CHLORIDE 0.9 % IV BOLUS
30.0000 mL/kg | INJECTION | Status: AC
Start: 2022-01-01 — End: 2022-01-01
  Administered 2022-01-01: 0 mL via INTRAVENOUS
  Administered 2022-01-01: 2328 mL via INTRAVENOUS

## 2022-01-01 MED ORDER — VANCOMYCIN IV - PHARMACIST TO DOSE PER PROTOCOL
Freq: Every day | Status: AC | PRN
Start: 2022-01-01 — End: ?

## 2022-01-01 MED ORDER — PIPERACILLIN-TAZOBACTAM 4.5 GRAM/100 ML DEXTROSE(ISO-OSM) IV PIGGYBACK
4.5000 g | INJECTION | INTRAVENOUS | Status: AC
Start: 2022-01-01 — End: 2022-01-01
  Administered 2022-01-01: 4.5 g via INTRAVENOUS
  Administered 2022-01-01: 0 g via INTRAVENOUS
  Filled 2022-01-01: qty 100

## 2022-01-01 MED ORDER — MUPIROCIN 2 % TOPICAL OINTMENT
TOPICAL_OINTMENT | Freq: Three times a day (TID) | CUTANEOUS | Status: DC
Start: 2022-01-02 — End: 2022-01-03
  Administered 2022-01-02 (×2): 0 g via TOPICAL
  Filled 2022-01-01: qty 22

## 2022-01-01 MED ORDER — PAROXETINE 20 MG TABLET
20.0000 mg | ORAL_TABLET | Freq: Every morning | ORAL | Status: DC
Start: 2022-01-02 — End: 2022-01-03
  Administered 2022-01-02 – 2022-01-03 (×2): 20 mg via ORAL
  Filled 2022-01-01 (×2): qty 1

## 2022-01-01 MED ORDER — SODIUM CHLORIDE 0.9 % INTRAVENOUS SOLUTION
INTRAVENOUS | Status: AC
Start: 2022-01-02 — End: ?

## 2022-01-01 MED ORDER — CLINDAMYCIN 900 MG/50 ML IN 0.9% SODIUM CHLORIDE INTRAVENOUS PIGGYBACK
900.0000 mg | INJECTION | INTRAVENOUS | Status: AC
Start: 2022-01-01 — End: 2022-01-01
  Administered 2022-01-01: 0 mg via INTRAVENOUS
  Administered 2022-01-01: 900 mg via INTRAVENOUS
  Filled 2022-01-01: qty 50

## 2022-01-01 MED ORDER — PIPERACILLIN-TAZOBACTAM 3.375 GRAM/50 ML DEXTROSE(ISO-OS) IV PIGGYBACK
3.3750 g | INJECTION | Freq: Four times a day (QID) | INTRAVENOUS | Status: DC
Start: 2022-01-02 — End: 2022-01-02
  Administered 2022-01-02: 0 g via INTRAVENOUS
  Administered 2022-01-02: 3.375 g via INTRAVENOUS
  Filled 2022-01-01: qty 50

## 2022-01-01 MED ORDER — OXYCODONE-ACETAMINOPHEN 5 MG-325 MG TABLET
1.0000 | ORAL_TABLET | Freq: Four times a day (QID) | ORAL | Status: DC | PRN
Start: 2022-01-01 — End: 2022-01-02
  Administered 2022-01-02 (×3): 1 via ORAL
  Filled 2022-01-01 (×3): qty 1

## 2022-01-01 MED ORDER — SODIUM CHLORIDE 0.9 % (FLUSH) INJECTION SYRINGE
10.0000 mL | INJECTION | Freq: Three times a day (TID) | INTRAMUSCULAR | Status: DC
Start: 2022-01-02 — End: 2022-01-03
  Administered 2022-01-02: 10 mL
  Administered 2022-01-02 – 2022-01-03 (×4): 0 mL
  Administered 2022-01-03: 10 mL

## 2022-01-01 MED FILL — ceFAZolin 1 gram solution for injection: INTRAMUSCULAR | Qty: 10 | Status: AC

## 2022-01-01 NOTE — ED Nurses Note (Signed)
Pt requesting neck xray

## 2022-01-01 NOTE — ED Triage Notes (Signed)
Infection of LLE, was seen at [facility]

## 2022-01-01 NOTE — ED Provider Notes (Signed)
Bryan [name] - Emergency Department    Attending Physician: Lucia Bitter, D.O.  CC:  Chief Complaint   Patient presents with    Leg Swelling     HPI:  Bryan [name] is a 46 y.o. male who presents to the emergency department with chief complaint of left lower leg swelling and redness for 2-3 weeks per patient's brother. He was seen at [facility] on 8/31 and was given oral antibiotics and left AMA. He states symptoms have continued to worsen. He associates fevers. He denies chills, nausea, vomiting, diarrhea, chest pain, SOB, headache and any other worrisome symptoms.     REVIEW OF SYSTEMS: All systems listed below were reviewed and are negative unless otherwise noted in the report:  [General]  [EYES]  [ENT]  [Cardiac]  [Respiratory]  [Gastrointestinal]  [Genitourinary]  [Musculoskeletal]  [Dermatologic]  [Neurological]  [Endocrine]  [Hematology/Lymph]  [Psychiatric]  [Allergic/Immunologic]      PMH:    Past Medical History:   Diagnosis Date    Anxiety     Back pain     COPD (chronic obstructive pulmonary disease) (CMS HCC)     Depression     Osteochondrosis     right foot         Previous Medications    ACETAMINOPHEN (TYLENOL) 325 MG ORAL TABLET    Take 1 Tablet (325 mg total) by mouth Every 4 hours as needed for Pain    CEPHALEXIN (KEFLEX) 500 MG ORAL CAPSULE    Take 1 Capsule (500 mg total) by mouth Four times a day for 10 days    DIPHENHYDRAMINE (BENADRYL) 50 MG ORAL CAPSULE    Take 1 Capsule (50 mg total) by mouth Every night as needed    HYDROCODONE-ACETAMINOPHEN (NORCO) 5-325 MG ORAL TABLET    Take 1 Tablet by mouth Every 4 hours as needed for up to 13 doses    MUPIROCIN (BACTROBAN) 2 % OINTMENT    Apply topically Three times a day for 10 days Apply topically to left lower leg ulcers.    NICOTINE (NICODERM CQ) 14 MG/24 HR TRANSDERMAL PATCH 24 HR    Place 1 Patch (14 mg total) on the skin Once a day    PAROXETINE (PAXIL) 20 MG ORAL TABLET    Take 1 Tablet (20 mg total) by mouth Every morning    QUETIAPINE  (SEROQUEL) 200 MG ORAL TABLET    Take 1 Tablet (200 mg total) by mouth Every night    SACCHAROMYCES BOULARDII (FLORASTOR) 250 MG ORAL CAPSULE    Take 1 Capsule (250 mg total) by mouth Once a day    TRAZODONE (DESYREL) 150 MG ORAL TABLET    1 Tablet (150 mg total) Every night    TRIMETHOPRIM-SULFAMETHOXAZOLE (BACTRIM DS) 160-800MG PER TABLET    Take 1 Tablet (160 mg total) by mouth Every 12 hours for 10 days       PSH:    Past Surgical History:   Procedure Laterality Date    ANKLE SURGERY      ANKLE SURGERY Right     cleaned out arthritis    HX HERNIA REPAIR      HX LUMBAR SPINE SURGERY      HX ROTATOR CUFF REPAIR Right          Social Hx:    Social History     Socioeconomic History    Marital status: Divorced     Spouse name: Not on file    Number of children: Not on file  Years of education: Not on file    Highest education level: Not on file   Occupational History    Not on file   Tobacco Use    Smoking status: Every Day     Packs/day: 1.00     Years: 30.00     Pack years: 30.00     Types: Cigarettes    Smokeless tobacco: Never   Vaping Use    Vaping Use: Some days    Substances: THC   Substance and Sexual Activity    Alcohol use: Never    Drug use: Yes     Frequency: 7.0 times per week     Types: Marijuana, Opioid    Sexual activity: Yes     Partners: Female   Other Topics Concern    Not on file   Social History Narrative    Not on file     Social Determinants of Health     Financial Resource Strain: Not on file   Transportation Needs: Not on file   Social Connections: Not on file   Intimate Partner Violence: Not on file   Housing Stability: Not on file     Family Hx:   Family History   Problem Relation Age of Onset    Cancer Father      Allergies:   Allergies   Allergen Reactions    Latex Rash    Adhesive Rash    Other Rash     Petroleum products    Tramadol Rash    Meloxicam     Vistaril [Hydroxyzine Hcl] Swelling       Above history reviewed with patient, changes are as documented.    Physical Exam:    Nursing note and vitals reviewed.  ED Triage Vitals [01/01/22 1913]   BP (Non-Invasive) (!) 141/71   Heart Rate (!) 120   Respiratory Rate (!) 22   Temperature (!) 38.9 C (102 F)   SpO2 99 %   Weight 77.6 kg (171 lb)   Height 1.803 m (_0 )     PHYSICAL EXAM:  VITAL SIGNS: Reviewed  GENERAL:  Ill-appearing.  Alert.   EYES: No injection, discharge or icterus.  Pupils equal round and reactive to light bilaterally.  ENT: Mucous membranes pink and moist. Pharynx without erythema or exudate.  External ears without trauma.  NECK: Supple.  No midline cervical tenderness.  RESPIRATORY: Airway patent. Symmetric chest wall rise.  Clear to auscultation bilaterally.  CARDIOVASCULAR: Warm and well perfused.  Regular rate and rhythm with no murmurs gallops or rubs.  ABDOMEN: Non-distended and non-tender.  SKIN: Severe cellulitis lower left extremity to halfway up the thigh. Open draining [name] near left knee crease. Warm, tender to touch, and indurated.   MSK: Without deformity.  2+ pulses bilateral upper and lower extremities.  NEUROLOGICAL: Alert and cooperative.  Strength 5/5 bilateral upper and lower extremities. GCS 15.      Laboratory Results:  Labs Ordered/Reviewed   COMPREHENSIVE METABOLIC PANEL, NON-FASTING - Abnormal; Notable for the following components:       Result Value    SODIUM 132 (*)     CHLORIDE 95 (*)     ALBUMIN 2.9 (*)     CALCIUM 8.1 (*)     GLUCOSE 129 (*)     BILIRUBIN TOTAL <0.1 (*)     PROTEIN TOTAL 6.1 (*)     ALBUMIN/GLOBULIN RATIO 0.9 (*)     All other components within normal limits    Narrative:  Estimated Glomerular Filtration Rate (eGFR) is calculated using the CKD-EPI (2021) equation, intended for patients 66 years of age and older. If gender is not documented or "unknown", there will be no eGFR calculation.   LACTIC ACID LEVEL W/ REFLEX FOR LEVEL >2.0 - Abnormal; Notable for the following components:    LACTIC ACID 3.6 (*)     All other components within normal limits   PROCALCITONIN -  Abnormal; Notable for the following components:    PROCALCITONIN  2.02 (*)     All other components within normal limits   CBC WITH DIFF - Abnormal; Notable for the following components:    WBC 21.5 (*)     RBC 3.87 (*)     HGB 9.5 (*)     HCT 28.9 (*)     MCV 74.7 (*)     MCH 24.6 (*)     RDW 18.6 (*)     PLATELETS 417 (*)     MPV 7.2 (*)     All other components within normal limits   MANUAL DIFFERENTIAL - Abnormal; Notable for the following components:    NEUTROPHIL % 80 (*)     LYMPHOCYTE % 8 (*)     NEUTROPHIL ABSOLUTE 17.42 (*)     MONOCYTE ABSOLUTE 1.94 (*)     HYPOCHROMASIA Present (*)     MICROCYTOSIS Present (*)     OVALOCYTE (ELLIPTOCYTE) Present (*)     All other components within normal limits   TROPONIN-I - Normal   LACTIC ACID - FIRST REFLEX - Normal   ADULT ROUTINE BLOOD CULTURE, SET OF 2 BOTTLES (BACTERIA AND YEAST)   ADULT ROUTINE BLOOD CULTURE, SET OF 2 BOTTLES (BACTERIA AND YEAST)   CBC/DIFF    Narrative:     The following orders were created for panel order CBC/DIFF.  Procedure                               Abnormality         Status                     ---------                               -----------         ------                     CBC WITH UORV[[id number]]                Abnormal            Final result               MANUAL DIFFERENTIAL[[id number]]          Abnormal            Final result                 Please view results for these tests on the individual orders.   URINALYSIS WITH REFLEX MICROSCOPIC AND CULTURE IF POSITIVE    Narrative:     The following orders were created for panel order URINALYSIS WITH REFLEX MICROSCOPIC AND CULTURE IF POSITIVE.  Procedure  Abnormality         Status                     ---------                               -----------         ------                     URINALYSIS, [id number]/[id number][[id number]]                          Final result                 Please view results for these tests on the individual orders.   URINALYSIS,  MACRO/MICRO   MICRO HOLD         Radiographical Imaging:   Results for orders placed or performed during the hospital encounter of 01/01/22   XR TIBIA-FIBULA LEFT     Status: None    Narrative    Tavien ADAM Shenoy    RADIOLOGIST: Betsey Amen, MD    XR TIBIA- FIBULA LEFT 2 VIEWS performed on 01/01/2022 8:46 PM    CLINICAL HISTORY: r/o obvious air.  pt c/o Lt lower leg pain, redness and swelling    TECHNIQUE: 3 views of the left tibia and fibula    COMPARISON:  None.    FINDINGS:   No fracture.  No suspicious bone lesion.  Normal alignment at the knee and ankle.  There is some subcutaneous edema. No soft tissue gas. No radiopaque foreign body.        Impression    As above           Radiologist location ID: [id number] decision making and course:    Pertinent medical records reviewed. Labs ordered and reviewed as above. Imaging ordered and reviewed as above.    Patient arrives with severe cellulitis and fever.  Patient given 30 cc/kilogram IV bolus normal saline for presumed sepsis.  CBC confirms sepsis with leukocytosis 21,000. Lactic acidosis present as well.  Procalcitonin significantly elevated indicating high chance of bacteremia.  CMP unremarkable.  X-ray does not show evidence of soft tissue gas nor does patient have any crepitus or pain out of proportion to exam to suggest necrotizing infection at this time.  Patient at high risk of decompensation without admission for management of his sepsis and IV antibiotics.  I spoke with hospitalist regarding admission and they are agreeable.    EKG- sinus tachycardia, rate 115, no ST elevation or depression, nonspecific ST T wave changes per my interpretation         Disposition: Admitted      Clinical Impression:   Clinical Impression   Sepsis, due to unspecified organism, unspecified whether acute organ dysfunction present (CMS HCC) (Primary)   Cellulitis, unspecified cellulitis site         I am scribing for, and in the presence of, Dr.  Renold Genta for services provided on 01/01/2022.  Selmer Dominion, [place] 01/01/2022, 19:11    I am scribing for, and in the presence of, Dr. Renold Genta for services provided on 01/01/2022.  Hazle Quant SCRIBE 01/01/2022, 19:28    I personally performed the services described in this documentation, as scribed  in my presence, and it  is both accurate  and complete.    Lucia Bitter, DO

## 2022-01-01 NOTE — H&P (Signed)
[facility]   History and Physical      CHIEF COMPLAINT:  leg swelling    HISTORY OF PRESENT ILLNESS:  46 y.o. male with medical history consisting of, but not limited to, osteochondrosis, c/o left leg swelling, warmth, and redness X 3 weeks.    He left AMA from [place] 4 days ago.      PMHx:    Past Medical History:   Diagnosis Date    Anxiety     Back pain     COPD (chronic obstructive pulmonary disease) (CMS HCC)     Depression     Osteochondrosis     right foot     PSHx:   Past Surgical History:   Procedure Laterality Date    ANKLE SURGERY      ANKLE SURGERY Right     cleaned out arthritis    HX HERNIA REPAIR      HX LUMBAR SPINE SURGERY      HX ROTATOR CUFF REPAIR Right        Allergies:    Allergies   Allergen Reactions    Latex Rash    Adhesive Rash    Other Rash     Petroleum products    Tramadol Rash    Meloxicam     Vistaril [Hydroxyzine Hcl] Swelling    Social History  Social History     Tobacco Use    Smoking status: Every Day     Packs/day: 1.00     Years: 30.00     Pack years: 30.00     Types: Cigarettes    Smokeless tobacco: Never   Vaping Use    Vaping Use: Some days    Substances: THC   Substance Use Topics    Alcohol use: Never    Drug use: Yes     Frequency: 7.0 times per week     Types: Marijuana, Opioid       Family History  Family Medical History:       Problem Relation (Age of Onset)    Cancer Father                 REVIEW OF SYSTEMS:  All systems were reviewed with patient and found to be negative, except for what is mentioned above in the HPI.    MEDICATIONS:  Current Outpatient Medications   Medication Instructions    acetaminophen (TYLENOL) 325 mg, Oral, EVERY 4 HOURS PRN    cephalexin (KEFLEX) 500 mg, Oral, 4 TIMES DAILY    diphenhydrAMINE (BENADRYL) 50 mg, Oral, NIGHTLY PRN    HYDROcodone-acetaminophen (NORCO) 5-325 mg Oral Tablet 1 Tablet, Oral, EVERY 4 HOURS PRN    mupirocin (BACTROBAN) 2 % Ointment Apply Topically, 3 TIMES DAILY, Apply topically to left lower leg ulcers.     nicotine (NICODERM CQ) 14 mg, Transdermal, DAILY    PARoxetine (PAXIL) 20 mg, Oral, EVERY MORNING    QUEtiapine (SEROQUEL) 200 mg, Oral, NIGHTLY    Saccharomyces boulardii (FLORASTOR) 250 mg, Oral, DAILY    traZODone (DESYREL) 150 mg, NIGHTLY    trimethoprim-sulfamethoxazole (BACTRIM DS) 160-800mg  per tablet 160 mg, Oral, EVERY 12 HOURS       Medications Administered in the ED   vancomycin (VANCOCIN) 1,500 mg in NS 500 mL IVPB (has no administration in time range)   NS bolus infusion 30 mL/kg = 2,328 mL (2,328 mL Intravenous New Bag/New Syringe 01/01/22 1956)   acetaminophen (OFIRMEV) 1,000 mg (10 mg/mL) IV 100 mL (tot vol) (1,000 mg  Intravenous New Bag/New Syringe 01/01/22 2126)   piperacillin-tazobactam (ZOSYN) 4.5 g in iso-osmotic 100 mL premix IVPB (has no administration in time range)   clindamycin (CLEOCIN) 900 mg in NS 50 mL premix IVPB (has no administration in time range)       PHYSICAL EXAMINATION:  Vital signs reviewed.  Gen : Not in acute distress.  Heent: NC, AT.  Oral mucosa is moist.  Neck: supple, No JVD.  No carotid bruit.  Heart : S1 S2 auscultated, RRR, no murmur  Lungs: No ronchi, rales, wheezes, or crackles  Abdomen: soft, non distended, nontender, normoactive bowels  Neuro: no gross focal motor or sensory deficit, awake, alert, oriented x3.  Musculoskeletal: FROM, no chest wall tenderness. No homan's.  EXT: no clubbing, or cyanosis .  No edema .    Skin: no rash. no jaundice. no pallor  Psych: normal affect, congruent mood.    Labs, Imaging studies, and other diagnostic data reviewed.  Results for orders placed or performed during the hospital encounter of 01/01/22 (from the past 24 hour(s))   ECG 12 LEAD   Result Value Ref Range    Ventricular rate 115 BPM    PR Interval 108 ms    QRS Duration 102 ms    QT Interval 344 ms    QTC Calculation 412 ms    Calculated P Axis 37 degrees    Calculated R Axis 7 degrees    Calculated T Axis 50 degrees   COMPREHENSIVE METABOLIC PANEL, NON-FASTING   Result  Value Ref Range    SODIUM 132 (L) 137 - 145 mmol/L    POTASSIUM 4.4 3.5 - 5.1 mmol/L    CHLORIDE 95 (L) 98 - 107 mmol/L    CO2 TOTAL 27 22 - 30 mmol/L    ANION GAP 10 5 - 19 mmol/L    BUN 11 9 - 20 mg/dL    CREATININE 0.70 0.66 - 1.20 mg/dL    BUN/CREA RATIO 16 6 - 20    ESTIMATED GFR >60 >60 mL/min/1.84m^2    ALBUMIN 2.9 (L) 3.5 - 5.0 g/dL    CALCIUM 8.1 (L) 8.4 - 10.2 mg/dL    GLUCOSE 129 (H) 74 - 106 mg/dL    ALKALINE PHOSPHATASE 121 38 - 126 U/L    ALT (SGPT) 26 <50 U/L    AST (SGOT) 28 17 - 59 U/L    BILIRUBIN TOTAL <0.1 (L) 0.2 - 1.3 mg/dL    PROTEIN TOTAL 6.1 (L) 6.3 - 8.2 g/dL    ALBUMIN/GLOBULIN RATIO 0.9 (L) 1.5 - 2.5   LACTIC ACID LEVEL W/ REFLEX FOR LEVEL >2.0   Result Value Ref Range    LACTIC ACID 3.6 (HH) 0.7 - 2.0 mmol/L   TROPONIN-I   Result Value Ref Range    TROPONIN I <0.01 0.00 - 0.03 ng/mL   PROCALCITONIN   Result Value Ref Range    PROCALCITONIN  2.02 (H) 0.00 - 0.08 ng/mL   CBC WITH DIFF   Result Value Ref Range    WBC 21.5 (H) 4.5 - 11.5 x10^3/uL    RBC 3.87 (L) 4.30 - 5.90 x10^6/uL    HGB 9.5 (L) 13.9 - 16.3 g/dL    HCT 28.9 (L) 36.0 - 46.0 %    MCV 74.7 (L) 80.0 - 100.0 fL    MCH 24.6 (L) 25.4 - 34.0 pg    MCHC 33.0 30.0 - 37.0 g/dL    RDW 18.6 (H) 11.5 - 14.0 %    PLATELETS 417 (  H) 130 - 400 x10^3/uL    MPV 7.2 (L) 7.5 - 11.5 fL   MANUAL DIFFERENTIAL   Result Value Ref Range    NEUTROPHIL % 80 (H) 36 - 66 %    LYMPHOCYTE % 8 (L) 24 - 44 %    MONOCYTE % 9 0 - 10 %    EOSINOPHIL % 1 0 - 4 %    BAND % 1 0 - 11 %    METAMYELOCYTE % 1 %    NEUTROPHIL ABSOLUTE 17.42 (H) 1.80 - 7.50 x10^3/uL    LYMPHOCYTE ABSOLUTE 1.72 1.10 - 3.80 x10^3/uL    MONOCYTE ABSOLUTE 1.94 (H) 0.10 - 0.80 x10^3/uL    EOSINOPHIL ABSOLUTE 0.22 0.00 - 0.60 x10^3/uL    METAMYELOCYTE ABSOLUTE 0.22 x10^3/uL    PLATELET ESTIMATE Adequate     PLATELET SATELLITOSIS      HYPOCHROMASIA Present (A) Absent    MICROCYTOSIS Present (A) Absent    OVALOCYTE (ELLIPTOCYTE) Present (A) Absent    WBC MORPHOLOGY COMMENT Normal     WBC 21.5  x10^3/uL   URINALYSIS, MACRO/MICRO   Result Value Ref Range    COLOR Yellow     APPEARANCE Clear Clear, Hazy, Cloudy, Slightly Cloudy    SPECIFIC GRAVITY 1.020 1.003 - 1.035    PH 6.5 5.0 - 9.0    LEUKOCYTES Negative Negative WBCs/uL    NITRITE Negative Negative    PROTEIN 20 Negative, 10 , 20  mg/dL    GLUCOSE Negative 30 , Negative mg/dL    KETONES Negative Negative mg/dL    UROBILINOGEN < 2.0 < 2.0, 1.0, 0.2  mg/dL    BILIRUBIN Negative Negative mg/dL    BLOOD Negative Negative, Trace mg/dL    RBCS 0-2 0-2, None /hpf    WBCS 0-4 0 - 4 /hpf    MUCOUS Rare None, Rare, Occasional, Few, Mod /hpf   LACTIC ACID - FIRST REFLEX   Result Value Ref Range    LACTIC ACID 0.9 0.7 - 2.0 mmol/L     XR TIBIA-FIBULA LEFT   Final Result by Edi, Radresults In (09/04 2147)   As above                Radiologist location ID: [id number]              ASSESSMENT AND PLAN:  This is a 46 y.o. male with above Hx, physical exam, labs, and imaging findings, now with:    1. Sepsis  2. Left LE cellulitis  3. Leucocytosis due to above  4. Chronic microcytic anemia  5. Abnormal EKG  6. Multiple comorbidities    DVT prophylaxis: Heparin 5000u SQ Q 8h    CODE STATUS: Full code    All questions from the patient were addressed to patient's satisfaction. Further adjustment to the treatment plan will depend on progress, results of pending investigations and recommendations from consultants on the case.    On the date of this encounter, 63 minutes were spent on this patient encounter including review of historical information, post visit activities, examination, ordering medications, tests, procedures, documentation, interpretation of results, communication with other healthcare professionals, counseling and education of patient/family.    This note was dictated with the help of voice recognition software.  Should there be any information in the note that does not appear to make sense, please contact me as soon as possible for clarification or  rectification.    Suzzette Righter, MD  01/01/2022, 23:36

## 2022-01-01 NOTE — Nurses Notes (Signed)
Dr Jen Mow notified of pt leaving AMA on 01/01/22 at 0645. Eddie Candle, RN

## 2022-01-02 ENCOUNTER — Inpatient Hospital Stay (HOSPITAL_COMMUNITY): Payer: Medicare Other

## 2022-01-02 ENCOUNTER — Encounter (HOSPITAL_COMMUNITY): Payer: Self-pay | Admitting: Family Medicine

## 2022-01-02 DIAGNOSIS — R9431 Abnormal electrocardiogram [ECG] [EKG]: Secondary | ICD-10-CM

## 2022-01-02 DIAGNOSIS — A419 Sepsis, unspecified organism: Secondary | ICD-10-CM

## 2022-01-02 DIAGNOSIS — R238 Other skin changes: Secondary | ICD-10-CM

## 2022-01-02 DIAGNOSIS — R000 Tachycardia, unspecified: Secondary | ICD-10-CM

## 2022-01-02 DIAGNOSIS — Z792 Long term (current) use of antibiotics: Secondary | ICD-10-CM

## 2022-01-02 DIAGNOSIS — B9562 Methicillin resistant Staphylococcus aureus infection as the cause of diseases classified elsewhere: Secondary | ICD-10-CM

## 2022-01-02 DIAGNOSIS — M7989 Other specified soft tissue disorders: Secondary | ICD-10-CM

## 2022-01-02 LAB — BASIC METABOLIC PANEL
ANION GAP: 9 mmol/L (ref 5–19)
BUN/CREA RATIO: 12 (ref 6–20)
BUN: 7 mg/dL — ABNORMAL LOW (ref 9–20)
CALCIUM: 7.4 mg/dL — ABNORMAL LOW (ref 8.4–10.2)
CHLORIDE: 102 mmol/L (ref 98–107)
CO2 TOTAL: 23 mmol/L (ref 22–30)
CREATININE: 0.57 mg/dL — ABNORMAL LOW (ref 0.66–1.20)
ESTIMATED GFR: >60 mL/min/{1.73_m2} (ref >60)
GLUCOSE: 83 mg/dL (ref 74–106)
POTASSIUM: 4.3 mmol/L (ref 3.5–5.1)
SODIUM: 134 mmol/L — ABNORMAL LOW (ref 137–145)

## 2022-01-02 LAB — WOUND, SUPERFICIAL/NON-STERILE SITE, AEROBIC CULTURE AND GRAM STAIN

## 2022-01-02 LAB — CBC WITH DIFF
BASOPHIL #: 0.2 10*3/uL (ref 0.00–0.20)
BASOPHIL %: 1 % (ref 0–2)
EOSINOPHIL #: 0.2 10*3/uL (ref 0.00–0.60)
EOSINOPHIL %: 1 % (ref 0–5)
HCT: 29.2 % — ABNORMAL LOW (ref 36.0–46.0)
HGB: 9.6 g/dL — ABNORMAL LOW (ref 13.9–16.3)
LYMPHOCYTE #: 2.7 10*3/uL (ref 1.10–3.80)
LYMPHOCYTE %: 12 % — ABNORMAL LOW (ref 19–46)
MCH: 24.4 pg — ABNORMAL LOW (ref 25.4–34.0)
MCHC: 32.8 g/dL (ref 30.0–37.0)
MCV: 74.4 fL — ABNORMAL LOW (ref 80.0–100.0)
MONOCYTE #: 1.3 10*3/uL — ABNORMAL HIGH (ref 0.10–0.80)
MONOCYTE %: 6 % (ref 4–12)
MPV: 7.6 fL (ref 7.5–11.5)
NEUTROPHIL #: 17.7 10*3/uL — ABNORMAL HIGH (ref 1.80–7.50)
NEUTROPHIL %: 80 % — ABNORMAL HIGH (ref 41–69)
PLATELETS: 375 10*3/uL (ref 130–400)
RBC: 3.92 10*6/uL — ABNORMAL LOW (ref 4.30–5.90)
RDW: 18.6 % — ABNORMAL HIGH (ref 11.5–14.0)
WBC: 22 10*3/uL — ABNORMAL HIGH (ref 4.5–11.5)

## 2022-01-02 LAB — ECG 12 LEAD
Atrial Rate: 86 {beats}/min
Calculated P Axis: 37 degrees
Calculated P Axis: 83 degrees
Calculated R Axis: 7 degrees
Calculated R Axis: 46 degrees
Calculated T Axis: 50 degrees
Calculated T Axis: 38 degrees
PR Interval: 108 ms
PR Interval: 146 ms
QRS Duration: 102 ms
QRS Duration: 102 ms
QT Interval: 344 ms
QT Interval: 404 ms
QTC Calculation: 412 ms
QTC Calculation: 483 ms
Ventricular rate: 115 {beats}/min
Ventricular rate: 86 {beats}/min

## 2022-01-02 LAB — SCAN DIFFERENTIAL
PLATELET ESTIMATE: ADEQUATE
WBC MORPHOLOGY COMMENT: NORMAL

## 2022-01-02 LAB — MAGNESIUM: MAGNESIUM: 1.8 mg/dL (ref 1.6–2.3)

## 2022-01-02 LAB — MICRO HOLD

## 2022-01-02 MED ORDER — QUETIAPINE 100 MG TABLET
100.0000 mg | ORAL_TABLET | Freq: Every day | ORAL | Status: DC
Start: 2022-01-02 — End: 2022-01-03
  Administered 2022-01-02 – 2022-01-03 (×2): 100 mg via ORAL
  Filled 2022-01-02: qty 1

## 2022-01-02 MED ORDER — VANCOMYCIN 10 GRAM INTRAVENOUS SOLUTION
1000.0000 mg | Freq: Three times a day (TID) | INTRAVENOUS | Status: DC
Start: 2022-01-02 — End: 2022-01-03
  Administered 2022-01-02: 1000 mg via INTRAVENOUS
  Administered 2022-01-02 (×2): 0 mg via INTRAVENOUS
  Administered 2022-01-02 – 2022-01-03 (×4): 1000 mg via INTRAVENOUS
  Administered 2022-01-03: 0 mg via INTRAVENOUS
  Filled 2022-01-02 (×5): qty 10

## 2022-01-02 MED ORDER — QUETIAPINE 100 MG TABLET
100.0000 mg | ORAL_TABLET | Freq: Every day | ORAL | Status: DC
Start: 2022-01-02 — End: 2022-01-02

## 2022-01-02 MED ORDER — OXYCODONE-ACETAMINOPHEN 5 MG-325 MG TABLET
2.0000 | ORAL_TABLET | Freq: Four times a day (QID) | ORAL | Status: DC | PRN
Start: 2022-01-02 — End: 2022-01-03
  Administered 2022-01-02 – 2022-01-03 (×4): 2 via ORAL
  Filled 2022-01-02 (×4): qty 2

## 2022-01-02 NOTE — Care Plan (Signed)
POC reviewed will continue to monitor

## 2022-01-02 NOTE — Progress Notes (Signed)
[facility]    Progress Note    Bryan [name]  Date of service: 01/02/2022  Date of Admission:  01/01/2022  Hospital Day:  LOS: 1 day     Subjective:  Patient states some modest improvement in the edema and erythema of his left leg.  It is standard and he asks for something more for pain and Percocet.  His fever resolved.    Vital Signs:  Temp  Avg: 37.8 C (100.1 F)  Min: 37.2 C (99 F)  Max: 38.9 C (102.1 F)    Pulse  Avg: 95  Min: 70  Max: 210 BP  Min: 101/46  Max: 141/71   Resp  Avg: 17.4  Min: 14  Max: 22 SpO2  Avg: 95.9 %  Min: 90 %  Max: 100 %          Physical Exam:  General: no acute distress  Heart:  RRR, no peripheral edema   Lungs:  no wheezes, no rales, non-labored breathing   Abdomen:  Normoactive bowel sounds x 4. Soft, non-tender  Neuro: A&O x 3, Cranial nerves grossly intact    Labs:  CBC Results Differential Results   Recent Results (from the past 30 hour(s))   CBC WITH DIFF    Collection Time: 01/02/22  8:04 AM   Result Value    WBC 22.0 (H)    HGB 9.6 (L)    HCT 29.2 (L)    PLATELETS 375    Recent Results (from the past 30 hour(s))   CBC WITH DIFF    Collection Time: 01/02/22  8:04 AM   Result Value    WBC 22.0 (H)    NEUTROPHIL % 80 (H)    LYMPHOCYTE % 12 (L)    MONOCYTE % 6    EOSINOPHIL % 1    BASOPHIL % 1    BASOPHIL # 0.20        BMP Results Other Chemistries Results   Results for orders placed or performed during the hospital encounter of 01/01/22 (from the past 30 hour(s))   BASIC METABOLIC PANEL, NON-FASTING    Collection Time: 01/02/22  8:04 AM   Result Value    SODIUM 134 (L)    POTASSIUM 4.3    CHLORIDE 102    CO2 TOTAL 23    GLUCOSE 83    BUN 7 (L)    CREATININE 0.57 (L)    Recent Results (from the past 30 hour(s))   MAGNESIUM    Collection Time: 01/02/22  8:04 AM   Result Value    MAGNESIUM 1.8        Liver/Pancreas Enzyme Results Liver Function Results   Recent Results (from the past 30 hour(s))   COMPREHENSIVE METABOLIC PANEL, NON-FASTING    Collection Time: 01/01/22   7:41 PM   Result Value    ALKALINE PHOSPHATASE 121    ALT (SGPT) 26    AST (SGOT) 28    Recent Results (from the past 30 hour(s))   COMPREHENSIVE METABOLIC PANEL, NON-FASTING    Collection Time: 01/01/22  7:41 PM   Result Value    ALBUMIN 2.9 (L)    BILIRUBIN TOTAL <0.1 (L)        Cardiac Results Coags Results   Results for orders placed or performed during the hospital encounter of 01/01/22 (from the past 30 hour(s))   TROPONIN-I    Collection Time: 01/01/22  7:41 PM   Result Value    TROPONIN I <0.01  No results found for this or any previous visit (from the past 30 hour(s)).         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

## 2022-01-02 NOTE — ED Nurses Note (Signed)
Report called  

## 2022-01-02 NOTE — Pharmacy (Signed)
[facility] / Department of Pharmaceutical Services  Therapeutic Drug Monitoring: Vancomycin  01/02/2022      Patient name: Bryan [name], Bryan [name]  Date of Birth:  [birth year]    Actual Weight:  Weight: 84.9 kg (187 lb 2.7 oz) (01/02/22 0143)     BMI:  BMI (Calculated): 26.16 (01/02/22 0143)      Date RPh Current regimen (including mg/kg) Indication &  Organism AUC or trough based dosing Target Levels^ SCr (mg/dL) CrCl* (mL/min) Infectious Laboratory Markers (as applicable)   Measured level(s)   (mcg/mL) Calculated AUC (if AUC based monitoring) Plan & predicted AUC/trough if initial dosing (including when levels are due) Comments   9/5 JJI 1000 mg Q8 Skin/Soft Tissue AUC 400-600 0.7 140.4 WBC:  Procal:  CRP:           472    9/6           Pk @ 0300  Tr @ 0700                                                                                       ^Target levels depends on dosing and monitoring method, AUC vs. trough based. For AUC based dosing units are mg*h/L. For trough based dosing units are mcg/mL.     *Creatinine clearance is estimated by using the Cockcroft-Gault equation for adult patients and the Brendolyn Patty for pediatric patients.    The decision to discontinue vancomycin therapy will be determined by the primary service.  Please contact the pharmacist with any questions regarding this patient's medication regimen.

## 2022-01-02 NOTE — Care Management Notes (Addendum)
[facility] Management Initial Evaluation    Patient Name: Bryan [name]  Date of Birth: February 05, 1976  Sex: male  Date/Time of Admission: 01/01/2022  6:54 PM  Room/Bed: 559/A  Payor: MEDICARE / Plan: MEDICARE PART A AND B / Product Type: Medicare /   Primary Care Providers:  Pcp, No (General)    Pharmacy Info:   Preferred [place], [place] - [address]    [place] [address]    Phone: [phone] Fax: [phone]    Hours: Not open 24 hours          Emergency Contact Info:   Extended Emergency Contact Information  Primary Emergency Contact: KYLAN, LIBERATI  Mobile Phone: [phone]  Relation: Mother  Preferred language: English  Interpreter needed? No    History:   Bryan [name] is a 46 y.o., male, admitted 01/01/22.    Height/Weight: 180.3 cm (_0 ) / 84.9 kg (187 lb 2.7 oz)     LOS: 1 day   Admitting Diagnosis: Sepsis (CMS [place]) [A41.9]    Assessment:      01/02/22 1149   Assessment Details   Assessment Type Admission   Date of Care Management Update 01/02/22   Readmission   Is this a readmission? Yes   Is this a scheduled readmission? No   Number of days between last admission and this admission? 1   Insurance Information/Type   Insurance type [employer] Concerns none   Living Environment   Select an age group to open "lives with" row.  Adult   Lives With parent(s)   Living Arrangements house   Home Safety   Home Assessment: No Problems Identified   Home Accessibility no concerns   Care Management Plan   Discharge Planning Status initial meeting   Projected Discharge Date 01/04/22   Discharge plan discussed with: Patient   Discharge Needs Assessment   Equipment Currently Used at Home none   Discharge Facility/Level of Care Needs Undetermined at this time   Transportation Available family or friend will provide   Referral Information   Admission Type inpatient   Arrived From home or  self-care     Patient presented to ED on 9/4 with c/o left lower leg swelling. Patient has history of polysubstance abuse. Patient was seen at [facility] on 8/31 and was given oral antibiotics then left AMA. XR showed soft tissue swelling. CT left LE showed soft tissue edema. WBC 21500. Wound care consulted. Cultures pending. IV Vanc and Zosyn ordered. CM met with patient at bedside. Patient lives at home with his mother in a 1 story home. Patient is independent and does not use any DME. Denies home accessibility or home safety concerns. Patient does not drive but friends assist him with transportation when needed. Patient not currently under care of home health agency.     Discharge Plan:  Undetermined at this time  Patient is an independent home plan unless needing IV ABX then will have to stay inpatient for IV ABX course due to drug history.     The patient will continue to be evaluated for developing discharge needs.     Case Manager: Elizabeth Palau, RN

## 2022-01-02 NOTE — Nurses Notes (Signed)
This nurse spoke with Inetta Fermo from wound care clinic regarding consult.

## 2022-01-03 LAB — CBC
HCT: 29 % — ABNORMAL LOW (ref 36.0–46.0)
HGB: 9.5 g/dL — ABNORMAL LOW (ref 13.9–16.3)
MCH: 24.5 pg — ABNORMAL LOW (ref 25.4–34.0)
MCHC: 32.8 g/dL (ref 30.0–37.0)
MCV: 74.6 fL — ABNORMAL LOW (ref 80.0–100.0)
MPV: 6.8 fL — ABNORMAL LOW (ref 7.5–11.5)
PLATELETS: 426 10*3/uL — ABNORMAL HIGH (ref 130–400)
RBC: 3.89 10*6/uL — ABNORMAL LOW (ref 4.30–5.90)
RDW: 18.6 % — ABNORMAL HIGH (ref 11.5–14.0)
WBC: 16.5 10*3/uL — ABNORMAL HIGH (ref 4.5–11.5)

## 2022-01-03 LAB — VANCOMYCIN PEAK: VANCOMYCIN PEAK: 19.4 ug/mL — ABNORMAL LOW (ref 30.0–40.0)

## 2022-01-03 LAB — VANCOMYCIN, TROUGH: VANCOMYCIN TROUGH: 7.2 ug/mL — ABNORMAL LOW (ref 10.0–20.0)

## 2022-01-03 LAB — CREATININE WITH EGFR
CREATININE: 0.52 mg/dL — ABNORMAL LOW (ref 0.66–1.20)
ESTIMATED GFR: >60 mL/min/{1.73_m2} (ref >60)

## 2022-01-03 LAB — LAVENDER TOP TUBE

## 2022-01-03 MED ORDER — PAROXETINE 20 MG TABLET
20.0000 mg | ORAL_TABLET | Freq: Every morning | ORAL | 0 refills | Status: AC
Start: 2022-01-03 — End: 2023-12-23

## 2022-01-03 MED ORDER — TRAZODONE 150 MG TABLET
150.0000 mg | ORAL_TABLET | Freq: Every evening | ORAL | 0 refills | Status: DC
Start: 2022-01-03 — End: 2022-12-12

## 2022-01-03 MED ORDER — SULFAMETHOXAZOLE 800 MG-TRIMETHOPRIM 160 MG TABLET
1.0000 | ORAL_TABLET | Freq: Two times a day (BID) | ORAL | 0 refills | Status: AC
Start: 2022-01-03 — End: 2022-01-17

## 2022-01-03 MED ORDER — QUETIAPINE 100 MG TABLET
ORAL_TABLET | ORAL | 0 refills | Status: AC
Start: 2022-01-03 — End: 2022-02-02

## 2022-01-03 MED ORDER — VANCOMYCIN 10 GRAM INTRAVENOUS SOLUTION
1000.0000 mg | Freq: Three times a day (TID) | INTRAVENOUS | Status: DC
Start: 2022-01-03 — End: 2022-01-03
  Administered 2022-01-03: 1000 mg via INTRAVENOUS
  Administered 2022-01-03: 0 mg via INTRAVENOUS
  Administered 2022-01-04 (×2): 1000 mg via INTRAVENOUS
  Filled 2022-01-03 (×3): qty 10

## 2022-01-03 MED ORDER — SACCHAROMYCES BOULARDII 250 MG CAPSULE
250.0000 mg | ORAL_CAPSULE | Freq: Every day | ORAL | 0 refills | Status: AC
Start: 2022-01-03 — End: 2022-01-17

## 2022-01-03 MED ORDER — IBUPROFEN 200 MG TABLET: 600.0000 mg | ORAL_TABLET | Freq: Three times a day (TID) | ORAL | Status: DC | PRN

## 2022-01-03 NOTE — Pharmacy (Signed)
[facility] / Department of Pharmaceutical Services  Therapeutic Drug Monitoring: Vancomycin  01/03/2022      Patient name: Bryan [name], Bryan [name]  Date of Birth:  Dec 24, 1975    Actual Weight:  Weight: 84.9 kg (187 lb 2.7 oz) (01/02/22 0143)     BMI:  BMI (Calculated): 26.16 (01/02/22 0143)      Date RPh Current regimen (including mg/kg) Indication &  Organism AUC or trough based dosing Target Levels^ SCr (mg/dL) CrCl* (mL/min) Infectious Laboratory Markers (as applicable)   Measured level(s)   (mcg/mL) Calculated AUC (if AUC based monitoring) Plan & predicted AUC/trough if initial dosing (including when levels are due) Comments   9/5 JJI 1000 mg Q8 Skin/Soft Tissue AUC 400-600 0.7 140.4 WBC:  Procal:  CRP:           472    9/6   JJI 1000 mg q8h SSTI AUC 400-600 0.52 189.1  Pk 19.4 @ 0255  Tr 7.2@ 0942 414 No changes  Pk 9/7 @ 1600  Tr 9/7 @ 2000 Tr was drawn 3 hours late today.  Based on Vancopk PK AUC ~ 414 based on levels.  Could increase dose to 1250 mg Q8h, however, will redo labs tomorrow and verify dose.   9/7         Pk @ 1600  Tr @ 2000      9/8               9/9                                                   Arville Go levels depends on dosing and monitoring method, AUC vs. trough based. For AUC based dosing units are mg*h/L. For trough based dosing units are mcg/mL.     *Creatinine clearance is estimated by using the Cockcroft-Gault equation for adult patients and the Brendolyn Patty for pediatric patients.    The decision to discontinue vancomycin therapy will be determined by the primary service.  Please contact the pharmacist with any questions regarding this patient's medication regimen.

## 2022-01-03 NOTE — Care Management Notes (Signed)
[facility] Management Initial Evaluation    Patient Name: Bryan [name]  Date of Birth: 1975/10/27  Sex: male  Date/Time of Admission: 01/01/2022  6:54 PM  Room/Bed: 559/A  Payor: MEDICARE / Plan: MEDICARE PART A AND B / Product Type: Medicare /   Primary Care Providers:  Pcp, No (General)    Pharmacy Info:   Preferred [place], [place] - [address]    [place] [address]    Phone: [phone] Fax: [phone]    Hours: Not open 24 hours          Emergency Contact Info:   Extended Emergency Contact Information  Primary Emergency Contact: Bryan [name]  Mobile Phone: [phone]  Relation: Mother  Preferred language: English  Interpreter needed? No    History:   Bryan [name] is a 46 y.o., male, admitted 01/01/22.    Height/Weight: 180.3 cm (5' 11") / 84.9 kg (187 lb 2.7 oz)     LOS: 1 day   Admitting Diagnosis: Sepsis (CMS [place]) [A41.9]    Assessment:      01/02/22 1149   Assessment Details   Assessment Type Admission   Date of Care Management Update 01/02/22   Readmission   Is this a readmission? Yes   Is this a scheduled readmission? No   Number of days between last admission and this admission? 1   Insurance Information/Type   Insurance type [employer] Concerns none   Living Environment   Select an age group to open "lives with" row.  Adult   Lives With parent(s)   Living Arrangements house   Home Safety   Home Assessment: No Problems Identified   Home Accessibility no concerns   Care Management Plan   Discharge Planning Status initial meeting   Projected Discharge Date 01/04/22   Discharge plan discussed with: Patient   Discharge Needs Assessment   Equipment Currently Used at Home none   Discharge Facility/Level of Care Needs Undetermined at this time   Transportation Available family or friend will provide   Referral Information   Admission Type inpatient   Arrived From home or  self-care     Patient presented to ED on 9/4 with c/o left lower leg swelling. Patient has history of polysubstance abuse. Patient was seen at [facility] on 8/31 and was given oral antibiotics then left AMA. XR showed soft tissue swelling. CT left LE showed soft tissue edema. WBC 21500. Wound care consulted. Cultures pending. IV Vanc and Zosyn ordered. CM met with patient at bedside. Patient lives at home with his mother in a 1 story home. Patient is independent and does not use any DME. Denies home accessibility or home safety concerns. Patient does not drive but friends assist him with transportation when needed. Patient not currently under care of home health agency.     9/6 (1458) Patient is ready for discharge and does not have a ride home. Lucianne Lei set up for transport and will be here to pick up patient in 10 minutes. Bedside RN aware.     Discharge Plan:  Home (Patient/Family Member/other) (code 1)  Patient is an independent home plan.    The patient will continue to be evaluated for developing discharge needs.     Case Manager: Elizabeth Palau, RN

## 2022-01-03 NOTE — Pharmacy (Signed)
[facility] / Department of Pharmaceutical Services  Therapeutic Drug Monitoring: Vancomycin  01/03/2022      Patient name: Bryan [name], Bryan [name]  Date of Birth:  April 11, 1976    Actual Weight:  Weight: 84.9 kg (187 lb 2.7 oz) (01/02/22 0143)     BMI:  BMI (Calculated): 26.16 (01/02/22 0143)      Date RPh Current regimen (including mg/kg) Indication &  Organism AUC or trough based dosing Target Levels^ SCr (mg/dL) CrCl* (mL/min) Infectious Laboratory Markers (as applicable)   Measured level(s)   (mcg/mL) Calculated AUC (if AUC based monitoring) Plan & predicted AUC/trough if initial dosing (including when levels are due) Comments   9/5 JJI 1000 mg Q8 Skin/Soft Tissue AUC 400-600 0.7 140.4 WBC:  Procal:  CRP:           472    9/6   JJI     0.52 189.1  Pk 19.4 @ 0255  Tr @ 0700                                                                                       ^Target levels depends on dosing and monitoring method, AUC vs. trough based. For AUC based dosing units are mg*h/L. For trough based dosing units are mcg/mL.     *Creatinine clearance is estimated by using the Cockcroft-Gault equation for adult patients and the Brendolyn Patty for pediatric patients.    The decision to discontinue vancomycin therapy will be determined by the primary service.  Please contact the pharmacist with any questions regarding this patient's medication regimen.

## 2022-01-03 NOTE — Care Plan (Signed)
Problem: Opioid Dependence or Withdrawal  Goal: Withdrawal Symptoms Managed  Outcome: Adequate for Discharge     Problem: Skin Injury Risk Increased  Goal: Skin Health and Integrity  Outcome: Adequate for Discharge     Problem: Depression  Goal: Improved Mood  Outcome: Adequate for Discharge     Problem: Health Knowledge, Opportunity to Enhance (Adult,Obstetrics,Pediatric)  Goal: Knowledgeable about Health Subject/Topic  Description: Patient will demonstrate the desired outcomes by discharge/transition of care.  Outcome: Adequate for Discharge

## 2022-01-03 NOTE — Discharge Summary (Signed)
[facility]  DISCHARGE SUMMARY    PATIENT NAME:  Bryan [name], Bryan [name]  MRN:  [id number]  DOB:  06-24-1975    ENCOUNTER DATE:  01/01/2022  INPATIENT ADMISSION DATE: 01/01/2022  DISCHARGE DATE:  01/03/2022    ATTENDING PHYSICIAN: No att. providers found  SERVICE: [facility] HOSPITALIST  PRIMARY CARE PHYSICIAN: No Pcp       No lay caregiver identified.    PRIMARY DISCHARGE DIAGNOSIS: Cellulitis of left lower extremity  Active Hospital Problems    Diagnosis Date Noted    Principal Problem: Cellulitis of left lower extremity [L03.116] 12/31/2021    Sepsis (CMS HCC) [A41.9] 01/01/2022      Resolved Hospital Problems   No resolved problems to display.     Active Non-Hospital Problems    Diagnosis Date Noted    COPD (chronic obstructive pulmonary disease) (CMS HCC) 12/31/2021    Depression 12/31/2021    Abscess of left leg 12/31/2021    Tobacco use disorder 07/28/2021    Osteomyelitis (CMS HCC) 07/27/2021           Current Discharge Medication List        START taking these medications.        Details   Ibuprofen 200 mg Tablet  Commonly known as: MOTRIN   Take 3 Tablets (600 mg total) by mouth Three times a day as needed for Pain  Refills: 0            CONTINUE these medications which have CHANGED during your visit.        Details   sulfamethoxazole-trimethoprim 800-160 mg tablet  Commonly known as: BACTRIM DS  What changed: when to take this   Take 1 Tablet (160 mg total) by mouth Twice daily for 14 days  Qty: 28 Tablet  Refills: 0            CONTINUE these medications - NO CHANGES were made during your visit.        Details   acetaminophen 325 mg Tablet  Commonly known as: TYLENOL   Take 1 Tablet (325 mg total) by mouth Every 4 hours as needed for Pain  Refills: 0     diphenhydrAMINE 50 mg Capsule  Commonly known as: BENADRYL   Take 1 Capsule (50 mg total) by mouth Every night as needed  Refills: 0     mupirocin 2 % Ointment  Commonly known as: BACTROBAN   Apply topically Three times a day for 10 days Apply topically to left lower  leg ulcers.  Qty: 22 g  Refills: 0     PARoxetine 20 mg Tablet  Commonly known as: PAXIL   Take 1 Tablet (20 mg total) by mouth Every morning  Refills: 0     * QUEtiapine 200 mg Tablet  Commonly known as: SEROQUEL   Take 1 Tablet (200 mg total) by mouth Every night  Refills: 0     * QUEtiapine 100 mg Tablet  Commonly known as: SEROQUEL   Take 1 Tablet (100 mg total) by mouth Once a day Takes, 100 mg in am and 200 mg HS  Refills: 0     Saccharomyces boulardii 250 mg Capsule  Commonly known as: FLORASTOR   Take 1 Capsule (250 mg total) by mouth Once a day for 14 days  Qty: 14 Capsule  Refills: 0     traZODone 150 mg Tablet  Commonly known as: DESYREL   1 Tablet (150 mg total) Every night  Refills: 0           *  This list has 2 medication(s) that are the same as other medications prescribed for you. Read the directions carefully, and ask your doctor or other care provider to review them with you.                STOP taking these medications.      cephalexin 500 mg Capsule  Commonly known as: KEFLEX     HYDROcodone-acetaminophen 5-325 mg Tablet  Commonly known as: NORCO     nicotine 14 mg/24 hr Patch 24 hr  Commonly known as: NICODERM CQ            Discharge med list refreshed?  YES     Allergies   Allergen Reactions    Latex Rash    Adhesive Rash    Other Rash     Petroleum products    Tramadol Rash    Meloxicam     Vistaril [Hydroxyzine Hcl] Swelling     HOSPITAL PROCEDURE(S):   No orders of the defined types were placed in this encounter.      REASON FOR HOSPITALIZATION AND HOSPITAL COURSE   BRIEF HPI:  This is a 46 y.o., male admitted for   Worsening cellulitis of the left  lower extremity.  BRIEF HOSPITAL NARRATIVE:      Patient was found to be septic from left lower extremity cellulitis due to MRSA, wound cultures with positive on 08/31.  Day erythema and edema improved markedly with IV vancomycin, blood cultures were negative.  DVT was ruled out.  Patient's pain was well controlled.  Patient insisted on being  discharged.  Patient was discharged to home on Bactrim based on wound culture results.    Patient was also provided  with refills  on his chronic home medications per his request.  TRANSITION/POST DISCHARGE CARE/PENDING TESTS/REFERRALS:   As above    CONDITION ON DISCHARGE:  A. Ambulation: Full ambulation  B. Self-care Ability: Complete  C. Cognitive Status Alert and Oriented x 3  D. Code status at discharge: full code     PHYSICAL EXAM ON DISCHARGE :  GEN: AAOx3, NAD  HEART: RRR,  edema   LUNGS: CTAB, non-labored breathing   ABDOMEN: NABS, no mass, no tenderness   NEURO: no motor deficits    MSK: left lower extremity with edema and erythema extending to lower leg , some unroofed blisters around the ankle, normal warmth and minimal pain .         LINES/DRAINS/WOUNDS AT DISCHARGE:   Patient Lines/Drains/Airways Status       Active Line / Dialysis Catheter / Dialysis Graft / Drain / Airway / Wound       None                    DISCHARGE DISPOSITION:  Home discharge  DISCHARGE INSTRUCTIONS:  Post-Discharge Follow Up Appointments       Follow up with Pcp, No in 3 day(s)             DISCHARGE INSTRUCTION - ACTIVITY     Activity: AS TOLERATED      DISCHARGE INSTRUCTION - RESUME HOME DIET     Diet: RESUME HOME DIET       Time spent on discharge was 35 minutes      Bryan Browns, MD    Copies sent to Care Team         Relationship Specialty Notifications Start End    Pcp, No PCP - General   06/07/20  Referring providers can utilize [email or web address] to access their referred [facility] patient's information.

## 2022-01-03 NOTE — Wound Therapy (Signed)
Wound care consult rec'd. Saw pt. He has a wound near the medial posterior knee. There is scabs and dried drainage. His leg is edematous and still red. I think he should continue with the prescribed mupirocin and needs washed with soap and water bid.

## 2022-01-03 NOTE — Nurses Notes (Signed)
Pt verbalizes discharge instructions. Pt's IV removed and dressing in place. Pt left unit with all personal belongings.

## 2022-01-04 LAB — ADULT ROUTINE BLOOD CULTURE, SET OF 2 BOTTLES (BACTERIA AND YEAST)
BLOOD CULTURE, ROUTINE: NO GROWTH
BLOOD CULTURE, ROUTINE: NO GROWTH

## 2022-01-05 ENCOUNTER — Telehealth (HOSPITAL_COMMUNITY): Payer: Self-pay | Admitting: Nurse Practitioner

## 2022-01-06 LAB — ADULT ROUTINE BLOOD CULTURE, SET OF 2 BOTTLES (BACTERIA AND YEAST)
BLOOD CULTURE, ROUTINE: NO GROWTH
BLOOD CULTURE, ROUTINE: NO GROWTH

## 2022-01-11 ENCOUNTER — Ambulatory Visit (HOSPITAL_COMMUNITY): Payer: Medicare Other | Admitting: Nurse Practitioner

## 2022-05-29 ENCOUNTER — Other Ambulatory Visit: Payer: Self-pay

## 2022-05-29 ENCOUNTER — Emergency Department
Admission: EM | Admit: 2022-05-29 | Discharge: 2022-05-29 | Discharge status: Against Medical Advice | Payer: Medicare Other

## 2022-05-29 DIAGNOSIS — Z5321 Procedure and treatment not carried out due to patient leaving prior to being seen by health care provider: Secondary | ICD-10-CM | POA: Insufficient documentation
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
## 2022-06-01 ENCOUNTER — Emergency Department
Admission: EM | Admit: 2022-06-01 | Discharge: 2022-06-01 | Disposition: A | Discharge status: Home / Self Care | Payer: Medicare Other | Attending: Student in an Organized Health Care Education/Training Program | Admitting: Student in an Organized Health Care Education/Training Program

## 2022-06-01 ENCOUNTER — Encounter (HOSPITAL_COMMUNITY): Payer: Self-pay

## 2022-06-01 ENCOUNTER — Other Ambulatory Visit: Payer: Self-pay

## 2022-06-01 DIAGNOSIS — F17290 Nicotine dependence, other tobacco product, uncomplicated: Secondary | ICD-10-CM | POA: Insufficient documentation

## 2022-06-01 DIAGNOSIS — L0201 Cutaneous abscess of face: Secondary | ICD-10-CM | POA: Insufficient documentation

## 2022-06-01 DIAGNOSIS — Z8614 Personal history of Methicillin resistant Staphylococcus aureus infection: Secondary | ICD-10-CM | POA: Insufficient documentation

## 2022-06-01 DIAGNOSIS — F17210 Nicotine dependence, cigarettes, uncomplicated: Secondary | ICD-10-CM | POA: Insufficient documentation

## 2022-06-01 MED ORDER — CLINDAMYCIN HCL 150 MG CAPSULE
450.0000 mg | ORAL_CAPSULE | Freq: Four times a day (QID) | ORAL | 0 refills | Status: AC
Start: 2022-06-01 — End: 2022-06-11

## 2022-06-01 MED ORDER — MUPIROCIN 2 % TOPICAL OINTMENT
TOPICAL_OINTMENT | Freq: Three times a day (TID) | CUTANEOUS | 0 refills | Status: AC
Start: 2022-06-01 — End: 2022-06-08

## 2022-06-01 MED ORDER — CLINDAMYCIN HCL 150 MG CAPSULE
450.0000 mg | ORAL_CAPSULE | ORAL | Status: AC
Start: 2022-06-01 — End: 2022-06-01
  Administered 2022-06-01: 450 mg via ORAL
  Filled 2022-06-01: qty 3

## 2022-06-01 NOTE — Procedures (Signed)
I&D    Date/Time: 06/01/2022 2:35 PM    Performed by: Dionne Ano, DO  Authorized by: Dionne Ano, DO    Consent:     Consent obtained:  Verbal    Consent given by:  Patient  Universal protocol:     Patient identity confirmed:  Verbally with patient  Location:     Type:  Abscess    Size:  4 cm    Location:  Head    Head location:  Face  Pre-procedure details:     Skin preparation:  Povidone-iodine  Sedation:     Sedation type:  None  Anesthesia:     Anesthesia method:  Local infiltration    Local anesthetic:  Lidocaine 1% w/o epi  Procedure type:     Complexity:  Simple  Procedure details:     Ultrasound guidance: no      Needle aspiration: no      Incision types:  Stab incision    Incision depth:  Subcutaneous    Drainage:  Purulent and bloody    Drainage amount:  Moderate    Wound treatment:  Wound left open    Packing materials:  None  Post-procedure details:     Procedure completion:  Tolerated well, no immediate complications      Dionne Ano, DO   2/2/202414:35
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
## 2022-06-01 NOTE — ED Nurses Note (Signed)
Patient discharged home with friend.  AVS reviewed with patient/care giver.  A written copy of the AVS and discharge instructions was given to the patient/care giver.  Questions sufficiently answered as needed.  Patient/care giver encouraged to follow up with PCP as indicated.  In the event of an emergency, patient/care giver instructed to call 911 or go to the nearest emergency room.

## 2022-06-01 NOTE — ED Nurses Note (Signed)
Red raised area noted to middle of forehead  approx diameter of gulf ball

## 2022-06-01 NOTE — ED Nurses Note (Signed)
Pt in room 11  dr Darrick Meigs in to see pt    wound culture obtained at this time

## 2022-06-08 LAB — WOUND, SUPERFICIAL/NON-STERILE SITE, AEROBIC CULTURE AND GRAM STAIN

## 2022-10-24 ENCOUNTER — Ambulatory Visit (INDEPENDENT_AMBULATORY_CARE_PROVIDER_SITE_OTHER): Payer: Self-pay | Admitting: Foot & Ankle Surgery

## 2022-10-24 ENCOUNTER — Ambulatory Visit: Payer: Medicare Other | Attending: Foot & Ankle Surgery | Admitting: Foot & Ankle Surgery

## 2022-10-24 ENCOUNTER — Encounter (INDEPENDENT_AMBULATORY_CARE_PROVIDER_SITE_OTHER): Payer: Self-pay | Admitting: Foot & Ankle Surgery

## 2022-10-24 ENCOUNTER — Other Ambulatory Visit: Payer: Self-pay

## 2022-10-24 DIAGNOSIS — M19071 Primary osteoarthritis, right ankle and foot: Secondary | ICD-10-CM | POA: Insufficient documentation

## 2022-10-24 DIAGNOSIS — M79604 Pain in right leg: Secondary | ICD-10-CM | POA: Insufficient documentation

## 2022-10-24 NOTE — Progress Notes (Addendum)
W[facility], [facility] [address]  [place] [address]  Operated by [facility]          Name: Demarquise Weatherley MRN:  [id number]   Date: 10/24/2022 Age: 47 y.o.         Chief Complaint:   Chief Complaint   Patient presents with    Ankle Pain     PT is here for R ankle pain         Subjective  Nickali Reul is a 47 y.o. male who presents to clinic for follow-up of ankle arthritis right.  He says that he has been working on some range-of-motion exercises for the right ankle he says he can move it up and down problems moving it side-to-side.  He relates some pain in the ankle pain in his right leg in general he is requesting therapy.  He says he did not get the ankle brace in his requesting order for his ankle brace      Objective    On physical examination Kirkpatrick Monclova is seated comfortably in the examination room in no apparent distress.  Alert and oriented to time and place. Mood and affect are normal and appropriate to situation.  He  appears well developed, well nourished, with good attention to hygiene and body habitus.    Skin:  Normal skin texture and turgor right foot and ankle erythema no ecchymosis  Musculoskeletal:  Passive motion of the right ankle limited in dorsiflexion.  5/5 muscle strength for all groups tested right foot and ankle.  No instability in the frontal, coronal, sagittal plane right rearfoot or ankle  Neurological:  Gross sensation intact right lower extremity  Vascular:  Pedal pulses intact right.  Mild to moderate edema right ankle rearfoot      Assessment/ Plan  Raymen was seen today for ankle pain.    Diagnoses and all orders for this visit:    Arthritis of right ankle  -     Refer to Physical Therapy-External; Future    Pain of right lower extremity  -     Refer to Physical Therapy-External; Future        1. Today I explained the etiology, prognosis, and treatment options for right ankle arthritis.    2. I discussed treatment options including  custom brace, physical therapy, NSAIDs, steroid injection.  Surgery, arthrodesis of the ankle be an option as well.    3. Today I recommended ankle brace.  Order written for an [place] brace.  Walking boot previously prescribed to the patient is not sufficient to offload this chronic condition for this patient.  Walking boot is not durable, is cumbersome, is orthopedically inappropriate for long-term use with regards to not only the foot and ankle condition but other joints proximal to his arthritic ankle joint knee hip back.  Order written for physical therapy.  Inquiring about medication for arthritis.  He is allergic to Mobic he is allergic to NSAIDs.  Difficult to provide this to him.  No Narcotics for this chronic condition.  Consider referral to pain management.    4. Follow up as needed.      Wyn Quaker, DPM        This note was partially created using M*Modal fluency direct system (voice recognition software ) and is inherently subject to errors including those of syntax and "sound- alike" substitutions which may escape proofreading.  In such instances, , original meaning may be extrapolated by  contextual derivation.

## 2022-11-05 ENCOUNTER — Other Ambulatory Visit: Payer: Self-pay

## 2022-11-05 ENCOUNTER — Ambulatory Visit: Payer: Medicare Other | Attending: PHYSICIAN ASSISTANT

## 2022-11-05 ENCOUNTER — Encounter (INDEPENDENT_AMBULATORY_CARE_PROVIDER_SITE_OTHER): Payer: Self-pay | Admitting: Neurological Surgery

## 2022-11-05 ENCOUNTER — Ambulatory Visit (INDEPENDENT_AMBULATORY_CARE_PROVIDER_SITE_OTHER): Payer: Medicare Other | Admitting: PHYSICIAN ASSISTANT

## 2022-11-05 ENCOUNTER — Ambulatory Visit (HOSPITAL_COMMUNITY)
Admission: RE | Admit: 2022-11-05 | Discharge: 2022-11-05 | Disposition: A | Discharge status: Home / Self Care | Payer: Medicare Other | Source: Ambulatory Visit | Attending: PHYSICIAN ASSISTANT | Admitting: PHYSICIAN ASSISTANT

## 2022-11-05 VITALS — BP 118/70 | HR 72 | Temp 98.4°F | Ht 70.24 in | Wt 209.0 lb

## 2022-11-05 DIAGNOSIS — M542 Cervicalgia: Secondary | ICD-10-CM | POA: Insufficient documentation

## 2022-11-05 DIAGNOSIS — M545 Low back pain, unspecified: Secondary | ICD-10-CM | POA: Insufficient documentation

## 2022-11-05 NOTE — Progress Notes (Signed)
[place] Department of Neurosurgery  [facility]  Outpatient     Bryan [name]  Date of Service: 11/05/2022  Referring physician: Self, Referral  No address on file    Gender: male      Chief Complaint:   Chief Complaint   Patient presents with    Lower Back Pain     Wants PT referral.  Upper back pain 3 months  as well       History is provided by patient    History of Present Illness:  This is a 47 year old male presenting for follow-up of low back pain and neck pain that radiates into his shoulders.  Patient has history of bilateral L3-S1 pedicle screw fixation/fusion performed at [facility] in April 19, 2019 by Dr. 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  Patient was last seen in February 2022.  He did have x-rays performed at that visit that showed nondisplaced fracture through the right S1 screw.  He was also dealing with right foot osteomyelitis for which he is following with podiatry for.  Patient states he has been clean for almost 3 months and is currently in counseling.  He reports low back pain and neck pain radiating into the bilateral shoulder blades.  He states this began a couple months ago.  He denies fevers chills, nausea/vomiting, headaches, recent illness, and history of cervical spine surgery.  He is interested in going to physical therapy for his symptoms.  He has no recent testing of his neck or back.          Past History:  Current Outpatient Medications   Medication Sig    acetaminophen (TYLENOL) 325 mg Oral Tablet Take 1 Tablet (325 mg total) by mouth Every 4 hours as needed for Pain    diphenhydrAMINE (BENADRYL) 50 mg Oral Capsule Take 1 Capsule (50 mg total) by mouth Every night as needed    Ibuprofen (MOTRIN) 200 mg Oral Tablet Take 3 Tablets (600 mg total) by mouth Three times a day as needed for Pain    OLANZapine (ZYPREXA) 15 mg Oral Tablet Take 1 Tablet (15 mg total) by mouth    PARoxetine (PAXIL) 20 mg Oral Tablet Take 1 Tablet (20 mg total) by  mouth Every morning for 30 days    QUEtiapine (SEROQUEL) 200 mg Oral Tablet Take 1 Tablet (200 mg total) by mouth Every night (Patient not taking: Reported on 10/24/2022)    traZODone (DESYREL) 150 mg Oral Tablet Take 1 Tablet (150 mg total) by mouth Every night for 30 days     Allergies   Allergen Reactions    Latex Rash    Adhesive Rash    Other Rash     Petroleum products    Tramadol Rash    Meloxicam     Naloxone     Vistaril [Hydroxyzine Hcl] Swelling     Past Medical History:   Diagnosis Date    Anxiety     Back pain     COPD (chronic obstructive pulmonary disease) (CMS HCC)     Depression     Osteochondrosis     right foot         Past Surgical History:   Procedure Laterality Date    ANKLE SURGERY      ANKLE SURGERY Right     cleaned out arthritis    HX HERNIA REPAIR      HX LUMBAR SPINE SURGERY      HX ROTATOR  CUFF REPAIR Right            Family History:  Family Medical History:       Problem Relation (Age of Onset)    Cancer Father              Social History:  Social History     Socioeconomic History    Marital status: Divorced   Tobacco Use    Smoking status: Every Day     Current packs/day: 1.00     Average packs/day: 1 pack/day for 30.0 years (30.0 ttl pk-yrs)     Types: Cigarettes    Smokeless tobacco: Never   Vaping Use    Vaping status: Some Days    Substances: THC   Substance and Sexual Activity    Alcohol use: Never    Drug use: Yes     Frequency: 7.0 times per week     Types: Marijuana, Opioid    Sexual activity: Yes     Partners: Female     Social Determinants of Health     Financial Resource Strain: High Risk (01/02/2022)    Financial Resource Strain     SDOH Financial: Yes, multiple.   Transportation Needs: Low Risk  (01/02/2022)    Transportation Needs     SDOH Transportation: No   Social Connections: Low Risk  (01/02/2022)    Social Connections     SDOH Social Isolation: 5 or more times a week    Intimate Partner Violence   Housing Stability: High Risk (01/02/2022)    Housing Stability     SDOH  Housing Situation: I do not have housing (staying with others, in a hotel, in a shelter, living on the street, on a beach, in a car, or in a park).       Review of Systems:  Other than ROS in the HPI, all other systems were negative.    Examination:  BP 118/70   Pulse 72   Temp 36.9 C (98.4 F) (Tympanic)   Ht 1.784 m (5' 10.24")   Wt 94.8 kg (209 lb)   SpO2 98%   BMI 29.79 kg/m         Constitutional:   General appearance: Normal  Integumentary: Skin is warm, pink. No rashes or lesions.   Cardiovascular:   RRR.   Peripheral pulses 2+ bilaterally. No peripheral edema noted.  Lungs:CTA bilaterally. Respiration non-labored.   Musculoskeletal:   Palpation: No tenderness noted of cervical neck or vertebral processes.   Muscle tone (upper extremities): within normal limits   Muscle tone (lower extremities): within normal limits   Arm Right Left Leg Right Left   Deltoid (shoulder abduction) 5/5 5/5 Hip Flexion 5/5 5/5   Elbow flexors 5/5 5/5 Knee Flexion 5/5 5/5   Elbow extensors 5/5 5/5 Knee Extension 5/5 5/5   Grip 5/5 5/5 Foot Dorsiflexion 5/5 5/5         Foot Plantarflexion 5/5 5/5   Gait: Posture is normal. Casual gait is steady and intact without ataxia.  Neurological:   Orientation: Alert and oriented x3.  Recent and remote memory: Good recall and able to follow commands.  Attention span and concentration: Normal in conversation.  Speech: No aphasia or dysarthria.  Fund of knowledge: Consistent with knowledge base in this setting.   Motor: Appropriate muscle bulk and tone bilaterally in upper and lower extremities.   Coordination: Rapid alternating movements of upper extremities intact bilaterally.  No pronator drift of outstretched arms.   Sensory:  Intact to light touch in all four extremities.   Reflexes:     Brachio-  radialis Biceps Triceps Patellar   Right 2+ 2+ 2+ 1+   Left 2+ 2+ 2+ 1+       Data reviewed:  N/A    Assessment/Plan:      ICD-10-CM    1. Low back pain  M54.50 External Referral to  Physical Therapy     XR LUMBAR SPINE AP AND LAT     XR CERVICAL SPINE AP AND LATERAL      2. Neck pain  M54.2 External Referral to Physical Therapy     XR LUMBAR SPINE AP AND LAT     XR CERVICAL SPINE AP AND LATERAL          Imaging reviewed with patient in room.     Slip provided for PT  Will have patient obtain lumbar and cervical xrays   F/u in 4-6 weeks with xrays or sooner if needed    All patient question's were answered and they verbalized agreement with plan of care.   Patient instructed to call/return to office or present to ED if symptoms change or worsen.     The patient was seen independently.    Rachel Moulds, PA-C 11/05/2022, 15:05

## 2022-11-20 ENCOUNTER — Telehealth (INDEPENDENT_AMBULATORY_CARE_PROVIDER_SITE_OTHER): Payer: Self-pay | Admitting: Foot & Ankle Surgery

## 2022-11-20 NOTE — Telephone Encounter (Signed)
Fax came from [place] [facility] stating this patient needs an Addendum to the notes from 10/24/2022 explaining why the walking boot the patient previously received is not sufficient for the pt's current condition and why the AFO Rx'd is necessary for the pt.     Sending message to Dr.Fijalkowksi.

## 2022-11-22 ENCOUNTER — Encounter (HOSPITAL_BASED_OUTPATIENT_CLINIC_OR_DEPARTMENT_OTHER): Payer: Self-pay | Admitting: Physician Assistant

## 2022-11-22 ENCOUNTER — Telehealth (INDEPENDENT_AMBULATORY_CARE_PROVIDER_SITE_OTHER): Payer: Self-pay | Admitting: Foot & Ankle Surgery

## 2022-11-22 NOTE — Telephone Encounter (Signed)
Elizur asking for you to addend your note from, 10/24/22  explaining why the walking boot previously received is not sufficient for the pt's current condition and why the AFO prescribed is necessary for this pt.     Sending to Dr. Gildardo Cranker. [place], [place]  11/22/2022 11:45

## 2022-12-12 ENCOUNTER — Other Ambulatory Visit: Payer: Self-pay

## 2022-12-12 ENCOUNTER — Ambulatory Visit: Payer: Medicare Other | Attending: Physician Assistant | Admitting: Physician Assistant

## 2022-12-12 ENCOUNTER — Other Ambulatory Visit (HOSPITAL_BASED_OUTPATIENT_CLINIC_OR_DEPARTMENT_OTHER): Payer: Medicare Other

## 2022-12-12 ENCOUNTER — Encounter (HOSPITAL_BASED_OUTPATIENT_CLINIC_OR_DEPARTMENT_OTHER): Payer: Self-pay | Admitting: Physician Assistant

## 2022-12-12 VITALS — BP 110/70 | HR 55 | Ht 70.0 in | Wt 209.2 lb

## 2022-12-12 DIAGNOSIS — B1920 Unspecified viral hepatitis C without hepatic coma: Secondary | ICD-10-CM | POA: Insufficient documentation

## 2022-12-12 LAB — CBC WITH DIFF
BASOPHIL #: 0.12 10*3/uL (ref <=0.20)
BASOPHIL %: 1.4 %
EOSINOPHIL #: 0.67 10*3/uL — ABNORMAL HIGH (ref <=0.50)
EOSINOPHIL %: 7.9 %
HCT: 42.2 % (ref 38.9–52.0)
HGB: 13.8 g/dL (ref 13.4–17.5)
IMMATURE GRANULOCYTE #: <0.1 10*3/uL (ref <0.10)
IMMATURE GRANULOCYTE %: 0.2 % (ref 0.0–1.0)
LYMPHOCYTE #: 3.01 10*3/uL (ref 1.00–4.80)
LYMPHOCYTE %: 35.7 %
MCH: 27.2 pg (ref 26.0–32.0)
MCHC: 32.7 g/dL (ref 31.0–35.5)
MCV: 83.2 fL (ref 78.0–100.0)
MONOCYTE #: 0.49 10*3/uL (ref 0.20–1.10)
MONOCYTE %: 5.8 %
MPV: 9.1 fL (ref 8.7–12.5)
NEUTROPHIL #: 4.12 10*3/uL (ref 1.50–7.70)
NEUTROPHIL %: 49 %
PLATELETS: 306 10*3/uL (ref 150–400)
RBC: 5.07 10*6/uL (ref 4.50–6.10)
RDW-CV: 16.1 % — ABNORMAL HIGH (ref 11.5–15.5)
WBC: 8.4 10*3/uL (ref 3.7–11.0)

## 2022-12-12 LAB — COMPREHENSIVE METABOLIC PANEL, NON-FASTING
ALBUMIN: 3.7 g/dL (ref 3.5–5.0)
ALKALINE PHOSPHATASE: 102 U/L (ref 45–115)
ALT (SGPT): 83 U/L — ABNORMAL HIGH (ref 10–55)
ANION GAP: 5 mmol/L (ref 4–13)
AST (SGOT): 40 U/L (ref 8–45)
BILIRUBIN TOTAL: 0.2 mg/dL — ABNORMAL LOW (ref 0.3–1.3)
BUN/CREA RATIO: 18 (ref 6–22)
BUN: 15 mg/dL (ref 8–25)
CALCIUM: 9.5 mg/dL (ref 8.6–10.2)
CHLORIDE: 105 mmol/L (ref 96–111)
CO2 TOTAL: 29 mmol/L (ref 22–30)
CREATININE: 0.83 mg/dL (ref 0.75–1.35)
ESTIMATED GFR - MALE: >90 mL/min/BSA (ref >=60)
GLUCOSE: 98 mg/dL (ref 65–125)
POTASSIUM: 4 mmol/L (ref 3.5–5.1)
PROTEIN TOTAL: 7.5 g/dL (ref 6.4–8.3)
SODIUM: 139 mmol/L (ref 136–145)

## 2022-12-12 LAB — IRON TRANSFERRIN AND TIBC
IRON (TRANSFERRIN) SATURATION: 18 % (ref 15–50)
IRON: 76 ug/dL (ref 55–175)
TOTAL IRON BINDING CAPACITY: 428 ug/dL (ref 252–504)
TRANSFERRIN: 306 mg/dL (ref 180–360)

## 2022-12-12 LAB — HIV1/HIV2 SCREEN, COMBINED ANTIGEN AND ANTIBODY: HIV SCREEN, COMBINED ANTIGEN & ANTIBODY: NEGATIVE

## 2022-12-12 LAB — FERRITIN: FERRITIN: 41 ng/mL (ref 20–300)

## 2022-12-12 LAB — PT/INR
INR: 1.02 (ref <=5.00)
PROTHROMBIN TIME: 12 seconds (ref 9.7–13.6)

## 2022-12-12 LAB — HEPATITIS A (HAV) IGG ANTIBODY: HAV IGG ANTIBODY: NEGATIVE

## 2022-12-12 LAB — HEPATITIS B SURFACE ANTIBODY: HBV SURFACE ANTIBODY QUANTITATIVE: 0.16 m[IU]/mL (ref <8.00)

## 2022-12-12 NOTE — H&P (Signed)
[facility] MED[facility] OFFICE [facility]  [address]  [place] [address]  [phone]    History and Physical  Name: Bryan [name]  MRN: [id number]  DOB: 09-May-1975  Age: 47 y.o.  Date: 12/12/2022  Referring provider: No ref. provider found    Chief complaint:   New Patient (Viral hepatitis C).    HPI:  The patient is a 47 y.o. old male who came in today for hepatitis C  Hx of IVDU  States he has been clean for 114 days  Does not drink alcohol  Wants to start tx for Hepatitis C  No GI complaints at this time    ROS:      General Negative for: Appetite Loss, Weight loss, Fatigue out of ordinary     ENT Negative for: Nose bleeds, Mouth sores, Sore throat     Respiratory Negative for: Coughing up blood, Wheezing, Nocturnal cough     Cardiovascular Negative for: Chest Pain, Leg edema, Indigestion     Gastronintestinal Negative for: Vomiting, Nausea, Heartburn, Difficulty swallowing, Change in bowl habits, Black/Tarry stools, Abdominal pain     Genitourinary Negative for: Pelvic Pain, Urinary frequency  All other review of systems negative.       Past medical history:  Past Medical History:   Diagnosis Date    Anxiety     Back pain     COPD (chronic obstructive pulmonary disease) (CMS HCC)     Depression     Osteochondrosis     right foot         Past surgical history:  Past Surgical History:   Procedure Laterality Date    ANKLE SURGERY      ANKLE SURGERY Right     cleaned out arthritis    HX HERNIA REPAIR      HX LUMBAR SPINE SURGERY      HX ROTATOR CUFF REPAIR Right          Medications:  Current Outpatient Medications   Medication Sig    diphenhydrAMINE (BENADRYL) 50 mg Oral Capsule Take 1 Capsule (50 mg total) by mouth Every night as needed    Ibuprofen (MOTRIN) 200 mg Oral Tablet Take 3 Tablets (600 mg total) by mouth Three times a day as needed for Pain    OLANZapine (ZYPREXA) 15 mg Oral Tablet Take 20 mg by mouth    PARoxetine (PAXIL) 20 mg Oral Tablet Take 1 Tablet (20 mg total) by mouth Every  morning for 30 days    tamsulosin (FLOMAX) 0.4 mg Oral Capsule Take 1 Capsule (0.4 mg total) by mouth Every evening after dinner     Allergies:  Allergies   Allergen Reactions    Latex Rash    Adhesive Rash    Other Rash     Petroleum products    Tramadol Rash    Meloxicam     Naloxone     Vistaril [Hydroxyzine Hcl] Swelling     Family history:  Family Medical History:       Problem Relation (Age of Onset)    Cancer Father            Social history:  Social History     Socioeconomic History    Marital status: Divorced   Tobacco Use    Smoking status: Every Day     Current packs/day: 1.00     Average packs/day: 1 pack/day for 30.0 years (30.0 ttl pk-yrs)     Types: Cigarettes    Smokeless tobacco: Never  Vaping Use    Vaping status: Some Days    Substances: THC   Substance and Sexual Activity    Alcohol use: Never    Drug use: Yes     Frequency: 7.0 times per week     Types: Marijuana, Opioid    Sexual activity: Yes     Partners: Female     Social Determinants of Health     Financial Resource Strain: High Risk (01/02/2022)    Financial Resource Strain     SDOH Financial: Yes, multiple.   Transportation Needs: Low Risk  (01/02/2022)    Transportation Needs     SDOH Transportation: No   Social Connections: Low Risk  (01/02/2022)    Social Connections     SDOH Social Isolation: 5 or more times a week    Intimate Partner Violence   Housing Stability: High Risk (01/02/2022)    Housing Stability     SDOH Housing Situation: I do not have housing (staying with others, in a hotel, in a shelter, living on the street, on a beach, in a car, or in a park).       Vitals:    12/12/22 1031   BP: 110/70   Pulse: 55   SpO2: 96%   Weight: 94.9 kg (209 lb 3.5 oz)   Height: 1.778 m (5\' 10" )   BMI: 30.08        Physical Examination:  Physical Exam  Vitals reviewed.   Constitutional:       General: He is not in acute distress.     Appearance: Normal appearance.   HENT:      Head: Normocephalic.   Eyes:      Extraocular Movements: Extraocular  movements intact.   Pulmonary:      Effort: Pulmonary effort is normal. No respiratory distress.   Neurological:      Mental Status: He is alert and oriented to person, place, and time.   Psychiatric:         Behavior: Behavior normal.         Thought Content: Thought content normal.         Judgment: Judgment normal.          No results were found from the past 30 days.  Results for orders placed or performed in visit on 12/12/22 (from the past 1008 hour(s))   CBC/DIFF    Narrative    The following orders were created for panel order CBC/DIFF.  Procedure                               Abnormality         Status                     ---------                               -----------         ------                     CBC WITH VOZD[[id number]]                                    In process  Please view results for these tests on the individual orders.         Visit Diagnosis    ICD-10-CM    1. Hepatitis C  B19.20 CBC/DIFF     COMPREHENSIVE METABOLIC PANEL, NON-FASTING     HEPATITIS C VIRUS (HCV) RNA DETECTION AND QUANTIFICATION, PCR, PLASMA     HEPATITIS C VIRUS (HCV) GENOTYPE, PLASMA     PT/INR     FERRITIN     IRON TRANSFERRIN AND TIBC     HEPATITIS A (HAV) IGG ANTIBODY     HEPATITIS B SURFACE ANTIBODY     LIVER FIBROSIS, FIBROTEST PNL     HIV1/HIV2 SCREEN, COMBINED ANTIGEN AND ANTIBODY        Check labs per orders  If all ok, will send Epclusa to [facility] specialty pharmacy  Discussed that he will need labs q4weeks while on tx  F/u in 3 months    Cloria Spring, PA-C  12/12/2022, 08:55      Electronically signed by Cloria Spring, PA-C

## 2022-12-13 LAB — HEPATITIS C VIRUS (HCV) RNA DETECTION AND QUANTIFICATION, PCR, PLASMA
HCV QUANTITATIVE PCR: 418000 IU/ML — ABNORMAL HIGH
HCV QUANTITATIVE RNA LOG: 5.62 LOG10 — ABNORMAL HIGH

## 2022-12-18 LAB — HEPATITIS C VIRUS (HCV) GENOTYPE, PLASMA
HCV GENOTYPE 3: DETECTED — AB
HEPATITIS C: DETECTED — AB

## 2022-12-20 ENCOUNTER — Encounter (INDEPENDENT_AMBULATORY_CARE_PROVIDER_SITE_OTHER): Payer: Self-pay | Admitting: PHYSICIAN ASSISTANT

## 2022-12-21 LAB — LIVER FIBROSIS, FIBROTEST PNL
ALPHA-2-MACROGLOBULIN: 178 mg/dL (ref 106–279)
ALT: 68 U/L — ABNORMAL HIGH (ref 9–46)
APOLIPOPROTEIN A1: 150 mg/dL (ref 94–176)
FIBROSIS SCORE: 0.08
GGT: 26 U/L (ref 3–95)
HAPTOGLOBIN: 273 mg/dL — ABNORMAL HIGH (ref 43–212)
NECROINFLAMMAT ACT SCORE: 0.33
REFERENCE ID: 5069991
TOTAL BILIRUBIN: 0.3 mg/dL (ref 0.2–1.2)

## 2023-01-03 ENCOUNTER — Other Ambulatory Visit (HOSPITAL_BASED_OUTPATIENT_CLINIC_OR_DEPARTMENT_OTHER): Payer: Self-pay | Admitting: Physician Assistant

## 2023-01-03 ENCOUNTER — Other Ambulatory Visit: Payer: Self-pay

## 2023-01-03 MED ORDER — SOFOSBUVIR 400 MG-VELPATASVIR 100 MG TABLET
1.0000 | ORAL_TABLET | Freq: Every day | ORAL | 2 refills | Status: DC
  Filled 2023-01-03: qty 28, 28d supply, fill #0

## 2023-01-04 ENCOUNTER — Other Ambulatory Visit: Payer: Self-pay

## 2023-01-04 NOTE — Telephone Encounter (Signed)
Prior authorization required for prescription Epclusa received by [facility] on 01/04/2023.  Benefits investigation to be completed.

## 2023-01-04 NOTE — Telephone Encounter (Signed)
01/04/2023 JLeBlanc- All labs complete but needs hep b vaccine. Outreach to PT he is planning on going to the health department today. Said he would drop off documentation to provider when completed.

## 2023-01-09 ENCOUNTER — Other Ambulatory Visit: Payer: Self-pay

## 2023-01-10 ENCOUNTER — Other Ambulatory Visit: Payer: Self-pay

## 2023-01-10 NOTE — Telephone Encounter (Signed)
Still waiting on hep b vaccine. Bryan [name] he went to get it done and could not afford it. Is going to talk to his primary care to see if she can order it.

## 2023-01-15 ENCOUNTER — Encounter (HOSPITAL_COMMUNITY): Payer: Self-pay

## 2023-01-15 ENCOUNTER — Other Ambulatory Visit: Payer: Self-pay

## 2023-01-15 ENCOUNTER — Emergency Department (HOSPITAL_COMMUNITY): Payer: Medicare Other

## 2023-01-15 ENCOUNTER — Other Ambulatory Visit (HOSPITAL_COMMUNITY): Payer: Self-pay | Admitting: PODIATRY

## 2023-01-15 ENCOUNTER — Emergency Department
Admission: EM | Admit: 2023-01-15 | Discharge: 2023-01-15 | Disposition: A | Discharge status: Home / Self Care | Payer: Medicare Other

## 2023-01-15 DIAGNOSIS — S92511A Displaced fracture of proximal phalanx of right lesser toe(s), initial encounter for closed fracture: Secondary | ICD-10-CM

## 2023-01-15 DIAGNOSIS — M79676 Pain in unspecified toe(s): Secondary | ICD-10-CM

## 2023-01-15 DIAGNOSIS — S92512A Displaced fracture of proximal phalanx of left lesser toe(s), initial encounter for closed fracture: Secondary | ICD-10-CM | POA: Insufficient documentation

## 2023-01-15 DIAGNOSIS — M79674 Pain in right toe(s): Secondary | ICD-10-CM | POA: Insufficient documentation

## 2023-01-15 DIAGNOSIS — W228XXA Striking against or struck by other objects, initial encounter: Secondary | ICD-10-CM | POA: Insufficient documentation

## 2023-01-15 DIAGNOSIS — W2209XA Striking against other stationary object, initial encounter: Secondary | ICD-10-CM

## 2023-01-15 MED ORDER — ACETAMINOPHEN 325 MG TABLET
650.0000 mg | ORAL_TABLET | ORAL | Status: AC
Start: 2023-01-15 — End: 2023-01-15
  Administered 2023-01-15: 650 mg via ORAL
  Filled 2023-01-15: qty 2

## 2023-01-15 NOTE — ED Provider Notes (Signed)
Bryan [name]  06-24-1975  47 y.o.  male    Chief Complaint:   Chief Complaint   Patient presents with    Toe Injury        HPI: This is a 47 y.o. male who presents to the emergency department for complaints of toe injury.  Patient reports that he accidentally struck his right 4th and 5th toe onto address her last night and has now pain and swelling to his right 4th and 5th toes.  Patient has good pedal pulses.  Patient has less than 3 seconds cap refill.  Good sensation.  ?   Past Medical History:   Past Medical History:   Diagnosis Date    Anxiety     Back pain     COPD (chronic obstructive pulmonary disease) (CMS HCC)     Depression     Osteochondrosis     right foot      Past Surgical History:   Past Surgical History:   Procedure Laterality Date    Ankle surgery      Ankle surgery Right     Hx hernia repair      Hx lumbar spine surgery      Hx rotator cuff repair Right       Social History:   Social History     Tobacco Use    Smoking status: Every Day     Current packs/day: 1.00     Average packs/day: 1 pack/day for 30.0 years (30.0 ttl pk-yrs)     Types: Cigarettes    Smokeless tobacco: Never   Vaping Use    Vaping status: Some Days    Substances: THC   Substance Use Topics    Alcohol use: Never    Drug use: Yes     Frequency: 7.0 times per week     Types: Marijuana, Opioid      Social History     Substance and Sexual Activity   Drug Use Yes    Frequency: 7.0 times per week    Types: Marijuana, Opioid      Allergies:   Allergies   Allergen Reactions    Latex Rash    Adhesive Rash    Other Rash     Petroleum products    Tramadol Rash    Meloxicam     Naloxone     Vistaril [Hydroxyzine Hcl] Swelling      ?     Review of Systems: Other than pertinent positives discussed in HPI, all other systems reviewed and are negative.  ?   Physical Exam:     BP (!) 128/91   Pulse 74   Temp 36.7 C (98 F)   Resp 16   Ht 1.778 m (5\' 10" )   Wt 94.8 kg (209 lb)   SpO2 98%   BMI 29.99 kg/m        General: Patient alert  and oriented.  Appears to be in no acute distress.  HEENT: Head: Normocephalic, atraumatic. Eyes:  Pupils equal and reactive bilaterally.  Extraocular muscles intact in all directions.  Normal conjunctiva. Oral mucosa moist.  MS: Appropriate ROM. Normal Strength.  Swelling noted to the right 5th toe.  Good pedal pulses to the right foot.  Good sensation to right foot.  Cap refill less than 3 seconds  Neuro: Alert and oriented to person, place, time, and situation. CN II-XII grossly intact.  No focal motor or sensory deficits. GCS 15.  Skin: Warm and dry. No cyanosis.  No rash appreciate.      LAB: No results found for this or any previous visit (from the past 12 hour(s)).       Radiology:    XR FOOT RIGHT performed on 01/15/2023 1:02 PM     CLINICAL HISTORY: foot injury, attention to 4th and 5th toes, struck toes last night onto dresser.  pain 4th and 5th digit / struck toe on dresser last pm     TECHNIQUE:  3 views of the right foot.     COMPARISON:  None.     FINDINGS:   There is a transverse fracture through the proximal shaft of the fifth proximal phalanx. This spares the articular surface. There is surrounding swelling  Slight narrowing first MTP joint.  As before there is severe arthritic narrowing of the ankle joint, with irregularity of the articular surfaces]. This was described on a prior exam from 07/27/2021        IMPRESSION:  Slightly displaced fracture fifth proximal phalanx     Emergency room course and Medical Decision Making: Patient was triaged, vital signs were obtained, patient was placed in a room. Nurses notes reviewed.  H&P was obtained. After examining the patient, x-ray of the right foot attention to the right 4th and 5th toes were ordered to evaluate for any acute bony injury.  Patient given Tylenol 650 mg p.o. for his pain.?   X-ray of the right foot shows a slightly displaced fracture of the 5th proximal phalanx.  Discussed results with patient.  A referral was sent to [facility] for  follow up.  Patient was offered a postop shoe for use however patient reports that he has been at home.  Patient was advised to take Tylenol and/or ibuprofen as needed for pain.  Patient was also advised to follow up with his PCP and to return to the ED for any worsening symptoms.  Patient verbalized understanding and agreeable to plan.  All questions answered.    This note was partially created using voice recognition software and is inherently subject to errors including those of syntax and "sound alike " substitutions which may escape proof reading.  In such instances, original meaning may be extrapolated by contextual derivation.     ?   Clinical Impression:   Clinical Impression   Closed displaced fracture of proximal phalanx of lesser toe of left foot, initial encounter (Primary)        Disposition: Discharged    Melchor Amour, FNP-C

## 2023-01-15 NOTE — Telephone Encounter (Signed)
waiting on PT to see his primary care to get vaccine done.

## 2023-01-15 NOTE — Discharge Instructions (Addendum)
You were evaluated here in the ED for toe injury.  X-rays of her left foot shows a fracture of her 5th toe.  A referral was sent to [facility].  They will contact you and schedule an appointment for follow up.  A postop shoe was provided to you to help with stabilization of your 5th toe.  You may take Tylenol and/or ibuprofen as needed for pain.  Follow up with your PCP  Return to the ED for any worsening symptoms

## 2023-01-15 NOTE — ED Triage Notes (Signed)
Hit right 5th and 4th toes on something last night and thinks he broke them.

## 2023-01-16 ENCOUNTER — Other Ambulatory Visit (HOSPITAL_COMMUNITY): Payer: Self-pay | Admitting: PODIATRY

## 2023-01-16 ENCOUNTER — Other Ambulatory Visit: Payer: Self-pay

## 2023-01-16 ENCOUNTER — Inpatient Hospital Stay (HOSPITAL_COMMUNITY)
Admission: RE | Admit: 2023-01-16 | Discharge: 2023-01-16 | Disposition: A | Discharge status: Home / Self Care | Payer: Medicare Other | Source: Ambulatory Visit | Attending: Emergency Medicine | Admitting: Emergency Medicine

## 2023-01-16 ENCOUNTER — Encounter (HOSPITAL_COMMUNITY): Payer: Self-pay | Admitting: PODIATRY

## 2023-01-16 ENCOUNTER — Ambulatory Visit: Payer: Medicare Other | Attending: PODIATRY | Admitting: PODIATRY

## 2023-01-16 VITALS — HR 89 | Resp 18 | Ht 70.0 in | Wt 199.0 lb

## 2023-01-16 DIAGNOSIS — W2209XA Striking against other stationary object, initial encounter: Secondary | ICD-10-CM

## 2023-01-16 DIAGNOSIS — S92512A Displaced fracture of proximal phalanx of left lesser toe(s), initial encounter for closed fracture: Secondary | ICD-10-CM | POA: Insufficient documentation

## 2023-01-16 DIAGNOSIS — M19079 Primary osteoarthritis, unspecified ankle and foot: Secondary | ICD-10-CM | POA: Insufficient documentation

## 2023-01-16 DIAGNOSIS — S92514A Nondisplaced fracture of proximal phalanx of right lesser toe(s), initial encounter for closed fracture: Secondary | ICD-10-CM | POA: Insufficient documentation

## 2023-01-16 DIAGNOSIS — M79674 Pain in right toe(s): Secondary | ICD-10-CM | POA: Insufficient documentation

## 2023-01-16 NOTE — Progress Notes (Addendum)
Clinic Note    Date: 01/16/2023  Patient Name: Bryan [name]  MRN: [id number]    Subjective:  Bryan [name] is a 47 y.o. male presenting to the clinic as a new patient after a recent injury. He reports that 2 days ago he kicked a door and had immediate pain. He presented to the emergency room yesterday and was told that he broke his toe. Ambulates with a cane due to his injury. He has a history of an ankle fracture 20 years ago and has some occasional ankle pain.    Allergies:   Allergies   Allergen Reactions    Latex Rash    Adhesive Rash    Other Rash     Petroleum products    Tramadol Rash    Meloxicam     Naloxone     Vistaril [Hydroxyzine Hcl] Swelling      Past Medical Hx:   Past Medical History:   Diagnosis Date    Anxiety     Back pain     COPD (chronic obstructive pulmonary disease) (CMS HCC)     Depression     Osteochondrosis     right foot         Past Surgical Hx:   Past Surgical History:   Procedure Laterality Date    ANKLE SURGERY      ANKLE SURGERY Right     cleaned out arthritis    HX HERNIA REPAIR      HX LUMBAR SPINE SURGERY      HX ROTATOR CUFF REPAIR Right          Social Hx:   Social History     Tobacco Use    Smoking status: Every Day     Current packs/day: 1.00     Average packs/day: 1 pack/day for 30.0 years (30.0 ttl pk-yrs)     Types: Cigarettes    Smokeless tobacco: Never   Substance Use Topics    Alcohol use: Never     Family Hx:   Family Medical History:       Problem Relation (Age of Onset)    Cancer Father              ROS:  ROS   The remainder of the review of systems is negative in detail.    Physical Exam:  Vitals:    01/16/23 1357   Pulse: 89   Resp: 18   SpO2: 96%   Weight: 90.3 kg (199 lb)   Height: 1.778 m (5\' 10" )   BMI: 28.61            Gen: NAD, AAOx3  Vascular: DP/PT pulses Palpable. normal CRT to all digits. Foot is warm to touch. Pedal hair growth is present. Significant swelling and pain to the right 5th digit.   Neuro: Light touch sensation is normal bilaterally  Achilles tendon reflex 2+; no clonus; negative babinski; tinel's and valleix sign negative  Derm: Erythema to the right 5th digit. normal skin texture and turgor; no lesions or wounds; interspaces clean and dry; no calor  MSK: Pain to palpation to the right 5th digit proximal phalanx. Decreased range of motion of the right ankle with crepitus. Guarded ROM of the right 5th digit.    Imaging:  Independent review of 3WB R foot XR: Transverse, minimally displaced fracture of the proximal phalanx of the right 5th digit. Significant joint space narrowing of the right ankle noted with retained anchors.     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 Physical exam and radiographic findings discussed with the patient today. Presenting 2 days after an acute injury to his right 5th digit. We discussed the course of healing of the patient and that it would take about 6-8 weeks for his bone to heal. Dispensed a surgical shoe today. This has the ability to provide stability, prevent motion, and reduce pain. Recommended the patient to take anti inflammatory medication for pain. He understands and agrees with the plan    Bertram Denver, DPM  Foot & Ankle Fellow   I saw and examined the patient.  I reviewed the fellow's note.  I agree with the findings and plan of care as documented in the fellow's note.  Any exceptions/additions are edited/noted.    Agustin Cree, DPM     This note was partially created using M*Modal fluency direct system (voice recognition software ) and is inherently subject to errors including those of syntax and "sound- alike" substitutions which may escape proofreading.  In such instances,  original meaning may be extrapolated by contextual derivation.

## 2023-01-18 NOTE — Telephone Encounter (Signed)
Dr. Gildardo Cranker addended his office note from 10-24-22.  Printed and faxed to [employer], [place]  01/18/2023 12:36

## 2023-01-18 NOTE — Telephone Encounter (Signed)
Message resent to Dr. Gildardo Cranker.  [employer] Elizur called to check on the status of this request.    Can you make an addendum to the note or just send information to me in this telephone encounter and I can print and send to Elizur to get this patient the AFO you ordered.  [place], [place]  01/18/2023 10:59

## 2023-01-20 ENCOUNTER — Emergency Department
Admission: EM | Admit: 2023-01-20 | Discharge: 2023-01-20 | Disposition: A | Discharge status: Home / Self Care | Payer: Medicare Other | Source: Home / Self Care

## 2023-01-20 ENCOUNTER — Other Ambulatory Visit: Payer: Self-pay

## 2023-01-20 ENCOUNTER — Encounter (HOSPITAL_COMMUNITY): Payer: Self-pay

## 2023-01-20 DIAGNOSIS — Z1152 Encounter for screening for COVID-19: Secondary | ICD-10-CM | POA: Insufficient documentation

## 2023-01-20 DIAGNOSIS — R059 Cough, unspecified: Secondary | ICD-10-CM | POA: Insufficient documentation

## 2023-01-20 DIAGNOSIS — Z20822 Contact with and (suspected) exposure to covid-19: Secondary | ICD-10-CM | POA: Insufficient documentation

## 2023-01-20 LAB — COVID-19 ~~LOC~~ MOLECULAR LAB TESTING
INFLUENZA VIRUS TYPE A: NOT DETECTED
INFLUENZA VIRUS TYPE B: NOT DETECTED
RESPIRATORY SYNCTIAL VIRUS (RSV): NOT DETECTED
SARS-CoV-2: NOT DETECTED

## 2023-01-20 MED ORDER — GUAIFENESIN ER 600 MG TABLET, EXTENDED RELEASE 12 HR
600.0000 mg | EXTENDED_RELEASE_TABLET | Freq: Two times a day (BID) | ORAL | Status: AC
Start: 2023-01-20 — End: ?

## 2023-01-20 NOTE — Discharge Instructions (Signed)
You evaluated here in the ED for a cough and exposure to COVID  Your COVID, flu, RSV swabs were negative.  You may treat your symptoms accordingly.  You may take over-the-counter cough medicine for your cough.   Follow up with your PCP  Return to the ED for any worsening symptoms

## 2023-01-20 NOTE — ED Nurses Note (Signed)
Patient discharged home.  AVS reviewed with patient/care giver.  A written copy of the AVS and discharge instructions was given to the patient/care giver.  Questions sufficiently answered as needed.  Patient/care giver encouraged to follow up with PCP as indicated.  In the event of an emergency, patient/care giver instructed to call 911 or go to the nearest emergency room.

## 2023-01-20 NOTE — ED Triage Notes (Signed)
C/o cough, chills feels drained and was around some people with covid.

## 2023-01-20 NOTE — ED Provider Notes (Signed)
Bryan [name]  02-02-1976  47 y.o.  male    Chief Complaint:   Chief Complaint   Patient presents with    Exposure To Coronavirus        HPI: This is a 47 y.o. male who presents to the emergency department for complaints of cough, chills. Patient reports that his symptoms started yesterday.  Patient also reports that he has been exposed to someone who tested positive for COVID.  Patient denies any chest pain, shortness a breath.  Patient denies any abdominal pain, nausea, vomiting, diarrhea.  Patient reports that he has not been treating his symptoms.  ?   Past Medical History:   Past Medical History:   Diagnosis Date    Anxiety     Back pain     COPD (chronic obstructive pulmonary disease) (CMS HCC)     Depression     Osteochondrosis     right foot      Past Surgical History:   Past Surgical History:   Procedure Laterality Date    Ankle surgery      Ankle surgery Right     Hx hernia repair      Hx lumbar spine surgery      Hx rotator cuff repair Right       Social History:   Social History     Tobacco Use    Smoking status: Every Day     Current packs/day: 1.00     Average packs/day: 1 pack/day for 30.0 years (30.0 ttl pk-yrs)     Types: Cigarettes    Smokeless tobacco: Never   Vaping Use    Vaping status: Some Days    Substances: THC   Substance Use Topics    Alcohol use: Never    Drug use: Yes     Frequency: 7.0 times per week     Types: Marijuana, Opioid      Social History     Substance and Sexual Activity   Drug Use Yes    Frequency: 7.0 times per week    Types: Marijuana, Opioid      Allergies:   Allergies   Allergen Reactions    Latex Rash    Adhesive Rash    Other Rash     Petroleum products    Tramadol Rash    Meloxicam     Naloxone     Vistaril [Hydroxyzine Hcl] Swelling      ?     Review of Systems: Other than pertinent positives discussed in HPI, all other systems reviewed and are negative.  ?   Physical Exam:     BP (!) 132/95   Pulse 69   Temp (!) 35.9 C (96.7 F)   Resp 16   Ht 1.778 m (5'  10")   Wt 90.3 kg (199 lb)   SpO2 99%   BMI 28.55 kg/m        General: Patient alert and oriented.  Appears to be in no acute distress.  HEENT: Head: Normocephalic, atraumatic. Eyes:  Pupils equal and reactive bilaterally.  Extraocular muscles intact in all directions.  Normal conjunctiva. Oral mucosa moist.  Respiratory: No respiratory distress noted.  Lungs are clear and equal bilaterally.  No wheezing, rales, or rhonchi.    Heart:  Heart rate and rhythm regular without murmur.  Strong peripheral pulses present distally bilaterally.  Abdomen:  Abdomen was soft, nontender.  No rebound guarding or masses noted.  Bowel sounds were present.  MS: Appropriate ROM.  Normal Strength.   Neuro: Alert and oriented to person, place, time, and situation. CN II-XII grossly intact.  No focal motor or sensory deficits. GCS 15.  Skin: Warm and dry. No cyanosis.  No rash appreciate.      LAB:   Results for orders placed or performed during the hospital encounter of 01/20/23 (from the past 12 hour(s))   COVID-19 SCREENING - Asymptomatic   Result Value Ref Range    SARS-CoV-2 Not Detected Not Detected    INFLUENZA VIRUS TYPE A Not Detected Not Detected    INFLUENZA VIRUS TYPE B Not Detected Not Detected    RESPIRATORY SYNCTIAL VIRUS (RSV) Not Detected Not Detected              Emergency room course and Medical Decision Making: Patient was triaged, vital signs were obtained, patient was placed in a room. Nurses notes reviewed.  H&P was obtained. After examining the patient, patient's differential diagnoses include but not limited to viral illness, bronchitis, pneumonia.   Covid, flu, RSV negative.  Discussed results with patient.  Patient has not been febrile at home or here in the ED. patient's oxygen saturation has been normal here in the ED. patient is not tachycardic.  I believe that patient is stable to be discharged home.  Patient was advised to take over-the-counter cough medication.  Patient verbalized understanding and  agreeable to plan.  Patient was advised to follow up with his PCP and to return to the ED for any worsening symptoms.  Patient verbalized understanding and agreeable to plan.  All questions answered.    This note was partially created using voice recognition software and is inherently subject to errors including those of syntax and "sound alike " substitutions which may escape proof reading.  In such instances, original meaning may be extrapolated by contextual derivation.     ?   Clinical Impression:   Clinical Impression   Cough, unspecified type (Primary)   Cough with exposure to COVID-19 virus        Disposition: Discharged    Melchor Amour, FNP-C

## 2023-01-21 ENCOUNTER — Other Ambulatory Visit: Payer: Self-pay

## 2023-01-21 NOTE — Telephone Encounter (Signed)
Patient said he will get vaccine on 10/3

## 2023-01-28 ENCOUNTER — Other Ambulatory Visit: Payer: Self-pay

## 2023-01-28 NOTE — Telephone Encounter (Signed)
01/28/2023 JL- patient is scheduled to get vaccine on 10/3

## 2023-02-04 ENCOUNTER — Other Ambulatory Visit: Payer: Self-pay

## 2023-02-04 NOTE — Telephone Encounter (Signed)
02/04/2023 JL- called to check in on PT getting Hep B vaccine. Voicemail not set up for either number.

## 2023-02-11 ENCOUNTER — Other Ambulatory Visit: Payer: Self-pay

## 2023-02-11 NOTE — Telephone Encounter (Signed)
Inbasket message to provider.

## 2023-02-12 ENCOUNTER — Other Ambulatory Visit: Payer: Self-pay

## 2023-02-12 NOTE — Telephone Encounter (Signed)
Being put on hold.

## 2023-02-26 ENCOUNTER — Other Ambulatory Visit (HOSPITAL_COMMUNITY): Payer: Self-pay | Admitting: PODIATRY

## 2023-02-26 DIAGNOSIS — S92512A Displaced fracture of proximal phalanx of left lesser toe(s), initial encounter for closed fracture: Secondary | ICD-10-CM

## 2023-02-27 ENCOUNTER — Ambulatory Visit (HOSPITAL_COMMUNITY): Payer: Self-pay | Admitting: PODIATRY

## 2023-03-19 ENCOUNTER — Ambulatory Visit (HOSPITAL_BASED_OUTPATIENT_CLINIC_OR_DEPARTMENT_OTHER): Payer: Medicare Other | Admitting: Physician Assistant

## 2023-11-12 ENCOUNTER — Ambulatory Visit (HOSPITAL_BASED_OUTPATIENT_CLINIC_OR_DEPARTMENT_OTHER): Payer: Self-pay | Admitting: MOHS-Micrographic Surgery

## 2023-12-11 ENCOUNTER — Ambulatory Visit (HOSPITAL_BASED_OUTPATIENT_CLINIC_OR_DEPARTMENT_OTHER): Payer: Self-pay | Admitting: MOHS-Micrographic Surgery

## 2023-12-19 NOTE — H&P (Signed)
 [facility]  [address]  [place] [address]  Operated by [facility]     Name: Bryan [name] MRN:  [id number]   Date: 12/23/2023 Age: 48 y.o.       MOHS MICROGRAPHIC SURGERY/HISTORY AND PHYSICAL  Diagnosis and Location: BCC of left inferior lateral neck  Pathology Report #: [place] D[facility]at[facility], under media from 10/24/23  Underlying nerves or other vital structures intact: Yes  Previous treatment(s) and date(s): No     Medical History  Smoking or chewing tobacco: Yes - 1 ppd cigarettes  Alcohol Use: No  Drug addiction: Yes - heroin, meth, marijuana  Bleeding tendency: No  Bleeding disorders or low platelets: No  Blood thinners: No    Cardiac and Circulatory History  Angina: No  CHF: No  Stents: No  Heart bypass: No  MI: No  Pacemaker: No  Implanted Defibrillator: No  Neurostimulator: No  Other Electronic Implants: No  Valvular Heart Disease: No  Prosthetic Heart Device: No  Arrhythmia: No  Endocarditis/Pericarditis: Yes  Peripheral Vascular Disease: No    Pulmonary Disease  Pulmonary Embolus, blood clots: No  Asthma: No  COPD: Yes  Any problems lying flat? No  DOE (If yes characterized by severity, type of activity): No    Other  Convulsions: No  Stroke: No  Passing out or dizziness with vaccination, venipuncture, blood, pain, other: No  Hepatitis/Cirrhosis: Yes - Hepatitis C.  Kidney disease: No  HIV: No  Herpes or cold sore: No  Immunosuppression: No  History of wound infections: No  Prosthetic hips, knees, shoulders, implants: History of spinal fusion  Previous radiation treatment: No  Glaucoma: No  Hyperthyroidism: No  Diabetes: No  HTN: No  Other medical conditions: No    Patient is not a male of childbearing age.    Medications:  Current Outpatient Medications   Medication Sig    diphenhydrAMINE  (BENADRYL ) 50 mg Oral Capsule Take 1 Capsule (50 mg total) by mouth Every night as needed (Patient not taking: Reported on 12/23/2023)     furosemide (LASIX) 80 mg Oral Tablet Take 1 Tablet (80 mg total) by mouth Daily    guaiFENesin  (MUCINEX ) 600 mg Oral Tablet Extended Release 12hr Take 1 Tablet (600 mg total) by mouth Every 12 hours (Patient not taking: Reported on 12/23/2023)    Ibuprofen  (MOTRIN ) 200 mg Oral Tablet Take 3 Tablets (600 mg total) by mouth Three times a day as needed for Pain    OLANZapine (ZYPREXA) 15 mg Oral Tablet Take 20 mg by mouth    PARoxetine  (PAXIL ) 20 mg Oral Tablet Take 1 Tablet (20 mg total) by mouth Every morning for 30 days    sofosbuvir -velpatasvir  (EPCLUSA ) 400-100 mg Oral Tablet Take 1 Tablet by mouth Once a day (Patient not taking: Reported on 12/23/2023)    tamsulosin (FLOMAX) 0.4 mg Oral Capsule Take 1 Capsule (0.4 mg total) by mouth Every evening after dinner       Physical Exam  Vitals: BP 115/70 (Patient Position: Sitting)   Pulse 90   Temp 36.8 C (98.2 F) (Oral)   Resp 18   Wt 113 kg (250 lb)   SpO2 94%   BMI 35.87 kg/m     Constitutional: Very pleasant, AAOx3, WDWN, NAD, cooperative.  HEENT: Normocephalic, atraumatic. PERRLA. No scleral icterus.  Neck: Trachea midline, supple. No tracheal deviation. No cervical or supraclavicular lymphadenopathy noted on palpation.  Cardiovascular: RRR; No murmurs, rubs, or gallops present. S1, S2  normal.  Pulmonary: Lungs CTA bilaterally; No wheezes, rales, or rhonchi observed. Normal respiratory effort. No retractions.  Lymphatics: No palpable lymph nodes in submandibular, cervical, clavicular, or axillary nodal chains bilaterally.  Extremities: No peripheral edema; No cyanosis or clubbing of nails.  Psychiatric: Normal mood and affect; Judgement and thought content normal.  Neurology:      Motor function in area of surgery: Normal.       Sensory function in area of surgery: Normal.  Skin: Well healed biopsy site of the left inferior lateral neck    Allergies:  Allergies[1]    Assessment/Plan  Bryan [name] is a 48 y.o. male here for evaluation of:    BCC of left  inferior lateral neck    Discussed Mohs Micrographic Surgery in detail, including possible risks, benefits, possible complications, and alternative treatment options. Patient and Mohs team (physician, APP, nursing staff) agree that Mohs Micrographic Surgery is the best therapeutic plan.  Mohs Appropriate Use Criteria (AUC): 7 (Lesion is appropriate for treatment by Mohs Surgery)  Informed consent and photo consent obtained.   Prescription(s) given for:  Doxycycline  100mg  PO BID for 14 days (take with a full glass of water  on a full stomach to prevent GI upset; avoid direct sunlight)  Day of instructions provided:   Advised pt to eat a hearty meal before the surgery.  Advised pt to bring snacks and something to occupy their mind such as book, tablet during the waiting periods.  Advised pt that they can bring one person to accompany them, but that this person would have to wait in the waiting room during the procedures.    Please schedule patient for Mohs surgery.    Dr. Mickael personally discussed case with patient. The patient was seen as a shared visit with cosigning faculty, Dr. Mickael, today. I independently of the faculty provider spent a total of 15 minutes in direct/indirect care of this patient including initial evaluation, review of laboratory, radiology, diagnostic studies, review of medical record, order entry and coordination of care.    [address] Nichelle Likar, PA-C 12/23/2023, 15:43  Physician Assistant Certified  [facility]  [facility] D[facility]atology    I saw and examined the patient. I was present and participated in the development of the treatment plan. I was present and supervised/observed all procedures in their entirety. I reviewed the PA Huntley Maples MD)'s note. I agree with the findings and plan of care as documented in the PA's note.  Any exceptions/ additions are edited/noted.    Brigitte Mickael MD, PhD  Mohs Micrographic Surgery  Department of Dermatology        [1]   Allergies  Allergen Reactions     Latex Rash    Adhesive Rash    Other Rash     Petroleum products    Tramadol Rash    Meloxicam     Naloxone     Vistaril [Hydroxyzine Hcl] Swelling

## 2023-12-23 ENCOUNTER — Ambulatory Visit: Payer: Self-pay | Attending: MOHS-Micrographic Surgery | Admitting: MOHS-Micrographic Surgery

## 2023-12-23 ENCOUNTER — Encounter (HOSPITAL_BASED_OUTPATIENT_CLINIC_OR_DEPARTMENT_OTHER): Payer: Self-pay | Admitting: MOHS-Micrographic Surgery

## 2023-12-23 ENCOUNTER — Other Ambulatory Visit: Payer: Self-pay

## 2023-12-23 VITALS — BP 115/70 | HR 90 | Temp 98.2°F | Resp 18 | Wt 250.0 lb

## 2023-12-23 DIAGNOSIS — Z01818 Encounter for other preprocedural examination: Secondary | ICD-10-CM | POA: Insufficient documentation

## 2023-12-23 DIAGNOSIS — C4441 Basal cell carcinoma of skin of scalp and neck: Secondary | ICD-10-CM | POA: Insufficient documentation

## 2023-12-23 MED ORDER — DOXYCYCLINE HYCLATE 100 MG CAPSULE
100.0000 mg | ORAL_CAPSULE | Freq: Two times a day (BID) | ORAL | 0 refills | Status: AC
Start: 2023-12-23 — End: 2024-01-06

## 2024-01-09 NOTE — Addendum Note (Signed)
 Addended by: MICKAEL BRIGITTE GUTTA on: 01/09/2024 08:03 PM     Modules accepted: Level of Service

## 2024-02-11 ENCOUNTER — Ambulatory Visit (HOSPITAL_BASED_OUTPATIENT_CLINIC_OR_DEPARTMENT_OTHER): Payer: Self-pay | Admitting: MOHS-Micrographic Surgery

## 2024-02-25 ENCOUNTER — Encounter (HOSPITAL_BASED_OUTPATIENT_CLINIC_OR_DEPARTMENT_OTHER): Payer: Self-pay | Admitting: MOHS-Micrographic Surgery

## 2024-02-25 ENCOUNTER — Other Ambulatory Visit: Payer: Self-pay

## 2024-02-25 ENCOUNTER — Ambulatory Visit: Payer: Self-pay | Attending: MOHS-Micrographic Surgery | Admitting: MOHS-Micrographic Surgery

## 2024-02-25 VITALS — BP 136/79 | HR 72 | Temp 98.1°F | Resp 20 | Wt 267.0 lb

## 2024-02-25 DIAGNOSIS — L281 Prurigo nodularis: Secondary | ICD-10-CM | POA: Insufficient documentation

## 2024-02-25 DIAGNOSIS — C4441 Basal cell carcinoma of skin of scalp and neck: Secondary | ICD-10-CM | POA: Insufficient documentation

## 2024-02-25 DIAGNOSIS — D485 Neoplasm of uncertain behavior of skin: Secondary | ICD-10-CM

## 2024-02-25 DIAGNOSIS — D492 Neoplasm of unspecified behavior of bone, soft tissue, and skin: Secondary | ICD-10-CM

## 2024-02-25 MED ORDER — LIDOCAINE 1%-EPI 1:100,000 BUFFERED WITH SOD BICARB (3 ML) INJ SYRINGE
3.0000 mL | INJECTION | Freq: Once | INTRAMUSCULAR | Status: AC
Start: 2024-02-25 — End: 2024-02-25
  Administered 2024-02-25: 12 mL via INTRAMUSCULAR

## 2024-02-25 MED ORDER — LIDOCAINE 1%-EPI 1:100,000 BUFFERED WITH SOD BICARB (3 ML) INJ SYRINGE
INJECTION | INTRAMUSCULAR | Status: AC
Start: 2024-02-25 — End: 2024-02-25
  Filled 2024-02-25: qty 15

## 2024-02-25 NOTE — Progress Notes (Signed)
 [facility]  [address]  [place] [address]  Operated by [facility]     Name: Bryan [name] MRN:  [id number]   Date: 02/25/2024 Age: 48 y.o.     Mohs Day of Surgery Checklist:    Allergies[1]    Current Outpatient Medications   Medication Sig Dispense Refill    diphenhydrAMINE  (BENADRYL ) 50 mg Oral Capsule Take 1 Capsule (50 mg total) by mouth Every night as needed (Patient not taking: Reported on 12/23/2023)      furosemide (LASIX) 80 mg Oral Tablet Take 1 Tablet (80 mg total) by mouth Daily      guaiFENesin  (MUCINEX ) 600 mg Oral Tablet Extended Release 12hr Take 1 Tablet (600 mg total) by mouth Every 12 hours (Patient not taking: Reported on 12/23/2023)      Ibuprofen  (MOTRIN ) 200 mg Oral Tablet Take 3 Tablets (600 mg total) by mouth Three times a day as needed for Pain      OLANZapine (ZYPREXA) 15 mg Oral Tablet Take 20 mg by mouth      PARoxetine  (PAXIL ) 20 mg Oral Tablet Take 1 Tablet (20 mg total) by mouth Every morning for 30 days 30 Tablet 0    sofosbuvir -velpatasvir  (EPCLUSA ) 400-100 mg Oral Tablet Take 1 Tablet by mouth Once a day (Patient not taking: Reported on 12/23/2023) 28 Tablet 2    tamsulosin (FLOMAX) 0.4 mg Oral Capsule Take 1 Capsule (0.4 mg total) by mouth Every evening after dinner       No current facility-administered medications for this visit.       Brief Physical Exam:  There were no vitals taken for this visit.      Lymph Nodes: no lymph nodes palpable in cervical neck and supraclavicular areas  Skin: scar at biopsy site on left inferior lateral neck    MOHS OPERATIVE REPORT    Testing Laboratory:  [facility]  1st Floor Mohs Surgery Lab  [place]  [address]  [place], [place] [address]    Date(s):  02/25/2024    Patient:  Bryan [name]                                                  [facility]#: [id number]                 Faculty Surgeon: Brigitte Martes, M.D., Ph.D.    Resident Surgeon: Edsel Mate, MD    _____________________________________________________________________    Location: Left inferior lateral neck    Preoperative Diagnosis:  [facility]    Postoperative Diagnosis: [facility]    Procedure: Mohs Micrographic Surgery     Pretreatment Size:  3.9 cm x 2.8 cm       Stage: 1  Blocks: 1  Tumor seen: negative  Depth of Invasion: negative  Scar: no  PNI: no    Final Wound Size: 5.0 cm x 4.0 cm    Anesthesia: 9.0 ml lidocaine  1% w/epinephrine 1:100,000        Estimated Blood Loss:  minimal    Complications: none     Mohs Appropriate Use Criteria (AUC):  7 (Lesion is appropriate for treatment by Mohs Surgery)  _____________________________________________________________________    After a full discussion with the patient, who reviewed the options for further management, an informed consent for  surgery was obtained.  The patient identified the tumor site, the tumor was marked with a sterile surgical marking pen and the location was reconfirmed with the patient.     The patient was brought to the operating room and prepped and draped in the usual manner and a time out was performed.  The tissue surrounding the tumor was infiltrated with local anesthesia and a curette was used to remove the gross tumor.  The Mohs Stage I tissue specimen layer was resected and hemostasis was achieved through the use of the electrocautery.  The tissue specimen and corresponding skin adjacent to the defect were marked with hashes using a scalpel. The hashes were colored with ink of different colors as shown in the Mohs Map included herein. The tissue was cut to display en-face margins on slides that were subsequently stained with hematoxylin and eosin and examined under the microscope. The Mohs Surgeon, Dr. Brigitte Martes MD PhD, personally examined the histopathology slides and performed the Mohs surgery, acting as both the surgeon and the pathologist providing the histologic evaluation of the specimen. A dressing was applied and this  sequence was repeated until surgery was completed and negative margins were obtained.  Following completion of the Mohs micrographic surgery a full discussion with the patient addressed all of the alternative options for further wound management.  This included the option of no further treatment.     Mohs Procedure Progress Note:    Bryan [name] is a 48 y.o. male here for scheduled Mohs Micrographic Surgery.  Consent reviewed. Sterile prep with alcohol. Anesthesia with 1% lidocaine  w/epinephrine 1:100,000. Negative margins obtained using Mohs Micrographic Surgery techniques in 1 Stage. Discussed reconstructive options. Patient and physicians agree that primary closure is the best reconstructive option.    Primary Closure:  Length of Closure: 7.0 cm  Sutures Used: 2-0 PDS and 3-0 Monocryl deep dermal and 5-0 Fast gut epicuticular  Closure Type: intermediate  Estimated Blood Loss: minimal  Anesthesia: 3.0 ml lidocaine  1% w/epinephrine 1:100,000  Complications: none  Patient Information Given: yes  The defect was moderately undermined in the subcutaneous plane to reduce wound closure tension and allow for easy wound edge eversion. Hemostasis was obtained with the electro-cautery and vascular suture where needed. Interrupted, inverted buried sutures were placed, using the material mentioned above, to close the superficial fascia and skin of the wound. Interrupted and running simple cutaneous sutures were used to further approximate and evert the wound edges. The final length of the repair is listed in the summary above.  A firm pressure dressing was applied over petrolatum.  Verbal postoperative wound care instructions were given and a wound care hand out was provided. The patient was also told to call us  at anytime for signs of wound infection or any other concerns they might have regarding the surgery or the healing process. The patient tolerated the procedure well.      Instructions for dressing changes and  management of postoperative bleeding were discussed. Technique for holding pressure for post-operative bleeding was demonstrated.    Verbal postoperative wound care instructions were given and a wound care hand out was provided. The patient was asked to follow up as needed for a wound check/suture removal. The patient was also told to call us  at anytime for signs of wound infection or any other concerns they might have regarding the surgery or the healing process. The patient tolerated the procedure well.      #Neoplasm of uncertain behavior   -  Tangential biopsy was performed with intent to sample part of the lesion for diagnosis; it was explained that the remainder of the lesion was not removed from the patient. The consent form was thoroughly discussed and signed by the patient and/or primary care giver. The the patient was identified and procedure risk factors including bleeding, pain, and infection were discussed with patient and/or primary care giver. The site was cleaned and anesthetized. The biopsy was sent to pathology, and the bleeding was controlled. The wound was bandaged and wound care instructions were provided. There were no complications, and the patient tolerated the procedure well. It was advised that the patient return if future problems occur.   - Size: see above diagram  - Location of biopsy: Right medial wrist  - Differential diagnosis: BCC vs. Prurigo nodule  - Patient will be called with pathology results and appropriate follow up will be made.  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 Tissue submitted to pathology for permanent processing and evaluation.       Therisa Crazier, STUDENT PHYSICIAN ASSISTANT    I was present when the student was taking history, performing the exam, performing the procedures, and during any medical decision making activities. I personally verified the history, performed the exam, performed the procedures, and medical decision  making as edited in the student's note. I agree with the medical student's note.     Edsel Mate, MD    I was present when the student was taking history, performing the exam, performing the procedures, and during any medical decision making activities. I personally verified the history, performed the exam, performed the procedures, and medical decision making as edited in the student's note. I agree with the medical student's note.      I saw and examined the patient. I was present and participated in the development of the treatment plan. I was present and supervised/observed all procedures in their entirety. I reviewed the resident Lorrayne Mate, MD)'s note. I agree with the findings and plan of care as documented in the resident's note.  Any exceptions/ additions are edited/noted.    Brigitte Martes MD, PhD  Mohs Micrographic Surgery  Department of Dermatology        [1]   Allergies  Allergen Reactions    Latex Rash    Adhesive Rash    Other Rash     Petroleum products    Tramadol Rash    Meloxicam     Naloxone     Vistaril [Hydroxyzine Hcl] Swelling

## 2024-02-25 NOTE — Patient Instructions (Signed)
 02/25/2024  Our office hours are Monday through Thursday - 7:00 a.m. to 4:30 p.m. If you have any questions or concerns, please do not hesitate to contact us  at [phone].       CHANGE DRESSING DAILY BY FOLLOWING THE INSTRUCTIONS BELOW:     1. Remove the old dressing, shower 48 hours after surgery. We prefer you wait close to 48 hours to change your bandage. Shower with bandage off.     2. Use cotton swab to cleanse area with warm water . This may take several swabs.     3. If crusting starts and you can not remove it easily use warm soaks to soften the crust first for 15 minutes then follow dressing instructions.     4. Cut the telfa or nonstick dressing to fit the exact size of the stitches or wound.     5. Apply Vaseline to the telfa pad then place over area.     Note: Vaseline in a tube is preferred. If you do not have it in a tube and need to use Vaseline in a tub or container, please make sure of the following:  -Tub/container is new/unused (previously used containers can increase risk of infection)  -Use clean Qtip  to remove vaseline from container (do not use your fingers). Use a clean Qtip every time you insert it into the vaseline. This helps prevent cross contamination.    6. Cover the telfa with paper tape.     7. If the wound site is near the eye use Polysporin or Vaseline, available over the counter at your pharmacy.     8.  Keep head or extremity elevated to decrease swelling. Apply ice over your bandage 20 minutes out of every hour while you are awake and as much as possible in the first 24 hours after surgery. Use a sealed baggy over a towel or frozen bag of peas.     9. Tylenol  as needed as directed for pain.     10. Bleeding is rare but if it should occur, lie down and apply firm pressure to the site. If your bandage is wet you must take it off first. Direct pressure with a dry towel or gauze is applied for 15 minutes as noted by your watch. You are not to discontinue pressure to see if the  bleeding has stopped until a total of 15 minutes has elapsed. If the bleeding is continuing despite your efforts, you may not be pushing hard enough or the pressure is not being exerted in the right area. Try adjusting and reapplying pressure for a second 15 minutes. Also, if you are on a blood thinner, you will have to apply pressure for 30 minutes. If bleeding continues, call the office at [phone] immediately. If it is after hours, please call [phone] and explain to the answering service that you need to speak to the dermatology person on- call. It may be necessary to go to the nearest emergency room if you are unable to come into the office     11. It is completely normal to have some swelling and redness about the wound site. Swelling will be worse 2 days after surgery. If your wound is near the eye area, the eyelids may swell. This will gradually disappear over the next few days to a week. Some drainage from the wound will occur, and this may have an odor. This will usually stop after a few days. Please call for any questions or concerns.  12. Schedule an appointment to have your stitches removed in 7-14 days.     13. If you have dissolving sutures, you may stop bandaging in 7-10 days. Apply Vaseline with a cotton swab to suture line several times a day to keep suture line continually moist for an additional 3 days.     If you were told you did not need to return to our office you need to see your dermatologist in about 6 months for your normal skin check.     Supplies needed:   _____Vaseline (In a tube is preferred. If you do not have a tube, please refer to instruction number 5)  _____Non-stick dressing   _____Micropore paper tape   _____Q-tips     14. Take antibiotic if prescribed until gone.     15. Avoid any strenuous lifting, stretching straining or bending over during first week post surgery.     16. Cigarette smoking constricts blood vessels and it is recommended that you do not smoke  during your healing time post surgery.

## 2024-02-26 ENCOUNTER — Ambulatory Visit (HOSPITAL_BASED_OUTPATIENT_CLINIC_OR_DEPARTMENT_OTHER): Payer: Self-pay | Admitting: MOHS-Micrographic Surgery

## 2024-02-26 ENCOUNTER — Ambulatory Visit (HOSPITAL_BASED_OUTPATIENT_CLINIC_OR_DEPARTMENT_OTHER): Payer: Self-pay

## 2024-02-26 DIAGNOSIS — L281 Prurigo nodularis: Secondary | ICD-10-CM

## 2024-02-26 LAB — SURGICAL PATHOLOGY SPECIMEN

## 2024-02-26 NOTE — Telephone Encounter (Signed)
 [id number]-Tried to call patient to check in post operatively, when the voicemail box came up it was a woman's name that isn't listed on the patient's chart, no message left.  Larraine Monday, RN   02/26/2024 09:54

## 2024-02-27 NOTE — Result Encounter Note (Signed)
 [facility] was notified of the benign diagnosis. Will fax copy of the report to [phone] at their request.    Connell Miquel Maples, PA-C 02/27/2024, 09:08  Physician Assistant Certified  [facility]  [facility]

## 2024-05-02 ENCOUNTER — Encounter (HOSPITAL_COMMUNITY): Payer: Self-pay

## 2024-05-02 ENCOUNTER — Other Ambulatory Visit: Payer: Self-pay

## 2024-05-02 ENCOUNTER — Emergency Department
Admission: EM | Admit: 2024-05-02 | Discharge: 2024-05-02 | Disposition: A | Discharge status: Home / Self Care | Attending: Physician Assistant | Admitting: Physician Assistant

## 2024-05-02 DIAGNOSIS — M5441 Lumbago with sciatica, right side: Secondary | ICD-10-CM | POA: Insufficient documentation

## 2024-05-02 DIAGNOSIS — M5442 Lumbago with sciatica, left side: Secondary | ICD-10-CM | POA: Insufficient documentation

## 2024-05-02 MED ORDER — KETOROLAC 60 MG/2 ML INTRAMUSCULAR SOLUTION
60.0000 mg | INTRAMUSCULAR | Status: AC
  Administered 2024-05-02: 60 mg via INTRAMUSCULAR
  Filled 2024-05-02: qty 2

## 2024-05-02 MED ORDER — LIDOCAINE 4 % TOPICAL PATCH: 1.0000 | MEDICATED_PATCH | Freq: Every day | CUTANEOUS | 0 refills | Status: AC | PRN

## 2024-05-02 MED ORDER — IBUPROFEN 800 MG TABLET: 800.0000 mg | ORAL_TABLET | Freq: Three times a day (TID) | ORAL | 0 refills | Status: AC | PRN

## 2024-05-02 MED ORDER — METHYLPREDNISOLONE 4 MG TABLETS IN A DOSE PACK: ORAL_TABLET | ORAL | 0 refills | Status: AC

## 2024-05-02 MED ORDER — METHOCARBAMOL 750 MG TABLET: 1500.0000 mg | ORAL_TABLET | Freq: Three times a day (TID) | ORAL | 0 refills | Status: AC | PRN

## 2024-05-02 MED ORDER — ORPHENADRINE CITRATE 30 MG/ML INJECTION SOLUTION
60.0000 mg | INTRAMUSCULAR | Status: AC
  Administered 2024-05-02: 60 mg via INTRAMUSCULAR
  Filled 2024-05-02: qty 2

## 2024-05-02 MED ORDER — LIDOCAINE 4 % TOPICAL PATCH
1.0000 | MEDICATED_PATCH | CUTANEOUS | Status: DC
  Administered 2024-05-02: 1 via TRANSDERMAL
  Filled 2024-05-02: qty 1

## 2024-05-02 MED ORDER — DEXAMETHASONE SODIUM PHOSPHATE 10 MG/ML INJECTION SOLUTION
10.0000 mg | INTRAMUSCULAR | Status: AC
  Administered 2024-05-02: 10 mg via INTRAMUSCULAR
  Filled 2024-05-02: qty 1

## 2024-05-02 NOTE — ED Provider Notes (Signed)
 [facility] - Emergency Department  ED Primary Note  History of Present Illness  Bryan [name] is a 49 year old male with a history of spinal surgeries who presents with lower back pain and leg symptoms.    Lower back pain  - Marked increase in bilateral lower lumbar pain over the past day on a background of chronic pain and prior spinal surgeries  - Pain located in the lower lumbar region bilaterally  - No recent trauma, falls, or heavy lifting  - Pain flare possibly triggered by transition from hard bed at recovery center to softer mattress at home    Lower extremity neurological symptoms  - Chronic numbness in the right leg for approximately six months down the lateral right thigh  - New worsening pain in the left leg, worse when lying flat  - Associated needles sensation in both legs  - No groin numbness    Associated symptoms  - No urinary retention or incontinence  - No bowel changes  - No abdominal pain  - No fever  - No recent illness  - No rash    Pain management and medication response  - Regular use of gabapentin, Robaxin , and ibuprofen   - Previously used Vicodin and Percocet, but has avoided narcotics for nearly two years  - Recent steroid course did not improve symptoms  - No medication has provided significant relief    Recent hospital discharge  - Discharged from [facility] four days ago  - Pain worsened after transitioning from hard bed at recovery center to softer bed at home  Physical Exam   ED Triage Vitals [05/02/24 1721]   BP (Non-Invasive) 116/77   Heart Rate (!) 101   Respiratory Rate 20   Temperature 36.7 C (98.1 F)   SpO2 97 %   Weight 117 kg (258 lb 1.6 oz)   Height 1.778 m (5' 10)     Physical Exam  GENERAL: Alert, cooperative, well developed, no acute distress.  HEENT: Normocephalic, normal oropharynx, moist mucous membranes.  EXTREMITIES: No cyanosis or edema.  MUSCULOSKELETAL: Midline lumbar tenderness at scar, right-sided low back  tenderness.  NEUROLOGICAL: Cranial nerves grossly intact, moves all extremities without gross motor or sensory deficit, bilateral lower extremity strength 5/5, sensation intact in lower extremities, patient ambulatory.  SKIN: No rash in lumbar region.  Patient Data   Results  LABS  Comprehensive Metabolic Panel (CMP): Elevated results  Labs Ordered/Reviewed - No data to display  No orders to display     Medical Decision Making          Medical Decision Making  49 year old male with a history of prior lumbar spine surgery presented with acute exacerbation of chronic low back pain, radiating bilaterally with right leg numbness and left leg pain, without recent trauma, fever, or bowel/bladder dysfunction. Examination revealed midline lumbar and right-sided tenderness, preserved lower extremity strength and sensation, and ambulatory status. He is currently taking gabapentin, Robaxin , and ibuprofen , and has a history of Suboxone use for opioid dependence. No evidence of surgical complications, infection, or spinal cord compression was noted on evaluation.    Differential diagnosis includes, but is not limited to:  - Muscular, disc-related, or nerve-related etiologies: Musculoskeletal, discogenic, or neuropathic causes are considered most likely given the chronicity, prior spinal surgery, and absence of red flag symptoms such as bowel/bladder dysfunction or groin numbness.  - Spinal cord compression: Spinal cord compression is less likely due to the absence of groin numbness,  bowel/bladder dysfunction, and preserved strength and sensation in the lower extremities.  - Infection (e.g., epidural abscess, osteomyelitis): Infectious etiologies are highly unlikely given the lack of fever, systemic symptoms, and no evidence of wound complications.  - Hardware malfunction or fracture: Hardware malfunction or fracture is less likely due to the absence of trauma, normal alignment on prior imaging, and no new  deficits.    Exacerbation of chronic low back pain with bilateral sciatica  - Administered intramuscular Toradol , Decadron , and Norflex   - Applied topical lidocaine  patch  - Prescribed ibuprofen , lidocaine  patches, Robaxin , and Medrol  Dosepak  - Advised follow-up with neurosurgery  Medical Decision Making  Risk  OTC drugs.  Prescription drug management.                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

## 2024-05-02 NOTE — ED Nurses Note (Signed)
 Patient discharged home with family.  AVS reviewed with patient/care giver.  A written copy of the AVS and discharge instructions was given to the patient/care giver.  Questions sufficiently answered as needed.  Patient/care giver encouraged to follow up with PCP as indicated.  In the event of an emergency, patient/care giver instructed to call 911 or go to the nearest emergency room.  Lavanda Shams, RN

## 2024-05-02 NOTE — ED Triage Notes (Signed)
 Pt presents ambulatory to the ED with c/o back pain that radiates into hips. He states the pain is in his lower back and extends to both hips, and numbness into right hip. Hx of x3 spinal surgeries. No recent injury. Lavanda Shams, RN

## 2024-05-02 NOTE — Discharge Instructions (Addendum)
 Continue Suboxone.  You may take ibuprofen  800 mg three times daily for pain and inflammation.  You may alternate tylenol  650-1,000 mg every 6 hours as needed for pain.  You may take robaxin  as needed for muscle spasms but this medication may make you drowsy.  Take medrol  dosepak as prescribed.  You may apply ice, heat, icy / hot, biofreeze, and / or lidocaine  patches.  Perform gentle stretching of sore areas.  Return to the ED for numbness of your legs or groin, bowel or bladder retention or incontinence, fever, inability to walk, or any other concerns.

## 2024-05-29 ENCOUNTER — Other Ambulatory Visit (HOSPITAL_COMMUNITY): Payer: Self-pay | Admitting: Physician Assistant

## 2024-05-29 DIAGNOSIS — M25511 Pain in right shoulder: Secondary | ICD-10-CM

## 2024-06-11 ENCOUNTER — Ambulatory Visit (HOSPITAL_COMMUNITY): Payer: Self-pay
